# Patient Record
Sex: Female | Born: 1960 | Race: White | Hispanic: No | Marital: Married | Smoking: Current some day smoker
Health system: Southern US, Community
[De-identification: ages and names within clinical notes are randomized; demographics above are authoritative.]

## PROBLEM LIST (undated history)

## (undated) DIAGNOSIS — F329 Major depressive disorder, single episode, unspecified: Secondary | ICD-10-CM

## (undated) DIAGNOSIS — C801 Malignant (primary) neoplasm, unspecified: Secondary | ICD-10-CM

## (undated) DIAGNOSIS — F32A Depression, unspecified: Secondary | ICD-10-CM

## (undated) HISTORY — PX: APPENDECTOMY: SHX54

## (undated) HISTORY — PX: CHOLECYSTECTOMY: SHX55

## (undated) HISTORY — PX: TONSILLECTOMY: SUR1361

## (undated) HISTORY — PX: ABDOMINAL HYSTERECTOMY: SHX81

## (undated) HISTORY — PX: MANDIBLE SURGERY: SHX707

---

## 2017-07-13 ENCOUNTER — Encounter (HOSPITAL_BASED_OUTPATIENT_CLINIC_OR_DEPARTMENT_OTHER): Payer: Self-pay | Admitting: Emergency Medicine

## 2017-07-13 ENCOUNTER — Other Ambulatory Visit: Payer: Self-pay

## 2017-07-13 ENCOUNTER — Emergency Department (HOSPITAL_BASED_OUTPATIENT_CLINIC_OR_DEPARTMENT_OTHER)
Admission: EM | Admit: 2017-07-13 | Discharge: 2017-07-13 | Disposition: A | Discharge status: Home / Self Care | Payer: Medicare Other | Attending: Emergency Medicine | Admitting: Emergency Medicine

## 2017-07-13 ENCOUNTER — Emergency Department (HOSPITAL_BASED_OUTPATIENT_CLINIC_OR_DEPARTMENT_OTHER): Payer: Medicare Other

## 2017-07-13 DIAGNOSIS — R1084 Generalized abdominal pain: Secondary | ICD-10-CM

## 2017-07-13 DIAGNOSIS — Z79899 Other long term (current) drug therapy: Secondary | ICD-10-CM | POA: Diagnosis not present

## 2017-07-13 DIAGNOSIS — M545 Low back pain, unspecified: Secondary | ICD-10-CM

## 2017-07-13 DIAGNOSIS — Z85819 Personal history of malignant neoplasm of unspecified site of lip, oral cavity, and pharynx: Secondary | ICD-10-CM | POA: Diagnosis not present

## 2017-07-13 DIAGNOSIS — F17200 Nicotine dependence, unspecified, uncomplicated: Secondary | ICD-10-CM | POA: Diagnosis not present

## 2017-07-13 HISTORY — DX: Major depressive disorder, single episode, unspecified: F32.9

## 2017-07-13 HISTORY — DX: Malignant (primary) neoplasm, unspecified: C80.1

## 2017-07-13 HISTORY — DX: Depression, unspecified: F32.A

## 2017-07-13 LAB — URINALYSIS, ROUTINE W REFLEX MICROSCOPIC
Bilirubin Urine: NEGATIVE
Glucose, UA: NEGATIVE mg/dL
Hgb urine dipstick: NEGATIVE
KETONES UR: NEGATIVE mg/dL
LEUKOCYTES UA: NEGATIVE
NITRITE: NEGATIVE
PH: 6 (ref 5.0–8.0)
Protein, ur: NEGATIVE mg/dL
SPECIFIC GRAVITY, URINE: 1.025 (ref 1.005–1.030)

## 2017-07-13 LAB — LIPASE, BLOOD: Lipase: 20 U/L (ref 11–51)

## 2017-07-13 LAB — COMPREHENSIVE METABOLIC PANEL
ALBUMIN: 3.8 g/dL (ref 3.5–5.0)
ALK PHOS: 94 U/L (ref 38–126)
ALT: 19 U/L (ref 14–54)
ANION GAP: 7 (ref 5–15)
AST: 21 U/L (ref 15–41)
BILIRUBIN TOTAL: 0.5 mg/dL (ref 0.3–1.2)
BUN: 13 mg/dL (ref 6–20)
CALCIUM: 8.6 mg/dL — AB (ref 8.9–10.3)
CO2: 26 mmol/L (ref 22–32)
Chloride: 105 mmol/L (ref 101–111)
Creatinine, Ser: 0.74 mg/dL (ref 0.44–1.00)
GFR calc Af Amer: >60 mL/min (ref >60)
GFR calc non Af Amer: >60 mL/min (ref >60)
GLUCOSE: 92 mg/dL (ref 65–99)
Potassium: 4 mmol/L (ref 3.5–5.1)
Sodium: 138 mmol/L (ref 135–145)
TOTAL PROTEIN: 7 g/dL (ref 6.5–8.1)

## 2017-07-13 LAB — CBC WITH DIFFERENTIAL/PLATELET
BASOS PCT: 1 %
Basophils Absolute: 0.1 10*3/uL (ref 0.0–0.1)
Eosinophils Absolute: 0.2 10*3/uL (ref 0.0–0.7)
Eosinophils Relative: 2 %
HEMATOCRIT: 39.2 % (ref 36.0–46.0)
HEMOGLOBIN: 12.5 g/dL (ref 12.0–15.0)
LYMPHS ABS: 2.4 10*3/uL (ref 0.7–4.0)
LYMPHS PCT: 32 %
MCH: 26.7 pg (ref 26.0–34.0)
MCHC: 31.9 g/dL (ref 30.0–36.0)
MCV: 83.6 fL (ref 78.0–100.0)
MONOS PCT: 7 %
Monocytes Absolute: 0.5 10*3/uL (ref 0.1–1.0)
NEUTROS ABS: 4.6 10*3/uL (ref 1.7–7.7)
NEUTROS PCT: 58 %
Platelets: 315 10*3/uL (ref 150–400)
RBC: 4.69 MIL/uL (ref 3.87–5.11)
RDW: 13.4 % (ref 11.5–15.5)
WBC: 7.7 10*3/uL (ref 4.0–10.5)

## 2017-07-13 MED ORDER — TRAMADOL HCL 50 MG PO TABS: 50.0000 mg | ORAL_TABLET | Freq: Four times a day (QID) | ORAL | 0 refills | Status: AC | PRN

## 2017-07-13 MED ORDER — HYDROMORPHONE HCL 1 MG/ML IJ SOLN
0.5000 mg | Freq: Once | INTRAMUSCULAR | Status: AC
  Administered 2017-07-13: 0.5 mg via INTRAVENOUS
  Filled 2017-07-13: qty 1

## 2017-07-13 MED ORDER — SODIUM CHLORIDE 0.9 % IV BOLUS (SEPSIS)
500.0000 mL | Freq: Once | INTRAVENOUS | Status: AC
  Administered 2017-07-13: 500 mL via INTRAVENOUS

## 2017-07-13 MED ORDER — ONDANSETRON HCL 4 MG/2ML IJ SOLN
4.0000 mg | Freq: Once | INTRAMUSCULAR | Status: AC
  Administered 2017-07-13: 4 mg via INTRAVENOUS
  Filled 2017-07-13: qty 2

## 2017-07-13 MED ORDER — IOPAMIDOL (ISOVUE-300) INJECTION 61%
100.0000 mL | Freq: Once | INTRAVENOUS | Status: AC | PRN
  Administered 2017-07-13: 100 mL via INTRAVENOUS

## 2017-07-13 NOTE — ED Triage Notes (Signed)
Patient reports that she started to have lower back pain about noon yesterday  - the patient reports that she woke up this am and she has the pain concentrated on her right lower back to her right lower abdominal regions. Patient denies any n/V  - reports that she has decreased urine output this am however no burning

## 2017-07-13 NOTE — Discharge Instructions (Signed)
Please read and follow all provided instructions.  Your diagnoses today include:  1. Generalized abdominal pain   2. Acute right-sided low back pain without sciatica     Tests performed today include:  Blood counts and electrolytes  Blood tests to check liver and kidney function  Blood tests to check pancreas function  Urine test to look for infection  CT scan - no acute problems in your abdomen  Vital signs. See below for your results today.   Medications prescribed:   Tramadol - narcotic-like pain medication  DO NOT drive or perform any activities that require you to be awake and alert because this medicine can make you drowsy.   Take any prescribed medications only as directed.  Home care instructions:   Follow any educational materials contained in this packet.  Follow-up instructions: Please follow-up with your primary care provider in the next 3 days for further evaluation of your symptoms.    Return instructions:  SEEK IMMEDIATE MEDICAL ATTENTION IF:  The pain does not go away or becomes severe   A temperature above 101F develops   Repeated vomiting occurs (multiple episodes)   The pain becomes localized to portions of the abdomen. The right side could possibly be appendicitis. In an adult, the left lower portion of the abdomen could be colitis or diverticulitis.   Blood is being passed in stools or vomit (bright red or black tarry stools)   You develop chest pain, difficulty breathing, dizziness or fainting, or become confused, poorly responsive, or inconsolable (young children)  If you have any other emergent concerns regarding your health  Additional Information: Abdominal (belly) pain can be caused by many things. Your caregiver performed an examination and possibly ordered blood/urine tests and imaging (CT scan, x-rays, ultrasound). Many cases can be observed and treated at home after initial evaluation in the emergency department. Even though you are  being discharged home, abdominal pain can be unpredictable. Therefore, you need a repeated exam if your pain does not resolve, returns, or worsens. Most patients with abdominal pain don't have to be admitted to the hospital or have surgery, but serious problems like appendicitis and gallbladder attacks can start out as nonspecific pain. Many abdominal conditions cannot be diagnosed in one visit, so follow-up evaluations are very important.  Your vital signs today were: BP 135/65 (BP Location: Right Arm)    Pulse 75    Temp 97.6 F (36.4 C) (Oral)    Resp 18    Ht 5' (1.524 m)    Wt 54.4 kg (120 lb)    SpO2 100%    BMI 23.44 kg/m  If your blood pressure (bp) was elevated above 135/85 this visit, please have this repeated by your doctor within one month. --------------

## 2017-07-13 NOTE — ED Provider Notes (Signed)
[facility] EMERGENCY DEPARTMENT Provider Note   CSN: [id number] Arrival date & time: 07/13/17  2423     History   Chief Complaint Chief Complaint  Patient presents with  . Back Pain    HPI [name] is a 57 y.o. female.  Patient with history of cholecystectomy, appendectomy, hysterectomy --presents to the emergency department today with complaint of right lower back pain with radiation into her abdomen.  Pain is across her entire abdomen but worse on the right.  She has had no associated fevers, nausea, vomiting, constipation.  Patient notes some loose nonbloody stools today.  No urinary symptoms including blood or pain.  No vaginal bleeding or discharge.  She has not had pain like this in the past.  Pain is severe at times.  She has been taking Tylenol at home without relief. The onset of this condition was acute. The course is constant. Aggravating factors: movement and palpation. Alleviating factors: none.        Past Medical History:  Diagnosis Date  . 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 Depression     There are no active problems to display for this patient.   Past Surgical History:  Procedure Laterality Date  . ABDOMINAL HYSTERECTOMY    . APPENDECTOMY    . CHOLECYSTECTOMY    . MANDIBLE SURGERY    . TONSILLECTOMY      OB History    No data available       Home Medications    Prior to Admission medications   Medication Sig Start Date End Date Taking? Authorizing Provider  clonazePAM (KLONOPIN) 1 MG tablet Take 1 mg by mouth 2 (two) times daily.   Yes [provider]  desvenlafaxine (PRISTIQ) 50 MG 24 hr tablet Take 50 mg by mouth daily.   Yes [provider]  temazepam (RESTORIL) 15 MG capsule Take 30 mg by mouth at bedtime as needed for sleep.   Yes [provider]    Family History History reviewed. No pertinent family history.  Social History Social History   Tobacco Use  . Smoking status: Current Some Day  Smoker  . Smokeless tobacco: Never Used  Substance Use Topics  . Alcohol use: No    Frequency: Never  . Drug use: No     Allergies   Sulfa antibiotics and Codeine   Review of Systems Review of Systems  Constitutional: Negative for fever.  HENT: Negative for rhinorrhea and sore throat.   Eyes: Negative for redness.  Respiratory: Negative for cough.   Cardiovascular: Negative for chest pain.  Gastrointestinal: Positive for abdominal pain and diarrhea. Negative for blood in stool, nausea and vomiting.  Genitourinary: Negative for dysuria.  Musculoskeletal: Positive for back pain. Negative for myalgias.  Skin: Negative for rash.  Neurological: Negative for headaches.     Physical Exam Updated Vital Signs BP 135/65 (BP Location: Right Arm)   Pulse 75   Temp 97.6 F (36.4 C) (Oral)   Resp 18   Ht 5' (1.524 m)   Wt 54.4 kg (120 lb)   SpO2 100%   BMI 23.44 kg/m   Physical Exam  Constitutional: She appears well-developed and well-nourished.  HENT:  Head: Normocephalic and atraumatic.  Mouth/Throat: Oropharynx is clear and moist.  Eyes: Conjunctivae are normal. Right eye exhibits no discharge. Left eye exhibits no discharge.  Neck: Normal range of motion. Neck supple.  Cardiovascular: Normal rate, regular rhythm and normal heart sounds.  No murmur heard. Pulmonary/Chest:  Effort normal and breath sounds normal. No stridor. No respiratory distress. She has no wheezes.  Abdominal: Soft. She exhibits no mass. There is tenderness. There is no guarding.  Tenderness is generalized but patient reports it seems to be worse in the right lower quadrant.  No rebound or guarding.  Tenderness is mild to moderate.  Neurological: She is alert.  Skin: Skin is warm and dry.  Psychiatric: She has a normal mood and affect.  Nursing note and vitals reviewed.    ED Treatments / Results  Labs (all labs ordered are listed, but only abnormal results are displayed) Labs Reviewed    COMPREHENSIVE METABOLIC PANEL - Abnormal; Notable for the following components:      Result Value   Calcium 8.6 (*)    All other components within normal limits  URINALYSIS, ROUTINE W REFLEX MICROSCOPIC  CBC WITH DIFFERENTIAL/PLATELET  LIPASE, BLOOD    EKG  EKG Interpretation None       Radiology Ct Abdomen Pelvis W Contrast  Result Date: 07/13/2017 CLINICAL DATA:  Right side abdominal pain, flank pain EXAM: CT ABDOMEN AND PELVIS WITH CONTRAST TECHNIQUE: Multidetector CT imaging of the abdomen and pelvis was performed using the standard protocol following bolus administration of intravenous contrast. CONTRAST:  135mL ISOVUE-300 IOPAMIDOL (ISOVUE-300) INJECTION 61% COMPARISON:  None. FINDINGS: Lower chest: No acute abnormality. Hepatobiliary: Prior cholecystectomy. Mildly prominent intrahepatic and extrahepatic biliary ducts compatible with post cholecystectomy state. No focal hepatic abnormality. Pancreas: No focal abnormality or ductal dilatation. Spleen: No focal abnormality.  Normal size. Adrenals/Urinary Tract: No adrenal abnormality. No focal renal abnormality. No stones or hydronephrosis. Urinary bladder is unremarkable. Stomach/Bowel: Stomach, large and small bowel grossly unremarkable. Vascular/Lymphatic: No evidence of aneurysm or adenopathy. Reproductive: Prior hysterectomy.  No adnexal masses. Other: No free fluid or free air. Musculoskeletal: No acute bony abnormality. IMPRESSION: Prior cholecystectomy, appendectomy and hysterectomy. No acute findings in the abdomen or pelvis. Electronically Signed   By: Rolm Baptise M.D.   On: 07/13/2017 10:31    Procedures Procedures (including critical care time)  Medications Ordered in ED Medications  HYDROmorphone (DILAUDID) injection 0.5 mg (not administered)  HYDROmorphone (DILAUDID) injection 0.5 mg (0.5 mg Intravenous Given 07/13/17 0931)  ondansetron (ZOFRAN) injection 4 mg (4 mg Intravenous Given 07/13/17 0931)  sodium chloride  0.9 % bolus 500 mL (0 mLs Intravenous Stopped 07/13/17 1021)  iopamidol (ISOVUE-300) 61 % injection 100 mL (100 mLs Intravenous Contrast Given 07/13/17 1009)     Initial Impression / Assessment and Plan / ED Course  I have reviewed the triage vital signs and the nursing notes.  Pertinent labs & imaging results that were available during my care of the patient were reviewed by me and considered in my medical decision making (see chart for details).     Patient seen and examined.  Patient with previous appendectomy and cholecystectomy.  She is not pregnant due to previous hysterectomy.  Pain is nonlocalized and patient is tender across her abdomen.  CT imaging ordered to further delineate given nonfocal exam.  Pain began in the back, however patient's abdominal tenderness would be atypical for a lower back injury.  Labs pending.  Vital signs reviewed and are as follows: BP 135/65 (BP Location: Right Arm)   Pulse 75   Temp 97.6 F (36.4 C) (Oral)   Resp 18   Ht 5' (1.524 m)   Wt 54.4 kg (120 lb)   SpO2 100%   BMI 23.44 kg/m   10:46 AM patient updated on  lab and CT results.  Workup is reassuring. She continues to have some pain. Another 0.5mg  dilaudid prior to discharge. Home with tramadol. Reviewed database, mainly benzo rx. She has had tramadol in the past.   The patient was urged to return to the Emergency Department immediately with worsening of current symptoms, worsening abdominal pain, persistent vomiting, blood noted in stools, fever, or any other concerns. The patient verbalized understanding.   Encouraged primary care follow-up for recheck in the next 2-3 days.  Patient counseled on use of narcotic pain medications. Counseled not to combine these medications with others containing tylenol. Urged not to drink alcohol, drive, or perform any other activities that requires focus while taking these medications. The patient verbalizes understanding and agrees with the plan.   Final  Clinical Impressions(s) / ED Diagnoses   Final diagnoses:  Generalized abdominal pain  Acute right-sided low back pain without sciatica   Patient with abdominal pain and back pain. Vitals are stable, no fever. Labs reassuring. Imaging negative. Possibly MSK pain with atypical features. No signs of dehydration, patient is tolerating PO's. Lungs are clear and no signs suggestive of PNA. Low concern for appendicitis, cholecystitis (previous sx), pancreatitis, ruptured viscus, UTI, kidney stone, aortic dissection, aortic aneurysm or other emergent abdominal etiology. Supportive therapy indicated with return if symptoms worsen.    ED Discharge Orders        Ordered    traMADol (ULTRAM) 50 MG tablet  Every 6 hours PRN     07/13/17 1043       Carlisle Cater, PA-C 07/13/17 1048    Drenda Freeze, MD 07/13/17 1440

## 2018-05-11 DIAGNOSIS — E872 Acidosis: Secondary | ICD-10-CM | POA: Diagnosis not present

## 2018-05-11 DIAGNOSIS — G8918 Other acute postprocedural pain: Secondary | ICD-10-CM | POA: Diagnosis not present

## 2018-05-11 DIAGNOSIS — Z7982 Long term (current) use of aspirin: Secondary | ICD-10-CM | POA: Diagnosis not present

## 2018-05-11 DIAGNOSIS — C069 Malignant neoplasm of mouth, unspecified: Secondary | ICD-10-CM | POA: Diagnosis not present

## 2018-05-11 DIAGNOSIS — Z4682 Encounter for fitting and adjustment of non-vascular catheter: Secondary | ICD-10-CM | POA: Diagnosis not present

## 2018-05-11 DIAGNOSIS — Z9889 Other specified postprocedural states: Secondary | ICD-10-CM | POA: Diagnosis not present

## 2018-05-11 DIAGNOSIS — Z885 Allergy status to narcotic agent status: Secondary | ICD-10-CM | POA: Diagnosis not present

## 2018-05-11 DIAGNOSIS — I4581 Long QT syndrome: Secondary | ICD-10-CM | POA: Diagnosis not present

## 2018-05-11 DIAGNOSIS — R739 Hyperglycemia, unspecified: Secondary | ICD-10-CM | POA: Diagnosis not present

## 2018-05-11 DIAGNOSIS — Z85819 Personal history of malignant neoplasm of unspecified site of lip, oral cavity, and pharynx: Secondary | ICD-10-CM | POA: Diagnosis not present

## 2018-05-11 DIAGNOSIS — C001 Malignant neoplasm of external lower lip: Secondary | ICD-10-CM | POA: Diagnosis not present

## 2018-05-11 DIAGNOSIS — Z882 Allergy status to sulfonamides status: Secondary | ICD-10-CM | POA: Diagnosis not present

## 2018-05-11 DIAGNOSIS — D649 Anemia, unspecified: Secondary | ICD-10-CM | POA: Diagnosis not present

## 2018-05-11 DIAGNOSIS — J9811 Atelectasis: Secondary | ICD-10-CM | POA: Diagnosis not present

## 2018-05-11 DIAGNOSIS — Z87891 Personal history of nicotine dependence: Secondary | ICD-10-CM | POA: Diagnosis not present

## 2018-05-11 DIAGNOSIS — R1311 Dysphagia, oral phase: Secondary | ICD-10-CM | POA: Diagnosis not present

## 2018-05-11 DIAGNOSIS — Z85818 Personal history of malignant neoplasm of other sites of lip, oral cavity, and pharynx: Secondary | ICD-10-CM | POA: Diagnosis not present

## 2018-05-11 DIAGNOSIS — R9431 Abnormal electrocardiogram [ECG] [EKG]: Secondary | ICD-10-CM | POA: Diagnosis not present

## 2018-05-11 DIAGNOSIS — R0689 Other abnormalities of breathing: Secondary | ICD-10-CM | POA: Diagnosis not present

## 2018-05-11 DIAGNOSIS — Z01818 Encounter for other preprocedural examination: Secondary | ICD-10-CM | POA: Diagnosis not present

## 2018-05-11 DIAGNOSIS — R6510 Systemic inflammatory response syndrome (SIRS) of non-infectious origin without acute organ dysfunction: Secondary | ICD-10-CM | POA: Diagnosis not present

## 2018-05-11 DIAGNOSIS — Z9049 Acquired absence of other specified parts of digestive tract: Secondary | ICD-10-CM | POA: Diagnosis not present

## 2018-05-11 DIAGNOSIS — K219 Gastro-esophageal reflux disease without esophagitis: Secondary | ICD-10-CM | POA: Diagnosis not present

## 2018-05-11 DIAGNOSIS — K130 Diseases of lips: Secondary | ICD-10-CM | POA: Diagnosis not present

## 2018-05-11 DIAGNOSIS — M952 Other acquired deformity of head: Secondary | ICD-10-CM | POA: Diagnosis not present

## 2018-05-19 DIAGNOSIS — Z87891 Personal history of nicotine dependence: Secondary | ICD-10-CM | POA: Diagnosis not present

## 2018-05-19 DIAGNOSIS — Z8719 Personal history of other diseases of the digestive system: Secondary | ICD-10-CM | POA: Diagnosis not present

## 2018-05-19 DIAGNOSIS — Z882 Allergy status to sulfonamides status: Secondary | ICD-10-CM | POA: Diagnosis not present

## 2018-05-19 DIAGNOSIS — K130 Diseases of lips: Secondary | ICD-10-CM | POA: Diagnosis not present

## 2018-05-19 DIAGNOSIS — Z885 Allergy status to narcotic agent status: Secondary | ICD-10-CM | POA: Diagnosis not present

## 2018-06-03 DIAGNOSIS — Z882 Allergy status to sulfonamides status: Secondary | ICD-10-CM | POA: Diagnosis not present

## 2018-06-03 DIAGNOSIS — Z885 Allergy status to narcotic agent status: Secondary | ICD-10-CM | POA: Diagnosis not present

## 2018-06-03 DIAGNOSIS — Z7951 Long term (current) use of inhaled steroids: Secondary | ICD-10-CM | POA: Diagnosis not present

## 2018-06-03 DIAGNOSIS — M79605 Pain in left leg: Secondary | ICD-10-CM | POA: Diagnosis not present

## 2018-06-03 DIAGNOSIS — T8141XA Infection following a procedure, superficial incisional surgical site, initial encounter: Secondary | ICD-10-CM | POA: Diagnosis not present

## 2018-06-03 DIAGNOSIS — Z87891 Personal history of nicotine dependence: Secondary | ICD-10-CM | POA: Diagnosis not present

## 2018-06-03 DIAGNOSIS — T148XXA Other injury of unspecified body region, initial encounter: Secondary | ICD-10-CM | POA: Diagnosis not present

## 2018-06-03 DIAGNOSIS — Z79899 Other long term (current) drug therapy: Secondary | ICD-10-CM | POA: Diagnosis not present

## 2018-06-03 DIAGNOSIS — L539 Erythematous condition, unspecified: Secondary | ICD-10-CM | POA: Diagnosis not present

## 2018-06-03 DIAGNOSIS — Z85828 Personal history of other malignant neoplasm of skin: Secondary | ICD-10-CM | POA: Diagnosis not present

## 2018-06-03 DIAGNOSIS — L03116 Cellulitis of left lower limb: Secondary | ICD-10-CM | POA: Diagnosis not present

## 2018-07-01 DIAGNOSIS — J0100 Acute maxillary sinusitis, unspecified: Secondary | ICD-10-CM | POA: Diagnosis not present

## 2018-07-01 DIAGNOSIS — J209 Acute bronchitis, unspecified: Secondary | ICD-10-CM | POA: Diagnosis not present

## 2018-08-27 DIAGNOSIS — Z79899 Other long term (current) drug therapy: Secondary | ICD-10-CM | POA: Diagnosis not present

## 2018-08-27 DIAGNOSIS — Z882 Allergy status to sulfonamides status: Secondary | ICD-10-CM | POA: Diagnosis not present

## 2018-08-27 DIAGNOSIS — Z885 Allergy status to narcotic agent status: Secondary | ICD-10-CM | POA: Diagnosis not present

## 2018-08-27 DIAGNOSIS — K130 Diseases of lips: Secondary | ICD-10-CM | POA: Diagnosis not present

## 2018-08-27 DIAGNOSIS — C009 Malignant neoplasm of lip, unspecified: Secondary | ICD-10-CM | POA: Diagnosis not present

## 2018-08-27 DIAGNOSIS — Z9889 Other specified postprocedural states: Secondary | ICD-10-CM | POA: Diagnosis not present

## 2018-11-25 DIAGNOSIS — Z85819 Personal history of malignant neoplasm of unspecified site of lip, oral cavity, and pharynx: Secondary | ICD-10-CM | POA: Diagnosis not present

## 2018-11-25 DIAGNOSIS — Z01818 Encounter for other preprocedural examination: Secondary | ICD-10-CM | POA: Diagnosis not present

## 2018-11-25 DIAGNOSIS — R1311 Dysphagia, oral phase: Secondary | ICD-10-CM | POA: Diagnosis not present

## 2018-11-25 DIAGNOSIS — M952 Other acquired deformity of head: Secondary | ICD-10-CM | POA: Diagnosis not present

## 2018-12-01 DIAGNOSIS — Z419 Encounter for procedure for purposes other than remedying health state, unspecified: Secondary | ICD-10-CM | POA: Diagnosis not present

## 2018-12-04 DIAGNOSIS — Z7289 Other problems related to lifestyle: Secondary | ICD-10-CM | POA: Diagnosis not present

## 2018-12-04 DIAGNOSIS — Z87891 Personal history of nicotine dependence: Secondary | ICD-10-CM | POA: Diagnosis not present

## 2018-12-04 DIAGNOSIS — Z79899 Other long term (current) drug therapy: Secondary | ICD-10-CM | POA: Diagnosis not present

## 2018-12-04 DIAGNOSIS — M952 Other acquired deformity of head: Secondary | ICD-10-CM | POA: Diagnosis not present

## 2018-12-04 DIAGNOSIS — K130 Diseases of lips: Secondary | ICD-10-CM | POA: Diagnosis not present

## 2018-12-04 DIAGNOSIS — Z9889 Other specified postprocedural states: Secondary | ICD-10-CM | POA: Diagnosis not present

## 2018-12-04 DIAGNOSIS — M278 Other specified diseases of jaws: Secondary | ICD-10-CM | POA: Diagnosis not present

## 2018-12-04 DIAGNOSIS — C009 Malignant neoplasm of lip, unspecified: Secondary | ICD-10-CM | POA: Diagnosis not present

## 2018-12-04 DIAGNOSIS — R1311 Dysphagia, oral phase: Secondary | ICD-10-CM | POA: Diagnosis not present

## 2018-12-04 DIAGNOSIS — Z85819 Personal history of malignant neoplasm of unspecified site of lip, oral cavity, and pharynx: Secondary | ICD-10-CM | POA: Diagnosis not present

## 2018-12-04 DIAGNOSIS — L7682 Other postprocedural complications of skin and subcutaneous tissue: Secondary | ICD-10-CM | POA: Diagnosis not present

## 2018-12-05 DIAGNOSIS — Z9889 Other specified postprocedural states: Secondary | ICD-10-CM | POA: Diagnosis not present

## 2018-12-05 DIAGNOSIS — Z79899 Other long term (current) drug therapy: Secondary | ICD-10-CM | POA: Diagnosis not present

## 2018-12-05 DIAGNOSIS — Z87891 Personal history of nicotine dependence: Secondary | ICD-10-CM | POA: Diagnosis not present

## 2018-12-05 DIAGNOSIS — M278 Other specified diseases of jaws: Secondary | ICD-10-CM | POA: Diagnosis not present

## 2018-12-05 DIAGNOSIS — Z7289 Other problems related to lifestyle: Secondary | ICD-10-CM | POA: Diagnosis not present

## 2018-12-05 DIAGNOSIS — M952 Other acquired deformity of head: Secondary | ICD-10-CM | POA: Diagnosis not present

## 2018-12-05 DIAGNOSIS — K130 Diseases of lips: Secondary | ICD-10-CM | POA: Diagnosis not present

## 2018-12-05 DIAGNOSIS — L7682 Other postprocedural complications of skin and subcutaneous tissue: Secondary | ICD-10-CM | POA: Diagnosis not present

## 2018-12-30 DIAGNOSIS — J0100 Acute maxillary sinusitis, unspecified: Secondary | ICD-10-CM | POA: Diagnosis not present

## 2018-12-30 DIAGNOSIS — J029 Acute pharyngitis, unspecified: Secondary | ICD-10-CM | POA: Diagnosis not present

## 2019-02-17 DIAGNOSIS — Z01818 Encounter for other preprocedural examination: Secondary | ICD-10-CM | POA: Diagnosis not present

## 2019-02-23 DIAGNOSIS — Z01818 Encounter for other preprocedural examination: Secondary | ICD-10-CM | POA: Diagnosis not present

## 2019-02-26 DIAGNOSIS — Z9889 Other specified postprocedural states: Secondary | ICD-10-CM | POA: Diagnosis not present

## 2019-02-26 DIAGNOSIS — Z85819 Personal history of malignant neoplasm of unspecified site of lip, oral cavity, and pharynx: Secondary | ICD-10-CM | POA: Diagnosis not present

## 2019-02-26 DIAGNOSIS — Z85828 Personal history of other malignant neoplasm of skin: Secondary | ICD-10-CM | POA: Diagnosis not present

## 2019-02-26 DIAGNOSIS — Z428 Encounter for other plastic and reconstructive surgery following medical procedure or healed injury: Secondary | ICD-10-CM | POA: Diagnosis not present

## 2019-02-26 DIAGNOSIS — K130 Diseases of lips: Secondary | ICD-10-CM | POA: Diagnosis not present

## 2019-02-26 DIAGNOSIS — R1311 Dysphagia, oral phase: Secondary | ICD-10-CM | POA: Diagnosis not present

## 2019-02-26 DIAGNOSIS — M953 Acquired deformity of neck: Secondary | ICD-10-CM | POA: Diagnosis not present

## 2019-02-26 DIAGNOSIS — M2689 Other dentofacial anomalies: Secondary | ICD-10-CM | POA: Diagnosis not present

## 2019-02-26 DIAGNOSIS — M952 Other acquired deformity of head: Secondary | ICD-10-CM | POA: Diagnosis not present

## 2019-06-10 ENCOUNTER — Institutional Professional Consult (permissible substitution): Payer: Medicare Other | Admitting: Plastic Surgery

## 2019-06-21 DIAGNOSIS — J0100 Acute maxillary sinusitis, unspecified: Secondary | ICD-10-CM | POA: Diagnosis not present

## 2019-06-21 DIAGNOSIS — H9201 Otalgia, right ear: Secondary | ICD-10-CM | POA: Diagnosis not present

## 2019-06-22 DIAGNOSIS — J0100 Acute maxillary sinusitis, unspecified: Secondary | ICD-10-CM | POA: Diagnosis not present

## 2019-07-06 DIAGNOSIS — J029 Acute pharyngitis, unspecified: Secondary | ICD-10-CM | POA: Diagnosis not present

## 2019-07-06 DIAGNOSIS — R05 Cough: Secondary | ICD-10-CM | POA: Diagnosis not present

## 2019-07-06 DIAGNOSIS — Z03818 Encounter for observation for suspected exposure to other biological agents ruled out: Secondary | ICD-10-CM | POA: Diagnosis not present

## 2019-07-06 DIAGNOSIS — H9201 Otalgia, right ear: Secondary | ICD-10-CM | POA: Diagnosis not present

## 2019-07-24 DIAGNOSIS — Z85819 Personal history of malignant neoplasm of unspecified site of lip, oral cavity, and pharynx: Secondary | ICD-10-CM | POA: Diagnosis not present

## 2019-07-24 DIAGNOSIS — Z85818 Personal history of malignant neoplasm of other sites of lip, oral cavity, and pharynx: Secondary | ICD-10-CM | POA: Diagnosis not present

## 2019-07-27 DIAGNOSIS — K1329 Other disturbances of oral epithelium, including tongue: Secondary | ICD-10-CM | POA: Diagnosis not present

## 2019-08-01 DIAGNOSIS — K1379 Other lesions of oral mucosa: Secondary | ICD-10-CM | POA: Diagnosis not present

## 2019-08-01 DIAGNOSIS — Z87891 Personal history of nicotine dependence: Secondary | ICD-10-CM | POA: Diagnosis not present

## 2019-08-01 DIAGNOSIS — Z79899 Other long term (current) drug therapy: Secondary | ICD-10-CM | POA: Diagnosis not present

## 2019-08-01 DIAGNOSIS — R6884 Jaw pain: Secondary | ICD-10-CM | POA: Diagnosis not present

## 2019-08-01 DIAGNOSIS — Z882 Allergy status to sulfonamides status: Secondary | ICD-10-CM | POA: Diagnosis not present

## 2019-08-01 DIAGNOSIS — Z885 Allergy status to narcotic agent status: Secondary | ICD-10-CM | POA: Diagnosis not present

## 2019-08-23 DIAGNOSIS — M952 Other acquired deformity of head: Secondary | ICD-10-CM | POA: Diagnosis not present

## 2019-09-04 DIAGNOSIS — Z87891 Personal history of nicotine dependence: Secondary | ICD-10-CM | POA: Diagnosis not present

## 2019-09-04 DIAGNOSIS — K0889 Other specified disorders of teeth and supporting structures: Secondary | ICD-10-CM | POA: Diagnosis not present

## 2019-09-04 DIAGNOSIS — Z882 Allergy status to sulfonamides status: Secondary | ICD-10-CM | POA: Diagnosis not present

## 2019-09-04 DIAGNOSIS — Z888 Allergy status to other drugs, medicaments and biological substances status: Secondary | ICD-10-CM | POA: Diagnosis not present

## 2019-09-04 DIAGNOSIS — Z79899 Other long term (current) drug therapy: Secondary | ICD-10-CM | POA: Diagnosis not present

## 2019-09-13 DIAGNOSIS — C031 Malignant neoplasm of lower gum: Secondary | ICD-10-CM | POA: Diagnosis not present

## 2019-09-16 DIAGNOSIS — C030 Malignant neoplasm of upper gum: Secondary | ICD-10-CM | POA: Diagnosis not present

## 2019-09-18 DIAGNOSIS — Z881 Allergy status to other antibiotic agents status: Secondary | ICD-10-CM | POA: Diagnosis not present

## 2019-09-18 DIAGNOSIS — Z87891 Personal history of nicotine dependence: Secondary | ICD-10-CM | POA: Diagnosis not present

## 2019-09-18 DIAGNOSIS — Z79899 Other long term (current) drug therapy: Secondary | ICD-10-CM | POA: Diagnosis not present

## 2019-09-18 DIAGNOSIS — Z888 Allergy status to other drugs, medicaments and biological substances status: Secondary | ICD-10-CM | POA: Diagnosis not present

## 2019-09-18 DIAGNOSIS — Z886 Allergy status to analgesic agent status: Secondary | ICD-10-CM | POA: Diagnosis not present

## 2019-09-18 DIAGNOSIS — R031 Nonspecific low blood-pressure reading: Secondary | ICD-10-CM | POA: Diagnosis not present

## 2019-09-18 DIAGNOSIS — K0889 Other specified disorders of teeth and supporting structures: Secondary | ICD-10-CM | POA: Diagnosis not present

## 2019-09-28 DIAGNOSIS — C411 Malignant neoplasm of mandible: Secondary | ICD-10-CM | POA: Diagnosis not present

## 2019-09-28 DIAGNOSIS — C030 Malignant neoplasm of upper gum: Secondary | ICD-10-CM | POA: Diagnosis not present

## 2019-09-28 DIAGNOSIS — Z9049 Acquired absence of other specified parts of digestive tract: Secondary | ICD-10-CM | POA: Diagnosis not present

## 2019-09-28 DIAGNOSIS — Z803 Family history of malignant neoplasm of breast: Secondary | ICD-10-CM | POA: Diagnosis not present

## 2019-09-28 DIAGNOSIS — Z885 Allergy status to narcotic agent status: Secondary | ICD-10-CM | POA: Diagnosis not present

## 2019-09-28 DIAGNOSIS — Z882 Allergy status to sulfonamides status: Secondary | ICD-10-CM | POA: Diagnosis not present

## 2019-09-28 DIAGNOSIS — Z87891 Personal history of nicotine dependence: Secondary | ICD-10-CM | POA: Diagnosis not present

## 2019-09-28 DIAGNOSIS — R200 Anesthesia of skin: Secondary | ICD-10-CM | POA: Diagnosis not present

## 2019-09-28 DIAGNOSIS — K1370 Unspecified lesions of oral mucosa: Secondary | ICD-10-CM | POA: Diagnosis not present

## 2019-09-29 DIAGNOSIS — C411 Malignant neoplasm of mandible: Secondary | ICD-10-CM | POA: Diagnosis not present

## 2019-10-10 DIAGNOSIS — L661 Lichen planopilaris: Secondary | ICD-10-CM | POA: Diagnosis not present

## 2019-10-10 DIAGNOSIS — C44121 Squamous cell carcinoma of skin of unspecified eyelid, including canthus: Secondary | ICD-10-CM | POA: Diagnosis not present

## 2019-10-10 DIAGNOSIS — C441292 Squamous cell carcinoma of skin of left lower eyelid, including canthus: Secondary | ICD-10-CM | POA: Diagnosis not present

## 2019-10-10 DIAGNOSIS — G893 Neoplasm related pain (acute) (chronic): Secondary | ICD-10-CM | POA: Diagnosis not present

## 2019-10-10 DIAGNOSIS — C039 Malignant neoplasm of gum, unspecified: Secondary | ICD-10-CM | POA: Diagnosis not present

## 2019-10-10 DIAGNOSIS — Z87891 Personal history of nicotine dependence: Secondary | ICD-10-CM | POA: Diagnosis not present

## 2019-10-11 DIAGNOSIS — Z7189 Other specified counseling: Secondary | ICD-10-CM | POA: Diagnosis not present

## 2019-10-13 DIAGNOSIS — Z9049 Acquired absence of other specified parts of digestive tract: Secondary | ICD-10-CM | POA: Diagnosis not present

## 2019-10-13 DIAGNOSIS — K219 Gastro-esophageal reflux disease without esophagitis: Secondary | ICD-10-CM | POA: Diagnosis not present

## 2019-10-13 DIAGNOSIS — C030 Malignant neoplasm of upper gum: Secondary | ICD-10-CM | POA: Diagnosis not present

## 2019-10-13 DIAGNOSIS — G4730 Sleep apnea, unspecified: Secondary | ICD-10-CM | POA: Diagnosis not present

## 2019-10-22 IMAGING — CT CT ABD-PELV W/ CM
2 of 5 series · 16 of 46 positions shown, 18 images · IV contrast (APPLIED)
Comparison: None.

CLINICAL DATA: Right side abdominal pain, flank pain

EXAM:
CT ABDOMEN AND PELVIS WITH CONTRAST
TECHNIQUE: Multidetector CT imaging of the abdomen and pelvis was performed
using the standard protocol following bolus administration of
intravenous contrast.
CONTRAST:  100mL UQF6VL-0SS IOPAMIDOL (UQF6VL-0SS) INJECTION 61%

[Series 2: axial st · axial · 0.68mm/px · z∈[-444,-50]mm · 13 of 89 slices shown, 15 images]
[im 5/89  soft-tissue]
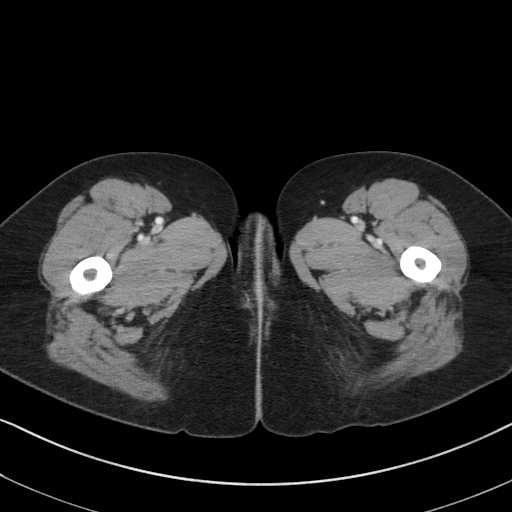
[im 5/89  bone]
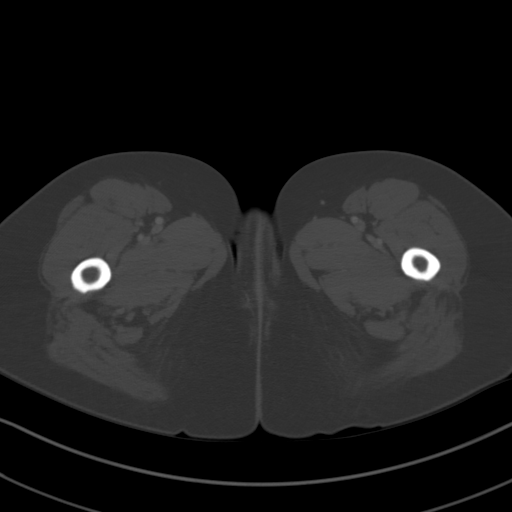
[im 14/89  soft-tissue]
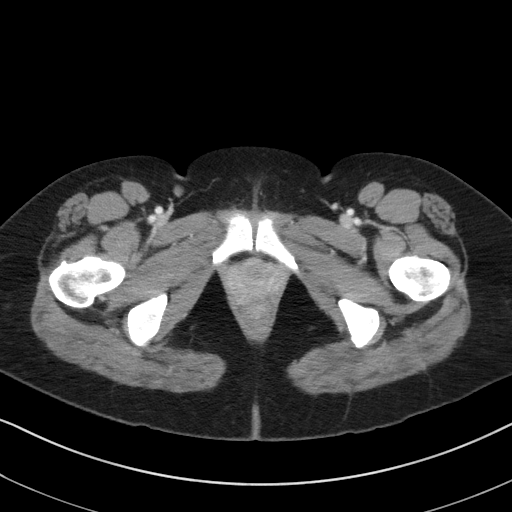
[im 19/89  soft-tissue]
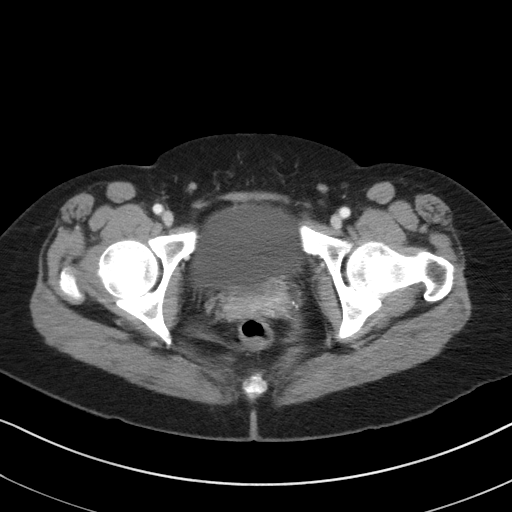
[im 24/89  soft-tissue]
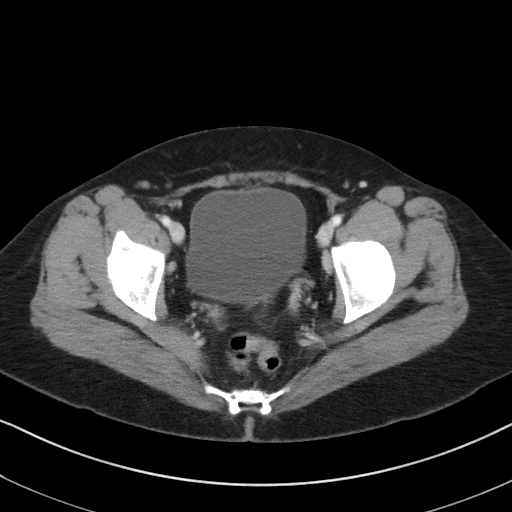
[im 33/89  soft-tissue]
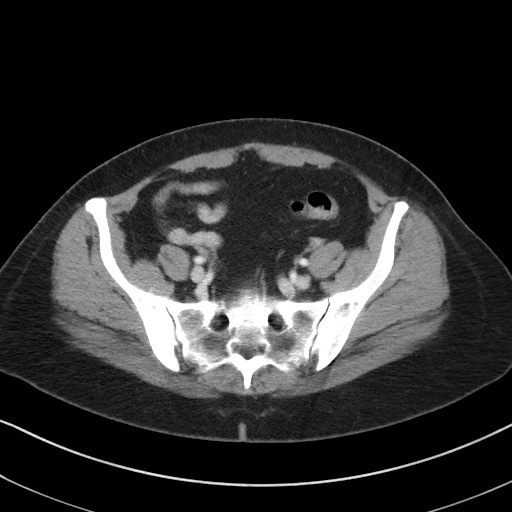
[im 38/89  soft-tissue]
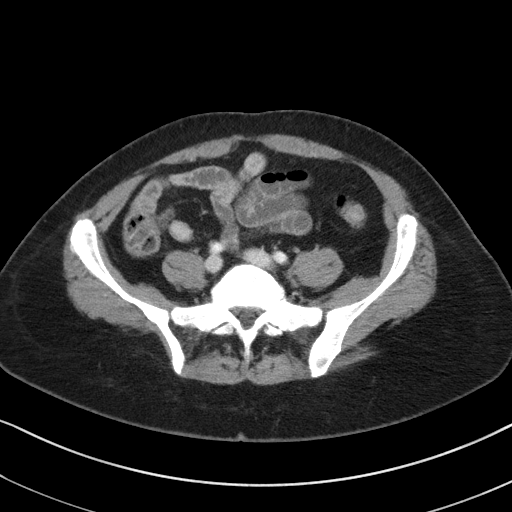
[im 47/89  soft-tissue]
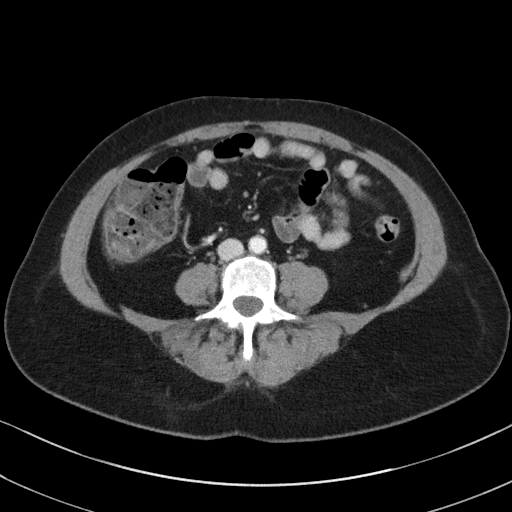
[im 51/89  soft-tissue]
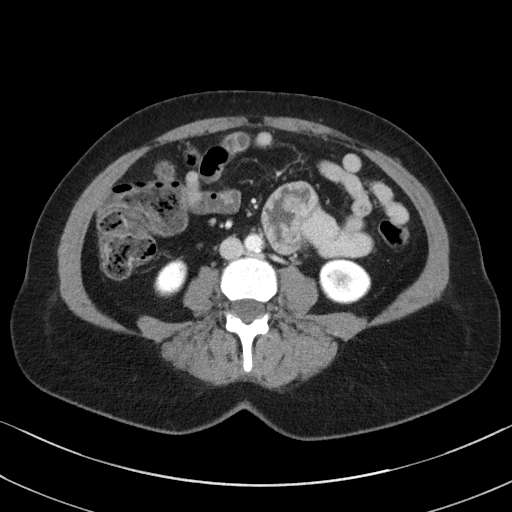
[im 56/89  soft-tissue]
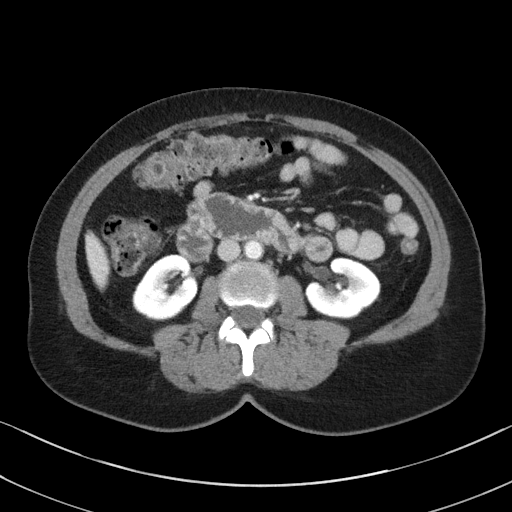
[im 56/89  bone]
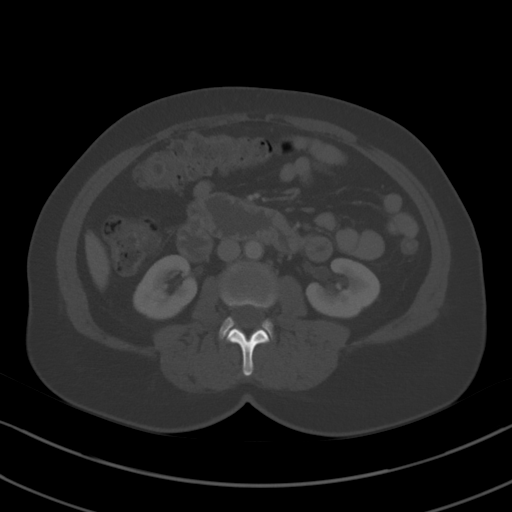
[im 65/89  soft-tissue]
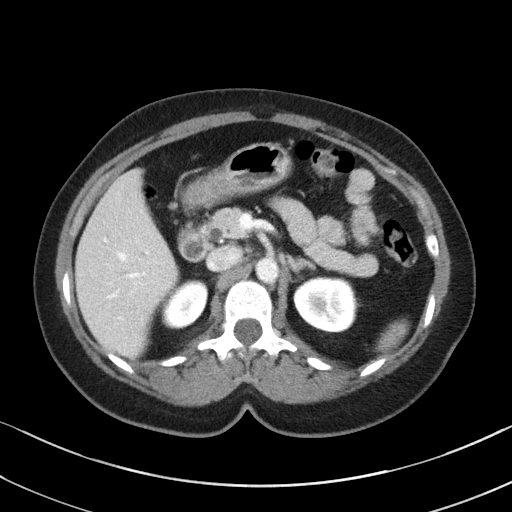
[im 70/89  soft-tissue]
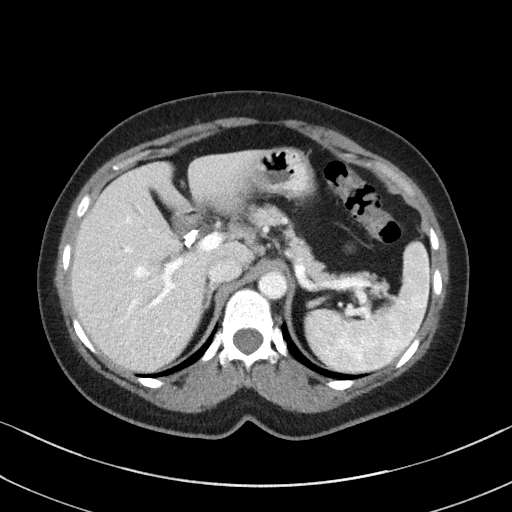
[im 75/89  soft-tissue]
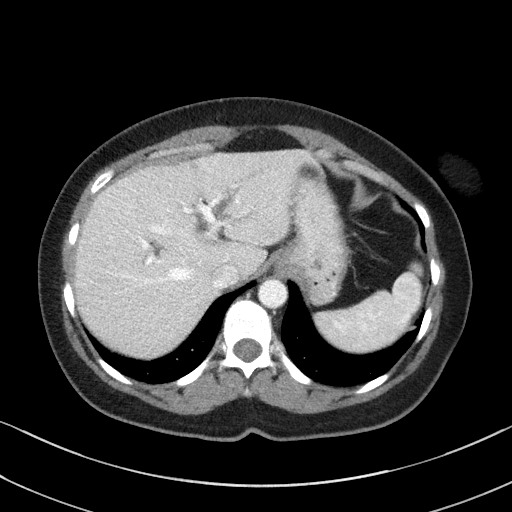
[im 84/89  soft-tissue]
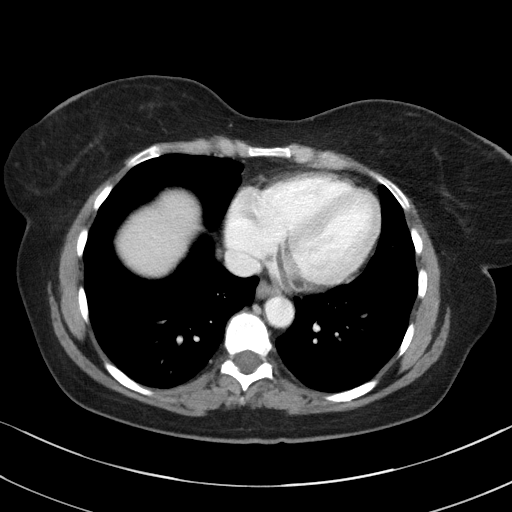

[Series 5: coronal st · coronal · 0.72mm/px · 3 of 75 slices shown]
[im 25/75  soft-tissue]
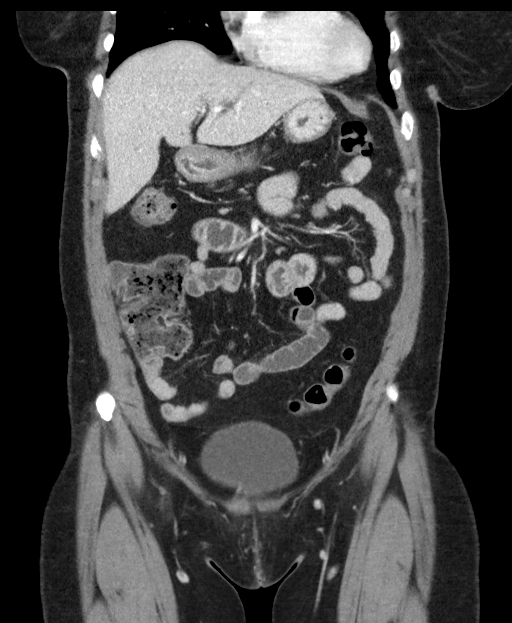
[im 33/75  soft-tissue]
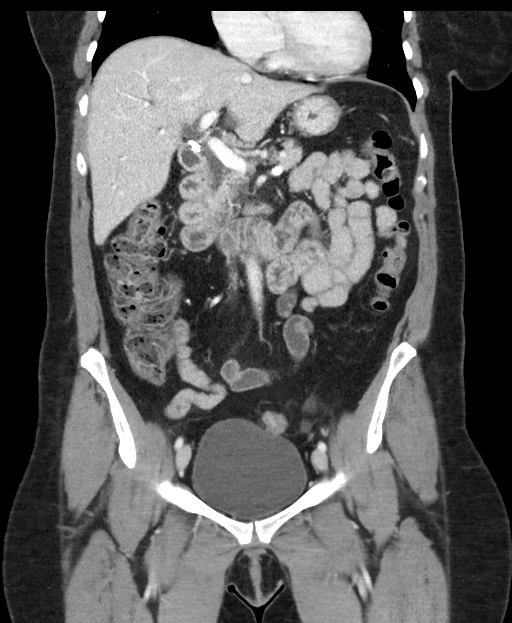
[im 42/75  soft-tissue]
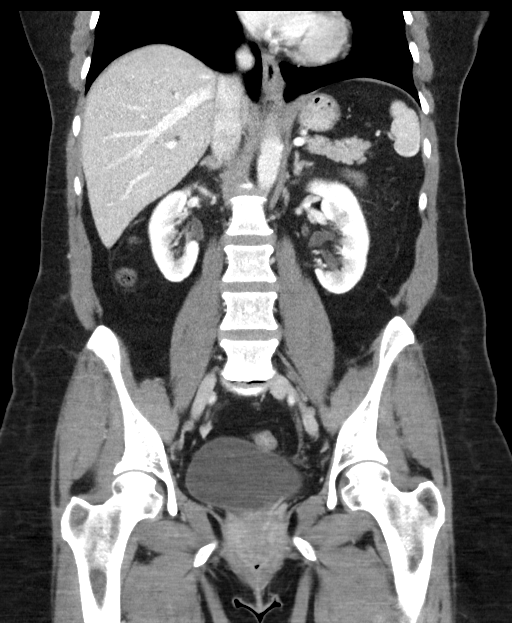

[16 of 46 positions shown; findings below may reference images not displayed]

FINDINGS: Lower chest: No acute abnormality.

Hepatobiliary: Prior cholecystectomy. Mildly prominent intrahepatic
and extrahepatic biliary ducts compatible with post cholecystectomy
state. No focal hepatic abnormality.

Pancreas: No focal abnormality or ductal dilatation.

Spleen: No focal abnormality.  Normal size.

Adrenals/Urinary Tract: No adrenal abnormality. No focal renal
abnormality. No stones or hydronephrosis. Urinary bladder is
unremarkable.

Stomach/Bowel: Stomach, large and small bowel grossly unremarkable.

Vascular/Lymphatic: No evidence of aneurysm or adenopathy.

Reproductive: Prior hysterectomy.  No adnexal masses.

Other: No free fluid or free air.

Musculoskeletal: No acute bony abnormality.
IMPRESSION: Prior cholecystectomy, appendectomy and hysterectomy.

No acute findings in the abdomen or pelvis.

## 2019-10-26 DIAGNOSIS — Z7189 Other specified counseling: Secondary | ICD-10-CM | POA: Diagnosis not present

## 2019-11-01 DIAGNOSIS — Z9049 Acquired absence of other specified parts of digestive tract: Secondary | ICD-10-CM | POA: Diagnosis not present

## 2019-11-01 DIAGNOSIS — C030 Malignant neoplasm of upper gum: Secondary | ICD-10-CM | POA: Diagnosis not present

## 2019-11-01 DIAGNOSIS — Z882 Allergy status to sulfonamides status: Secondary | ICD-10-CM | POA: Diagnosis not present

## 2019-11-01 DIAGNOSIS — D485 Neoplasm of uncertain behavior of skin: Secondary | ICD-10-CM | POA: Diagnosis not present

## 2019-11-01 DIAGNOSIS — S83272A Complex tear of lateral meniscus, current injury, left knee, initial encounter: Secondary | ICD-10-CM | POA: Diagnosis not present

## 2019-11-01 DIAGNOSIS — Z4682 Encounter for fitting and adjustment of non-vascular catheter: Secondary | ICD-10-CM | POA: Diagnosis not present

## 2019-11-01 DIAGNOSIS — C410 Malignant neoplasm of bones of skull and face: Secondary | ICD-10-CM | POA: Diagnosis not present

## 2019-11-01 DIAGNOSIS — Z87891 Personal history of nicotine dependence: Secondary | ICD-10-CM | POA: Diagnosis not present

## 2019-11-01 DIAGNOSIS — Z452 Encounter for adjustment and management of vascular access device: Secondary | ICD-10-CM | POA: Diagnosis not present

## 2019-11-01 DIAGNOSIS — G4730 Sleep apnea, unspecified: Secondary | ICD-10-CM | POA: Diagnosis not present

## 2019-11-16 DIAGNOSIS — C030 Malignant neoplasm of upper gum: Secondary | ICD-10-CM | POA: Diagnosis not present

## 2019-11-19 DIAGNOSIS — T8149XA Infection following a procedure, other surgical site, initial encounter: Secondary | ICD-10-CM | POA: Diagnosis not present

## 2019-11-19 DIAGNOSIS — Z885 Allergy status to narcotic agent status: Secondary | ICD-10-CM | POA: Diagnosis not present

## 2019-11-19 DIAGNOSIS — Z859 Personal history of malignant neoplasm, unspecified: Secondary | ICD-10-CM | POA: Diagnosis not present

## 2019-11-19 DIAGNOSIS — M25532 Pain in left wrist: Secondary | ICD-10-CM | POA: Diagnosis not present

## 2019-11-19 DIAGNOSIS — Z87891 Personal history of nicotine dependence: Secondary | ICD-10-CM | POA: Diagnosis not present

## 2019-11-19 DIAGNOSIS — Z882 Allergy status to sulfonamides status: Secondary | ICD-10-CM | POA: Diagnosis not present

## 2019-11-19 DIAGNOSIS — L089 Local infection of the skin and subcutaneous tissue, unspecified: Secondary | ICD-10-CM | POA: Diagnosis not present

## 2019-11-19 DIAGNOSIS — Z79899 Other long term (current) drug therapy: Secondary | ICD-10-CM | POA: Diagnosis not present

## 2020-03-01 DIAGNOSIS — C030 Malignant neoplasm of upper gum: Secondary | ICD-10-CM | POA: Diagnosis not present

## 2020-03-01 DIAGNOSIS — Z510 Encounter for antineoplastic radiation therapy: Secondary | ICD-10-CM | POA: Diagnosis not present

## 2020-03-02 DIAGNOSIS — Z510 Encounter for antineoplastic radiation therapy: Secondary | ICD-10-CM | POA: Diagnosis not present

## 2020-03-02 DIAGNOSIS — C030 Malignant neoplasm of upper gum: Secondary | ICD-10-CM | POA: Diagnosis not present

## 2020-03-06 DIAGNOSIS — Z79899 Other long term (current) drug therapy: Secondary | ICD-10-CM | POA: Diagnosis not present

## 2020-03-06 DIAGNOSIS — R112 Nausea with vomiting, unspecified: Secondary | ICD-10-CM | POA: Diagnosis not present

## 2020-03-06 DIAGNOSIS — Z87891 Personal history of nicotine dependence: Secondary | ICD-10-CM | POA: Diagnosis not present

## 2020-03-06 DIAGNOSIS — E860 Dehydration: Secondary | ICD-10-CM | POA: Diagnosis not present

## 2020-03-06 DIAGNOSIS — Z885 Allergy status to narcotic agent status: Secondary | ICD-10-CM | POA: Diagnosis not present

## 2020-03-06 DIAGNOSIS — Z882 Allergy status to sulfonamides status: Secondary | ICD-10-CM | POA: Diagnosis not present

## 2020-03-06 DIAGNOSIS — C069 Malignant neoplasm of mouth, unspecified: Secondary | ICD-10-CM | POA: Diagnosis not present

## 2020-03-06 DIAGNOSIS — D0000 Carcinoma in situ of oral cavity, unspecified site: Secondary | ICD-10-CM | POA: Diagnosis not present

## 2020-03-07 DIAGNOSIS — C049 Malignant neoplasm of floor of mouth, unspecified: Secondary | ICD-10-CM | POA: Diagnosis not present

## 2020-03-07 DIAGNOSIS — E43 Unspecified severe protein-calorie malnutrition: Secondary | ICD-10-CM | POA: Diagnosis not present

## 2020-03-07 DIAGNOSIS — D509 Iron deficiency anemia, unspecified: Secondary | ICD-10-CM | POA: Diagnosis not present

## 2020-03-07 DIAGNOSIS — E860 Dehydration: Secondary | ICD-10-CM | POA: Diagnosis not present

## 2020-03-07 DIAGNOSIS — E876 Hypokalemia: Secondary | ICD-10-CM | POA: Diagnosis not present

## 2020-03-07 DIAGNOSIS — K208 Other esophagitis without bleeding: Secondary | ICD-10-CM | POA: Diagnosis not present

## 2020-03-07 DIAGNOSIS — R110 Nausea: Secondary | ICD-10-CM | POA: Diagnosis not present

## 2020-03-07 DIAGNOSIS — Z681 Body mass index (BMI) 19 or less, adult: Secondary | ICD-10-CM | POA: Diagnosis not present

## 2020-03-10 DIAGNOSIS — D509 Iron deficiency anemia, unspecified: Secondary | ICD-10-CM | POA: Diagnosis not present

## 2020-03-10 DIAGNOSIS — C049 Malignant neoplasm of floor of mouth, unspecified: Secondary | ICD-10-CM | POA: Diagnosis not present

## 2020-03-13 DIAGNOSIS — E860 Dehydration: Secondary | ICD-10-CM | POA: Diagnosis not present

## 2020-03-13 DIAGNOSIS — E876 Hypokalemia: Secondary | ICD-10-CM | POA: Diagnosis not present

## 2020-03-13 DIAGNOSIS — K208 Other esophagitis without bleeding: Secondary | ICD-10-CM | POA: Diagnosis not present

## 2020-03-13 DIAGNOSIS — E43 Unspecified severe protein-calorie malnutrition: Secondary | ICD-10-CM | POA: Diagnosis not present

## 2020-03-13 DIAGNOSIS — C049 Malignant neoplasm of floor of mouth, unspecified: Secondary | ICD-10-CM | POA: Diagnosis not present

## 2020-03-13 DIAGNOSIS — D509 Iron deficiency anemia, unspecified: Secondary | ICD-10-CM | POA: Diagnosis not present

## 2020-03-13 DIAGNOSIS — R110 Nausea: Secondary | ICD-10-CM | POA: Diagnosis not present

## 2020-03-13 DIAGNOSIS — Z681 Body mass index (BMI) 19 or less, adult: Secondary | ICD-10-CM | POA: Diagnosis not present

## 2020-03-15 DIAGNOSIS — E860 Dehydration: Secondary | ICD-10-CM | POA: Diagnosis not present

## 2020-03-15 DIAGNOSIS — D509 Iron deficiency anemia, unspecified: Secondary | ICD-10-CM | POA: Diagnosis not present

## 2020-03-15 DIAGNOSIS — R110 Nausea: Secondary | ICD-10-CM | POA: Diagnosis not present

## 2020-03-15 DIAGNOSIS — Z681 Body mass index (BMI) 19 or less, adult: Secondary | ICD-10-CM | POA: Diagnosis not present

## 2020-03-15 DIAGNOSIS — C049 Malignant neoplasm of floor of mouth, unspecified: Secondary | ICD-10-CM | POA: Diagnosis not present

## 2020-03-15 DIAGNOSIS — E876 Hypokalemia: Secondary | ICD-10-CM | POA: Diagnosis not present

## 2020-03-15 DIAGNOSIS — K208 Other esophagitis without bleeding: Secondary | ICD-10-CM | POA: Diagnosis not present

## 2020-03-15 DIAGNOSIS — E43 Unspecified severe protein-calorie malnutrition: Secondary | ICD-10-CM | POA: Diagnosis not present

## 2020-03-17 DIAGNOSIS — C039 Malignant neoplasm of gum, unspecified: Secondary | ICD-10-CM | POA: Diagnosis not present

## 2020-03-17 DIAGNOSIS — K209 Esophagitis, unspecified without bleeding: Secondary | ICD-10-CM | POA: Diagnosis not present

## 2020-03-17 DIAGNOSIS — K219 Gastro-esophageal reflux disease without esophagitis: Secondary | ICD-10-CM | POA: Diagnosis not present

## 2020-03-17 DIAGNOSIS — C069 Malignant neoplasm of mouth, unspecified: Secondary | ICD-10-CM | POA: Diagnosis not present

## 2020-03-17 DIAGNOSIS — G893 Neoplasm related pain (acute) (chronic): Secondary | ICD-10-CM | POA: Diagnosis not present

## 2020-03-17 DIAGNOSIS — E43 Unspecified severe protein-calorie malnutrition: Secondary | ICD-10-CM | POA: Diagnosis not present

## 2020-03-17 DIAGNOSIS — R634 Abnormal weight loss: Secondary | ICD-10-CM | POA: Diagnosis not present

## 2020-03-17 DIAGNOSIS — N179 Acute kidney failure, unspecified: Secondary | ICD-10-CM | POA: Diagnosis not present

## 2020-03-17 DIAGNOSIS — E876 Hypokalemia: Secondary | ICD-10-CM | POA: Diagnosis not present

## 2020-03-17 DIAGNOSIS — R112 Nausea with vomiting, unspecified: Secondary | ICD-10-CM | POA: Diagnosis not present

## 2020-03-17 DIAGNOSIS — K1230 Oral mucositis (ulcerative), unspecified: Secondary | ICD-10-CM | POA: Diagnosis not present

## 2020-03-17 DIAGNOSIS — E860 Dehydration: Secondary | ICD-10-CM | POA: Diagnosis not present

## 2020-03-17 DIAGNOSIS — M952 Other acquired deformity of head: Secondary | ICD-10-CM | POA: Diagnosis not present

## 2020-03-17 DIAGNOSIS — R1311 Dysphagia, oral phase: Secondary | ICD-10-CM | POA: Diagnosis not present

## 2020-03-17 DIAGNOSIS — E46 Unspecified protein-calorie malnutrition: Secondary | ICD-10-CM | POA: Diagnosis not present

## 2020-03-17 DIAGNOSIS — I959 Hypotension, unspecified: Secondary | ICD-10-CM | POA: Diagnosis not present

## 2020-03-17 DIAGNOSIS — B379 Candidiasis, unspecified: Secondary | ICD-10-CM | POA: Diagnosis not present

## 2020-03-17 DIAGNOSIS — Z9049 Acquired absence of other specified parts of digestive tract: Secondary | ICD-10-CM | POA: Diagnosis not present

## 2020-03-28 DIAGNOSIS — E43 Unspecified severe protein-calorie malnutrition: Secondary | ICD-10-CM | POA: Diagnosis not present

## 2020-03-28 DIAGNOSIS — C049 Malignant neoplasm of floor of mouth, unspecified: Secondary | ICD-10-CM | POA: Diagnosis not present

## 2020-03-28 DIAGNOSIS — R110 Nausea: Secondary | ICD-10-CM | POA: Diagnosis not present

## 2020-03-28 DIAGNOSIS — Z681 Body mass index (BMI) 19 or less, adult: Secondary | ICD-10-CM | POA: Diagnosis not present

## 2020-03-28 DIAGNOSIS — E876 Hypokalemia: Secondary | ICD-10-CM | POA: Diagnosis not present

## 2020-03-28 DIAGNOSIS — D509 Iron deficiency anemia, unspecified: Secondary | ICD-10-CM | POA: Diagnosis not present

## 2020-03-28 DIAGNOSIS — E860 Dehydration: Secondary | ICD-10-CM | POA: Diagnosis not present

## 2020-03-28 DIAGNOSIS — K208 Other esophagitis without bleeding: Secondary | ICD-10-CM | POA: Diagnosis not present

## 2020-03-30 DIAGNOSIS — Z03818 Encounter for observation for suspected exposure to other biological agents ruled out: Secondary | ICD-10-CM | POA: Diagnosis not present

## 2020-04-18 DIAGNOSIS — R1310 Dysphagia, unspecified: Secondary | ICD-10-CM | POA: Diagnosis not present

## 2020-04-18 DIAGNOSIS — R059 Cough, unspecified: Secondary | ICD-10-CM | POA: Diagnosis not present

## 2020-04-18 DIAGNOSIS — C030 Malignant neoplasm of upper gum: Secondary | ICD-10-CM | POA: Diagnosis not present

## 2020-04-24 DIAGNOSIS — G4730 Sleep apnea, unspecified: Secondary | ICD-10-CM | POA: Diagnosis not present

## 2020-04-24 DIAGNOSIS — C039 Malignant neoplasm of gum, unspecified: Secondary | ICD-10-CM | POA: Diagnosis not present

## 2020-04-24 DIAGNOSIS — K219 Gastro-esophageal reflux disease without esophagitis: Secondary | ICD-10-CM | POA: Diagnosis not present

## 2020-04-24 DIAGNOSIS — Z885 Allergy status to narcotic agent status: Secondary | ICD-10-CM | POA: Diagnosis not present

## 2020-04-24 DIAGNOSIS — C069 Malignant neoplasm of mouth, unspecified: Secondary | ICD-10-CM | POA: Diagnosis not present

## 2020-04-24 DIAGNOSIS — Z431 Encounter for attention to gastrostomy: Secondary | ICD-10-CM | POA: Diagnosis not present

## 2020-04-24 DIAGNOSIS — Z79899 Other long term (current) drug therapy: Secondary | ICD-10-CM | POA: Diagnosis not present

## 2020-04-24 DIAGNOSIS — Z882 Allergy status to sulfonamides status: Secondary | ICD-10-CM | POA: Diagnosis not present

## 2020-04-27 DIAGNOSIS — K1231 Oral mucositis (ulcerative) due to antineoplastic therapy: Secondary | ICD-10-CM | POA: Diagnosis not present

## 2020-04-27 DIAGNOSIS — R110 Nausea: Secondary | ICD-10-CM | POA: Diagnosis not present

## 2020-04-27 DIAGNOSIS — T451X5A Adverse effect of antineoplastic and immunosuppressive drugs, initial encounter: Secondary | ICD-10-CM | POA: Diagnosis not present

## 2020-04-27 DIAGNOSIS — C039 Malignant neoplasm of gum, unspecified: Secondary | ICD-10-CM | POA: Diagnosis not present

## 2020-04-27 DIAGNOSIS — E43 Unspecified severe protein-calorie malnutrition: Secondary | ICD-10-CM | POA: Diagnosis not present

## 2020-04-27 DIAGNOSIS — N179 Acute kidney failure, unspecified: Secondary | ICD-10-CM | POA: Diagnosis not present

## 2020-04-27 DIAGNOSIS — C049 Malignant neoplasm of floor of mouth, unspecified: Secondary | ICD-10-CM | POA: Diagnosis not present

## 2020-04-27 DIAGNOSIS — N178 Other acute kidney failure: Secondary | ICD-10-CM | POA: Diagnosis not present

## 2020-04-27 DIAGNOSIS — D509 Iron deficiency anemia, unspecified: Secondary | ICD-10-CM | POA: Diagnosis not present

## 2020-05-09 DIAGNOSIS — J0100 Acute maxillary sinusitis, unspecified: Secondary | ICD-10-CM | POA: Diagnosis not present

## 2020-05-16 DIAGNOSIS — C030 Malignant neoplasm of upper gum: Secondary | ICD-10-CM | POA: Diagnosis not present

## 2020-06-05 DIAGNOSIS — T451X5A Adverse effect of antineoplastic and immunosuppressive drugs, initial encounter: Secondary | ICD-10-CM | POA: Diagnosis not present

## 2020-06-05 DIAGNOSIS — Z681 Body mass index (BMI) 19 or less, adult: Secondary | ICD-10-CM | POA: Diagnosis not present

## 2020-06-05 DIAGNOSIS — Z79899 Other long term (current) drug therapy: Secondary | ICD-10-CM | POA: Diagnosis not present

## 2020-06-05 DIAGNOSIS — C049 Malignant neoplasm of floor of mouth, unspecified: Secondary | ICD-10-CM | POA: Diagnosis not present

## 2020-06-05 DIAGNOSIS — E876 Hypokalemia: Secondary | ICD-10-CM | POA: Diagnosis not present

## 2020-06-05 DIAGNOSIS — D509 Iron deficiency anemia, unspecified: Secondary | ICD-10-CM | POA: Diagnosis not present

## 2020-06-05 DIAGNOSIS — R5383 Other fatigue: Secondary | ICD-10-CM | POA: Diagnosis not present

## 2020-06-05 DIAGNOSIS — R110 Nausea: Secondary | ICD-10-CM | POA: Diagnosis not present

## 2020-06-05 DIAGNOSIS — Z452 Encounter for adjustment and management of vascular access device: Secondary | ICD-10-CM | POA: Diagnosis not present

## 2020-06-05 DIAGNOSIS — K1230 Oral mucositis (ulcerative), unspecified: Secondary | ICD-10-CM | POA: Diagnosis not present

## 2020-06-05 DIAGNOSIS — E43 Unspecified severe protein-calorie malnutrition: Secondary | ICD-10-CM | POA: Diagnosis not present

## 2020-06-05 DIAGNOSIS — L659 Nonscarring hair loss, unspecified: Secondary | ICD-10-CM | POA: Diagnosis not present

## 2020-06-05 DIAGNOSIS — C039 Malignant neoplasm of gum, unspecified: Secondary | ICD-10-CM | POA: Diagnosis not present

## 2020-06-05 DIAGNOSIS — N179 Acute kidney failure, unspecified: Secondary | ICD-10-CM | POA: Diagnosis not present

## 2020-06-06 DIAGNOSIS — C069 Malignant neoplasm of mouth, unspecified: Secondary | ICD-10-CM | POA: Diagnosis not present

## 2020-06-06 DIAGNOSIS — C030 Malignant neoplasm of upper gum: Secondary | ICD-10-CM | POA: Diagnosis not present

## 2020-06-08 DIAGNOSIS — Z885 Allergy status to narcotic agent status: Secondary | ICD-10-CM | POA: Diagnosis not present

## 2020-06-08 DIAGNOSIS — Z923 Personal history of irradiation: Secondary | ICD-10-CM | POA: Diagnosis not present

## 2020-06-08 DIAGNOSIS — F32A Depression, unspecified: Secondary | ICD-10-CM | POA: Diagnosis not present

## 2020-06-08 DIAGNOSIS — Z882 Allergy status to sulfonamides status: Secondary | ICD-10-CM | POA: Diagnosis not present

## 2020-06-08 DIAGNOSIS — C030 Malignant neoplasm of upper gum: Secondary | ICD-10-CM | POA: Diagnosis not present

## 2020-06-08 DIAGNOSIS — Z79899 Other long term (current) drug therapy: Secondary | ICD-10-CM | POA: Diagnosis not present

## 2020-06-08 DIAGNOSIS — F17210 Nicotine dependence, cigarettes, uncomplicated: Secondary | ICD-10-CM | POA: Diagnosis not present

## 2020-06-19 DIAGNOSIS — E538 Deficiency of other specified B group vitamins: Secondary | ICD-10-CM | POA: Diagnosis not present

## 2020-06-22 DIAGNOSIS — C760 Malignant neoplasm of head, face and neck: Secondary | ICD-10-CM | POA: Diagnosis not present

## 2020-06-22 DIAGNOSIS — R059 Cough, unspecified: Secondary | ICD-10-CM | POA: Diagnosis not present

## 2020-06-22 DIAGNOSIS — R1310 Dysphagia, unspecified: Secondary | ICD-10-CM | POA: Diagnosis not present

## 2020-06-22 DIAGNOSIS — Z923 Personal history of irradiation: Secondary | ICD-10-CM | POA: Diagnosis not present

## 2020-06-22 DIAGNOSIS — C039 Malignant neoplasm of gum, unspecified: Secondary | ICD-10-CM | POA: Diagnosis not present

## 2020-06-27 DIAGNOSIS — E538 Deficiency of other specified B group vitamins: Secondary | ICD-10-CM | POA: Diagnosis not present

## 2020-07-04 DIAGNOSIS — E538 Deficiency of other specified B group vitamins: Secondary | ICD-10-CM | POA: Diagnosis not present

## 2020-07-06 DIAGNOSIS — R634 Abnormal weight loss: Secondary | ICD-10-CM | POA: Diagnosis not present

## 2020-07-06 DIAGNOSIS — T451X5A Adverse effect of antineoplastic and immunosuppressive drugs, initial encounter: Secondary | ICD-10-CM | POA: Diagnosis not present

## 2020-07-06 DIAGNOSIS — C049 Malignant neoplasm of floor of mouth, unspecified: Secondary | ICD-10-CM | POA: Diagnosis not present

## 2020-07-06 DIAGNOSIS — N179 Acute kidney failure, unspecified: Secondary | ICD-10-CM | POA: Diagnosis not present

## 2020-07-06 DIAGNOSIS — Z681 Body mass index (BMI) 19 or less, adult: Secondary | ICD-10-CM | POA: Diagnosis not present

## 2020-07-06 DIAGNOSIS — E890 Postprocedural hypothyroidism: Secondary | ICD-10-CM | POA: Diagnosis not present

## 2020-07-06 DIAGNOSIS — Z08 Encounter for follow-up examination after completed treatment for malignant neoplasm: Secondary | ICD-10-CM | POA: Diagnosis not present

## 2020-07-06 DIAGNOSIS — Z85818 Personal history of malignant neoplasm of other sites of lip, oral cavity, and pharynx: Secondary | ICD-10-CM | POA: Diagnosis not present

## 2020-07-06 DIAGNOSIS — E876 Hypokalemia: Secondary | ICD-10-CM | POA: Diagnosis not present

## 2020-07-06 DIAGNOSIS — E43 Unspecified severe protein-calorie malnutrition: Secondary | ICD-10-CM | POA: Diagnosis not present

## 2020-07-06 DIAGNOSIS — Z79899 Other long term (current) drug therapy: Secondary | ICD-10-CM | POA: Diagnosis not present

## 2020-07-06 DIAGNOSIS — E538 Deficiency of other specified B group vitamins: Secondary | ICD-10-CM | POA: Diagnosis not present

## 2020-07-12 DIAGNOSIS — R110 Nausea: Secondary | ICD-10-CM | POA: Diagnosis not present

## 2020-07-12 DIAGNOSIS — R531 Weakness: Secondary | ICD-10-CM | POA: Diagnosis not present

## 2020-07-12 DIAGNOSIS — Z20822 Contact with and (suspected) exposure to covid-19: Secondary | ICD-10-CM | POA: Diagnosis not present

## 2020-07-12 DIAGNOSIS — R0981 Nasal congestion: Secondary | ICD-10-CM | POA: Diagnosis not present

## 2020-07-13 DIAGNOSIS — E538 Deficiency of other specified B group vitamins: Secondary | ICD-10-CM | POA: Diagnosis not present

## 2020-07-25 DIAGNOSIS — C049 Malignant neoplasm of floor of mouth, unspecified: Secondary | ICD-10-CM | POA: Diagnosis not present

## 2020-07-25 DIAGNOSIS — C030 Malignant neoplasm of upper gum: Secondary | ICD-10-CM | POA: Diagnosis not present

## 2020-08-15 DIAGNOSIS — R1110 Vomiting, unspecified: Secondary | ICD-10-CM | POA: Diagnosis not present

## 2020-08-15 DIAGNOSIS — J069 Acute upper respiratory infection, unspecified: Secondary | ICD-10-CM | POA: Diagnosis not present

## 2020-08-15 DIAGNOSIS — Z20822 Contact with and (suspected) exposure to covid-19: Secondary | ICD-10-CM | POA: Diagnosis not present

## 2020-08-15 DIAGNOSIS — J029 Acute pharyngitis, unspecified: Secondary | ICD-10-CM | POA: Diagnosis not present

## 2020-08-18 DIAGNOSIS — E538 Deficiency of other specified B group vitamins: Secondary | ICD-10-CM | POA: Diagnosis not present

## 2020-08-24 DIAGNOSIS — C411 Malignant neoplasm of mandible: Secondary | ICD-10-CM | POA: Diagnosis not present

## 2020-08-24 DIAGNOSIS — C049 Malignant neoplasm of floor of mouth, unspecified: Secondary | ICD-10-CM | POA: Diagnosis not present

## 2020-08-24 DIAGNOSIS — M952 Other acquired deformity of head: Secondary | ICD-10-CM | POA: Diagnosis not present

## 2020-08-24 DIAGNOSIS — L439 Lichen planus, unspecified: Secondary | ICD-10-CM | POA: Diagnosis not present

## 2020-08-24 DIAGNOSIS — L905 Scar conditions and fibrosis of skin: Secondary | ICD-10-CM | POA: Diagnosis not present

## 2020-08-24 DIAGNOSIS — C030 Malignant neoplasm of upper gum: Secondary | ICD-10-CM | POA: Diagnosis not present

## 2020-08-25 DIAGNOSIS — R6889 Other general symptoms and signs: Secondary | ICD-10-CM | POA: Diagnosis not present

## 2020-09-01 DIAGNOSIS — Z85818 Personal history of malignant neoplasm of other sites of lip, oral cavity, and pharynx: Secondary | ICD-10-CM | POA: Diagnosis not present

## 2020-09-01 DIAGNOSIS — E538 Deficiency of other specified B group vitamins: Secondary | ICD-10-CM | POA: Diagnosis not present

## 2020-09-01 DIAGNOSIS — D509 Iron deficiency anemia, unspecified: Secondary | ICD-10-CM | POA: Diagnosis not present

## 2020-09-01 DIAGNOSIS — Z452 Encounter for adjustment and management of vascular access device: Secondary | ICD-10-CM | POA: Diagnosis not present

## 2020-09-01 DIAGNOSIS — E43 Unspecified severe protein-calorie malnutrition: Secondary | ICD-10-CM | POA: Diagnosis not present

## 2020-09-01 DIAGNOSIS — C049 Malignant neoplasm of floor of mouth, unspecified: Secondary | ICD-10-CM | POA: Diagnosis not present

## 2020-09-01 DIAGNOSIS — C039 Malignant neoplasm of gum, unspecified: Secondary | ICD-10-CM | POA: Diagnosis not present

## 2020-09-01 DIAGNOSIS — E890 Postprocedural hypothyroidism: Secondary | ICD-10-CM | POA: Diagnosis not present

## 2020-09-01 DIAGNOSIS — N179 Acute kidney failure, unspecified: Secondary | ICD-10-CM | POA: Diagnosis not present

## 2020-09-01 DIAGNOSIS — K117 Disturbances of salivary secretion: Secondary | ICD-10-CM | POA: Diagnosis not present

## 2020-09-01 DIAGNOSIS — Z08 Encounter for follow-up examination after completed treatment for malignant neoplasm: Secondary | ICD-10-CM | POA: Diagnosis not present

## 2020-09-05 DIAGNOSIS — Z08 Encounter for follow-up examination after completed treatment for malignant neoplasm: Secondary | ICD-10-CM | POA: Diagnosis not present

## 2020-09-05 DIAGNOSIS — C760 Malignant neoplasm of head, face and neck: Secondary | ICD-10-CM | POA: Diagnosis not present

## 2020-09-05 DIAGNOSIS — Z85819 Personal history of malignant neoplasm of unspecified site of lip, oral cavity, and pharynx: Secondary | ICD-10-CM | POA: Diagnosis not present

## 2020-09-05 DIAGNOSIS — C030 Malignant neoplasm of upper gum: Secondary | ICD-10-CM | POA: Diagnosis not present

## 2020-09-05 DIAGNOSIS — R221 Localized swelling, mass and lump, neck: Secondary | ICD-10-CM | POA: Diagnosis not present

## 2020-09-14 DIAGNOSIS — E538 Deficiency of other specified B group vitamins: Secondary | ICD-10-CM | POA: Diagnosis not present

## 2020-09-15 DIAGNOSIS — Z1152 Encounter for screening for COVID-19: Secondary | ICD-10-CM | POA: Diagnosis not present

## 2020-09-28 DIAGNOSIS — R1312 Dysphagia, oropharyngeal phase: Secondary | ICD-10-CM | POA: Diagnosis not present

## 2020-09-28 DIAGNOSIS — C049 Malignant neoplasm of floor of mouth, unspecified: Secondary | ICD-10-CM | POA: Diagnosis not present

## 2020-09-28 DIAGNOSIS — T17908D Unspecified foreign body in respiratory tract, part unspecified causing other injury, subsequent encounter: Secondary | ICD-10-CM | POA: Diagnosis not present

## 2020-09-28 DIAGNOSIS — M952 Other acquired deformity of head: Secondary | ICD-10-CM | POA: Diagnosis not present

## 2020-09-29 DIAGNOSIS — E538 Deficiency of other specified B group vitamins: Secondary | ICD-10-CM | POA: Diagnosis not present

## 2020-10-04 DIAGNOSIS — C039 Malignant neoplasm of gum, unspecified: Secondary | ICD-10-CM | POA: Diagnosis not present

## 2020-10-04 DIAGNOSIS — C069 Malignant neoplasm of mouth, unspecified: Secondary | ICD-10-CM | POA: Diagnosis not present

## 2020-10-04 DIAGNOSIS — R918 Other nonspecific abnormal finding of lung field: Secondary | ICD-10-CM | POA: Diagnosis not present

## 2020-10-05 DIAGNOSIS — C030 Malignant neoplasm of upper gum: Secondary | ICD-10-CM | POA: Diagnosis not present

## 2020-10-06 DIAGNOSIS — E538 Deficiency of other specified B group vitamins: Secondary | ICD-10-CM | POA: Diagnosis not present

## 2020-10-12 DIAGNOSIS — E538 Deficiency of other specified B group vitamins: Secondary | ICD-10-CM | POA: Diagnosis not present

## 2020-10-23 DIAGNOSIS — Z85818 Personal history of malignant neoplasm of other sites of lip, oral cavity, and pharynx: Secondary | ICD-10-CM | POA: Diagnosis not present

## 2020-10-23 DIAGNOSIS — K08109 Complete loss of teeth, unspecified cause, unspecified class: Secondary | ICD-10-CM | POA: Diagnosis not present

## 2020-10-23 DIAGNOSIS — Z01818 Encounter for other preprocedural examination: Secondary | ICD-10-CM | POA: Diagnosis not present

## 2020-10-31 DIAGNOSIS — J0100 Acute maxillary sinusitis, unspecified: Secondary | ICD-10-CM | POA: Diagnosis not present

## 2020-10-31 DIAGNOSIS — Z1231 Encounter for screening mammogram for malignant neoplasm of breast: Secondary | ICD-10-CM | POA: Diagnosis not present

## 2020-10-31 DIAGNOSIS — Z1211 Encounter for screening for malignant neoplasm of colon: Secondary | ICD-10-CM | POA: Diagnosis not present

## 2020-11-09 DIAGNOSIS — Z131 Encounter for screening for diabetes mellitus: Secondary | ICD-10-CM | POA: Diagnosis not present

## 2020-11-09 DIAGNOSIS — Z0000 Encounter for general adult medical examination without abnormal findings: Secondary | ICD-10-CM | POA: Diagnosis not present

## 2020-11-09 DIAGNOSIS — Z13220 Encounter for screening for lipoid disorders: Secondary | ICD-10-CM | POA: Diagnosis not present

## 2020-11-09 DIAGNOSIS — Z85819 Personal history of malignant neoplasm of unspecified site of lip, oral cavity, and pharynx: Secondary | ICD-10-CM | POA: Diagnosis not present

## 2020-11-09 DIAGNOSIS — E782 Mixed hyperlipidemia: Secondary | ICD-10-CM | POA: Diagnosis not present

## 2020-12-07 DIAGNOSIS — C049 Malignant neoplasm of floor of mouth, unspecified: Secondary | ICD-10-CM | POA: Diagnosis not present

## 2020-12-07 DIAGNOSIS — C039 Malignant neoplasm of gum, unspecified: Secondary | ICD-10-CM | POA: Diagnosis not present

## 2020-12-07 DIAGNOSIS — Z95828 Presence of other vascular implants and grafts: Secondary | ICD-10-CM | POA: Diagnosis not present

## 2020-12-07 DIAGNOSIS — K117 Disturbances of salivary secretion: Secondary | ICD-10-CM | POA: Diagnosis not present

## 2020-12-07 DIAGNOSIS — T451X5A Adverse effect of antineoplastic and immunosuppressive drugs, initial encounter: Secondary | ICD-10-CM | POA: Diagnosis not present

## 2020-12-07 DIAGNOSIS — E890 Postprocedural hypothyroidism: Secondary | ICD-10-CM | POA: Diagnosis not present

## 2020-12-07 DIAGNOSIS — D509 Iron deficiency anemia, unspecified: Secondary | ICD-10-CM | POA: Diagnosis not present

## 2020-12-07 DIAGNOSIS — E538 Deficiency of other specified B group vitamins: Secondary | ICD-10-CM | POA: Diagnosis not present

## 2020-12-07 DIAGNOSIS — N179 Acute kidney failure, unspecified: Secondary | ICD-10-CM | POA: Diagnosis not present

## 2020-12-07 DIAGNOSIS — E860 Dehydration: Secondary | ICD-10-CM | POA: Diagnosis not present

## 2020-12-14 DIAGNOSIS — Z1231 Encounter for screening mammogram for malignant neoplasm of breast: Secondary | ICD-10-CM | POA: Diagnosis not present

## 2020-12-14 DIAGNOSIS — E538 Deficiency of other specified B group vitamins: Secondary | ICD-10-CM | POA: Diagnosis not present

## 2020-12-27 DIAGNOSIS — J0100 Acute maxillary sinusitis, unspecified: Secondary | ICD-10-CM | POA: Diagnosis not present

## 2020-12-27 DIAGNOSIS — R509 Fever, unspecified: Secondary | ICD-10-CM | POA: Diagnosis not present

## 2021-01-19 DIAGNOSIS — E538 Deficiency of other specified B group vitamins: Secondary | ICD-10-CM | POA: Diagnosis not present

## 2021-01-23 DIAGNOSIS — J069 Acute upper respiratory infection, unspecified: Secondary | ICD-10-CM | POA: Diagnosis not present

## 2021-01-23 DIAGNOSIS — H6501 Acute serous otitis media, right ear: Secondary | ICD-10-CM | POA: Diagnosis not present

## 2021-01-23 DIAGNOSIS — Z20822 Contact with and (suspected) exposure to covid-19: Secondary | ICD-10-CM | POA: Diagnosis not present

## 2021-01-24 DIAGNOSIS — R059 Cough, unspecified: Secondary | ICD-10-CM | POA: Diagnosis not present

## 2021-01-24 DIAGNOSIS — B370 Candidal stomatitis: Secondary | ICD-10-CM | POA: Diagnosis not present

## 2021-01-24 DIAGNOSIS — Z20822 Contact with and (suspected) exposure to covid-19: Secondary | ICD-10-CM | POA: Diagnosis not present

## 2021-01-24 DIAGNOSIS — J40 Bronchitis, not specified as acute or chronic: Secondary | ICD-10-CM | POA: Diagnosis not present

## 2021-01-24 DIAGNOSIS — R079 Chest pain, unspecified: Secondary | ICD-10-CM | POA: Diagnosis not present

## 2021-01-24 DIAGNOSIS — J209 Acute bronchitis, unspecified: Secondary | ICD-10-CM | POA: Diagnosis not present

## 2021-11-05 ENCOUNTER — Encounter (HOSPITAL_BASED_OUTPATIENT_CLINIC_OR_DEPARTMENT_OTHER): Payer: Medicare Other | Attending: General Surgery | Admitting: General Surgery

## 2021-11-05 DIAGNOSIS — Z923 Personal history of irradiation: Secondary | ICD-10-CM | POA: Insufficient documentation

## 2021-11-05 DIAGNOSIS — M8788 Other osteonecrosis, other site: Secondary | ICD-10-CM | POA: Diagnosis not present

## 2021-11-05 DIAGNOSIS — C411 Malignant neoplasm of mandible: Secondary | ICD-10-CM | POA: Insufficient documentation

## 2021-11-05 DIAGNOSIS — M272 Inflammatory conditions of jaws: Secondary | ICD-10-CM | POA: Diagnosis present

## 2021-11-05 NOTE — Progress Notes (Signed)
[name], [name] ([id number]) ?Visit Report for 11/05/2021 ?Abuse Risk Screen Details ?Patient Name: Date of Service: ?[name] 11/05/2021 10:00 A M ?Medical Record Number: [id number] ?Patient Account Number: [id number] ?Date of Birth/Sex: Treating RN: ?[birth year] (61 y.o. [name], [name] ?Primary Care Rage Beever: [place] [name] Other Clinician: ?Referring Lasheba Stevens: ?Treating Natividad Schlosser/Extender: [name] ?Colin Benton ?Weeks in Treatment: 0 ?Abuse Risk Screen Items ?Answer ?ABUSE RISK SCREEN: ?Has anyone close to you tried to hurt or harm you recentlyo No ?Do you feel uncomfortable with anyone in your familyo No ?Has anyone forced you do things that you didnt want to doo No ?Electronic Signature(s) ?Signed: 11/05/2021 5:39:57 PM By: Levan Hurst RN, BSN ?Entered By: Levan Hurst on 11/05/2021 10:33:32 ?-------------------------------------------------------------------------------- ?Activities of Daily Living Details ?Patient Name: Date of Service: ?[name] 11/05/2021 10:00 A M ?Medical Record Number: [id number] ?Patient Account Number: [id number] ?Date of Birth/Sex: Treating RN: ?[birth year] (61 y.o. [name], [name] ?Primary Care [name] [name]: [place] [name] Other Clinician: ?Referring Eliab Closson: ?Treating Llewelyn Sheaffer/Extender: [name] ?Colin Benton ?Weeks in Treatment: 0 ?Activities of Daily Living Items ?Answer ?Activities of Daily Living (Please select one for each item) ?[place] ?T Medications ?ake Completely Able ?Use T elephone Completely Able ?Care for Appearance Completely Able ?Use T oilet Completely Able ?Bath / Shower Completely Able ?Dress Self Completely Able ?Feed Self Completely Able ?Walk Completely Able ?Get In / Out Bed Completely Able ?Housework Completely Able ?Prepare Meals Completely Able ?Handle Money Completely Able ?Shop for Self Completely Able ?Electronic Signature(s) ?Signed: 11/05/2021 5:39:57 PM By: Levan Hurst RN,  BSN ?Entered By: Levan Hurst on 11/05/2021 10:33:52 ?-------------------------------------------------------------------------------- ?Education Screening Details ?Patient Name: ?Date of Service: ?[name] 11/05/2021 10:00 A M ?Medical Record Number: [id number] ?Patient Account Number: [id number] ?Date of Birth/Sex: ?Treating RN: ?[birth year] (61 y.o. [name], [name] ?Primary Care Carder Yin: [place] [name] ?Other Clinician: ?Referring Emiliana Blaize: ?Treating Tashiana Lamarca/Extender: [name] ?Colin Benton ?Weeks in Treatment: 0 ?Primary Learner Assessed: Patient ?Learning Preferences/Education Level/Primary Language ?Learning Preference: Explanation ?Preferred Language: English ?Cognitive Barrier ?Language Barrier: No ?Translator Needed: No ?Memory Deficit: No ?Emotional Barrier: No ?Cultural/Religious Beliefs Affecting Medical Care: No ?Physical Barrier ?Impaired Vision: No ?Impaired Hearing: No ?Decreased Hand dexterity: No ?Knowledge/Comprehension ?Knowledge Level: High ?Comprehension Level: High ?Ability to understand written instructions: High ?Ability to understand verbal instructions: High ?Motivation ?Anxiety Level: Calm ?Cooperation: Cooperative ?Education Importance: Acknowledges Need ?Interest in Health Problems: Asks Questions ?Perception: Coherent ?Willingness to Engage in Self-Management High ?Activities: ?Readiness to Engage in Self-Management High ?Activities: ?Electronic Signature(s) ?Signed: 11/05/2021 5:39:57 PM By: Levan Hurst RN, BSN ?Entered By: Levan Hurst on 11/05/2021 10:35:05 ?-------------------------------------------------------------------------------- ?Fall Risk Assessment Details ?Patient Name: ?Date of Service: ?[name] 11/05/2021 10:00 A M ?Medical Record Number: [id number] ?Patient Account Number: [id number] ?Date of Birth/Sex: ?Treating RN: ?09-29-1960 (61 y.o. [name], [name] ?Primary Care Majid Mccravy: [place] [name] ?Other  Clinician: ?Referring Laurance Heide: ?Treating Peter Daquila/Extender: [name] ?Colin Benton ?Weeks in Treatment: 0 ?Fall Risk Assessment Items ?Have you had 2 or more falls in the last 12 monthso 0 No ?Have you had any fall that resulted in injury in the last 12 monthso 0 No ?FALLS RISK SCREEN ?History of falling - immediate or within 3 months 0 No ?Secondary diagnosis (Do you have 2 or more medical diagnoseso) 0 No ?Ambulatory aid ?None/bed rest/wheelchair/nurse 0 Yes ?Crutches/cane/walker 0 No ?Furniture 0 No ?Intravenous  therapy Access/Saline/Heparin Lock 0 No ?Gait/Transferring ?Normal/ bed rest/ wheelchair 0 Yes ?Weak (short steps with or without shuffle, stooped but able to lift head while walking, may seek 0 No ?support from furniture) ?Impaired (short steps with shuffle, may have difficulty arising from chair, head down, impaired 0 No ?balance) ?Mental Status ?Oriented to own ability 0 Yes ?Electronic Signature(s) ?Signed: 11/05/2021 5:39:57 PM By: Levan Hurst RN, BSN ?Entered By: Levan Hurst on 11/05/2021 10:35:16 ?-------------------------------------------------------------------------------- ?Nutrition Risk Screening Details ?Patient Name: ?Date of Service: ?[name] 11/05/2021 10:00 A M ?Medical Record Number: [id number] ?Patient Account Number: [id number] ?Date of Birth/Sex: ?Treating RN: ?06-03-1961 (61 y.o. [name], [name] ?Primary Care Carneshia Raker: [place] [name] ?Other Clinician: ?Referring Snow Peoples: ?Treating Carman Auxier/Extender: [name] ?Colin Benton ?Weeks in Treatment: 0 ?Height (in): 60 ?Weight (lbs): 88 ?Body Mass Index (BMI): 17.2 ?Nutrition Risk Screening Items ?Score Screening ?NUTRITION RISK SCREEN: ?I have an illness or condition that made me change the kind and/or amount of food I eat 0 No ?I eat fewer than two meals per day 0 No ?I eat few fruits and vegetables, or milk products 0 No ?I have three or more drinks of beer, liquor or wine almost every day 0  No ?I have tooth or mouth problems that make it hard for me to eat 2 Yes ?I don't always have enough money to buy the food I need 0 No ?I eat alone most of the time 0 No ?I take three or more different prescribed or over-the-counter drugs a day 1 Yes ?Without wanting to, I have lost or gained 10 pounds in the last six months 0 No ?I am not always physically able to shop, cook and/or feed myself 0 No ?Nutrition Protocols ?Good Risk Protocol ?Moderate Risk Protocol 0 Provide education on nutrition ?High Risk Proctocol ?Risk Level: Moderate Risk ?Score: 3 ?Electronic Signature(s) ?Signed: 11/05/2021 5:39:57 PM By: Levan Hurst RN, BSN ?Entered By: Levan Hurst on 11/05/2021 10:35:30 ?

## 2021-11-05 NOTE — Progress Notes (Signed)
DORENA, DORFMAN ([id number]) ?Visit Report for 11/05/2021 ?Chief Complaint Document Details ?Patient Name: Date of Service: ?[name] 11/05/2021 10:00 A M ?Medical Record Number: [id number] ?Patient Account Number: [id number] ?Date of Birth/Sex: Treating RN: ?02-11-1961 (61 y.o. 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 11/05/2021 10:00 A M ?Medical Record Number: [id number] ?Patient Account Number: [id number] ?Date of Birth/Sex: Treating RN: ?1961-05-13 (61 y.o. 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 She underwent resection with a fibular ?free flap in followed by a chin implant and tissue rearrangement at [facility] in June 2021. She underwent chemotherapy with cisplatin, completed in ?September 2021. She received radiation therapy of 66 Gray in 33 fractions which was completed in September 2021. She also previously had resection of ?gingival and tongue cancer with a marginal mandibulectomy. This was back in 2012. ?More recently, an attempt at maxillary dental implants was made.  During the course of this, it was hypothesized that she had some transient bacteremia which ?seeded her titanium implant. This subsequently eroded through her skin and she has a draining sinus tract. She is followed by otolaryngology at [facility]. They ?felt that her best chance of healing the wound and preserving her implant, specifically in the setting of radiation therapy would be a prolonged course of IV ?antibiotics as well as hyperbaric oxygen therapy. She was initially seen by the [facility] wound care clinic but expressed a preference to come here. ?Fortunately, the majority of the work-up was done by Dr. Con Memos. This included an EKG and chest x-ray. She does have a Port-A-Cath and will be initiating IV ?antibiotics today for 6-week course of ertapenem. She has been Referred to undergo hyperbaric oxygen therapy at our center. ?Electronic Signature(s) ?Signed: 11/05/2021 11:42:02 AM By: [name] MD FACS ?Entered By: [name] on 11/05/2021 11:42:02 ?-------------------------------------------------------------------------------- ?Physical Exam Details ?Patient Name: Date of Service: ?[name] 11/05/2021 10:00 A M ?Medical Record Number: [id number] ?Patient Account Number: [id number] ?Date of Birth/Sex: Treating RN: ?1961/03/28 (61 y.o. [name], [name] ?Primary Care Provider: [place], [place] [name] Other Clinician: ?Referring Provider: ?Treating Provider/Extender: [name] ?Colin Benton ?Weeks in Treatment: 0 ?Constitutional ?. . . . She is severely underweight.. No acute distress. ?Ears, Nose, Mouth, and Throat ?There are scars on her right tympanic membrane, but the remainder of the exam is normal bilaterally.Marland Kitchen ?Neck ?She has scarring from prior surgeries, as well as tissue damage consistent with the given history of radiation therapy. There is also an open wound on the ?anterior aspect of her right mandible. Mesh and bone are palpable through this defect. No purulent  drainage.Marland Kitchen ?Respiratory ?Normal work of breathing on room air.. Lungs clear to auscultation bilaterally. ?Electronic Signature(s) ?Signed: 11/05/2021 11:55:56 AM By: [name] MD FACS ?Entered By: Celine Ahr,  Saurav Crumble on 11/05/2021 11:55:56 ?-------------------------------------------------------------------------------- ?Physician Orders Details ?Patient Name: Date of Service: ?[name] 11/05/2021 10:00 A M ?Medical Record Number: [id number] ?Patient Account Number: [id number] ?Date of Birth/Sex: Treating RN: ?1960-09-27 (61 y.o. 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, unspecified ?Follow-up Appointments ?ppointment in: - We will call to schedule date to start Hyperbarics once we get approval from insurance ?Return A ?Hyperbaric Oxygen Therapy ?Evaluate for HBO Therapy ?Indication: - Osteoradionecrosis of jaw ?If appropriate for treatment, begin HBOT per protocol: ?2.5 ATA for 90 Minutes with 2 Five (5) Minute A Breaks ?ir ?Total Number of Treatments: - 40 ?One treatments per day (delivered Monday through Friday unless otherwise specified in Special Instructions below): ?A frin (Oxymetazoline HCL) 0.05% nasal spray - 1 spray in both nostrils daily as needed prior to HBO treatment for difficulty clearing ears ?Electronic Signature(s) ?Signed: 11/05/2021 11:59:26 AM By: [name] MD FACS ?Entered By: [name] on 11/05/2021 11:59:26 ?-------------------------------------------------------------------------------- ?Problem List Details ?Patient Name: ?Date of Service: ?[name]  [name] 11/05/2021 10:00 A M ?Medical Record Number: [id number] ?Patient Account Number: [id number] ?Date of Birth/Sex: ?Treating RN: ?[birth year] (61 y.o. 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 11/05/2021 10:00 A M ?Medical Record Number: [id number] ?Patient Account Number: [id number] ?Date of Birth/Sex: ?Treating RN: ?Jul 05, 1960 (61 y.o. 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 She underwent resection with a fibular ?free flap in followed by a chin implant and tissue rearrangement at [facility] in June 2021. She underwent chemotherapy with cisplatin, completed in ?September  2021. She received radiation therapy of 66 Gray in 33 fractions which was completed in September 2021. She also previously had resection of ?gingival and tongue cancer with a marginal mandibulectomy. This was back in

## 2021-11-05 NOTE — Progress Notes (Signed)
[name] ([id number]) ?Visit Report for 11/05/2021 ?Allergy List Details ?Patient Name: Date of Service: ?[name] 11/05/2021 10:00 A M ?Medical Record Number: [id number] ?Patient Account Number: [id number] ?Date of Birth/Sex: Treating RN: ?Oct 10, 1960 (61 y.o. [name], [name] ?Primary Care Diamonds Lippard: [place], [place] [name] Other Clinician: ?Referring Tatum Corl: ?Treating Epsie Walthall/Extender: [name] ?Colin Benton ?Weeks in Treatment: 0 ?Allergies ?Active Allergies ?codeine ?Reaction: anxiety ?doxycycline HCl ?Reaction: nausea/vomiting ?Sulfa (Sulfonamide Antibiotics) ?Reaction: rash ?Allergy Notes ?Electronic Signature(s) ?Signed: 11/05/2021 5:39:57 PM By: Levan Hurst RN, BSN ?Entered By: Levan Hurst on 11/05/2021 10:19:03 ?-------------------------------------------------------------------------------- ?Arrival Information Details ?Patient Name: Date of Service: ?[name] 11/05/2021 10:00 A M ?Medical Record Number: [id number] ?Patient Account Number: [id number] ?Date of Birth/Sex: Treating RN: ?[birth year] (61 y.o. [name], [name] ?Primary Care Kennedie Pardoe: [place], [place] [name] Other Clinician: ?Referring Roseland Braun: ?Treating Hernan Turnage/Extender: [name] ?Colin Benton ?Weeks in Treatment: 0 ?Visit Information ?Patient Arrived: Ambulatory ?Arrival Time: 10:09 ?Accompanied By: alone ?Transfer Assistance: None ?Patient Identification Verified: Yes ?Secondary Verification Process Completed: Yes ?Patient Requires Transmission-Based Precautions: No ?Patient Has Alerts: No ?Electronic Signature(s) ?Signed: 11/05/2021 5:39:57 PM By: Levan Hurst RN, BSN ?Entered By: Levan Hurst on 11/05/2021 10:10:42 ?-------------------------------------------------------------------------------- ?Clinic Level of Care Assessment Details ?Patient Name: Date of Service: ?[name] 11/05/2021 10:00 A M ?Medical Record Number: [id number] ?Patient Account Number: [id number] ?Date of  Birth/Sex: Treating RN: ?[birth year] (61 y.o. [name], [name] ?Primary Care Ikaika Showers: [place], [place] [name] Other Clinician: ?Referring Teighan Aubert: ?Treating Shabreka Coulon/Extender: [name] ?Colin Benton ?Weeks in Treatment: 0 ?Clinic Level of Care Assessment Items ?TOOL 2 Quantity Score ?X- 1 0 ?Use when only an EandM is performed on the INITIAL visit ?ASSESSMENTS - Nursing Assessment / Reassessment ?X- 1 20 ?General Physical Exam (combine w/ comprehensive assessment (listed just below) when performed on new pt. evals) ?X- 1 25 ?Comprehensive Assessment (HX, ROS, Risk Assessments, Wounds Hx, etc.) ?ASSESSMENTS - Wound and Skin A ssessment / Reassessment ?'[]'$  - 0 ?Simple Wound Assessment / Reassessment - one wound ?'[]'$  - 0 ?Complex Wound Assessment / Reassessment - multiple wounds ?'[]'$  - 0 ?Dermatologic / Skin Assessment (not related to wound area) ?ASSESSMENTS - Ostomy and/or Continence Assessment and Care ?'[]'$  - 0 ?Incontinence Assessment and Management ?'[]'$  - 0 ?Ostomy Care Assessment and Management (repouching, etc.) ?PROCESS - Coordination of Care ?X - Simple Patient / Family Education for ongoing care 1 15 ?'[]'$  - 0 ?Complex (extensive) Patient / Family Education for ongoing care ?X- 1 10 ?Staff obtains Consents, Records, T Results / Process Orders ?est ?'[]'$  - 0 ?Staff telephones HHA, Nursing Homes / Clarify orders / etc ?'[]'$  - 0 ?Routine Transfer to another Facility (non-emergent condition) ?'[]'$  - 0 ?Routine Hospital Admission (non-emergent condition) ?X- 1 15 ?New Admissions / Biomedical engineer / Ordering NPWT Apligraf, etc. ?, ?'[]'$  - 0 ?Emergency Hospital Admission (emergent condition) ?X- 1 10 ?Simple Discharge Coordination ?'[]'$  - 0 ?Complex (extensive) Discharge Coordination ?PROCESS - Special Needs ?'[]'$  - 0 ?Pediatric / Minor Patient Management ?'[]'$  - 0 ?Isolation Patient Management ?'[]'$  - 0 ?Hearing / Language / Visual special needs ?'[]'$  - 0 ?Assessment of Community assistance (transportation, D/C planning,  etc.) ?'[]'$  - 0 ?Additional assistance / Altered mentation ?'[]'$  - 0 ?Support Surface(s) Assessment (bed, cushion, seat, etc.) ?INTERVENTIONS - Wound Cleansing / Measurement ?'[]'$  - 0 ?Wound Imaging (photographs - any number of wounds) ?'[]'$  - 0 ?Wound Tracing (instead of photographs) ?'[]'$  - 0 ?Simple  Wound Measurement - one wound ?'[]'$  - 0 ?Complex Wound Measurement - multiple wounds ?'[]'$  - 0 ?Simple Wound Cleansing - one wound ?'[]'$  - 0 ?Complex Wound Cleansing - multiple wounds ?INTERVENTIONS - Wound Dressings ?'[]'$  - 0 ?Small Wound Dressing one or multiple wounds ?'[]'$  - 0 ?Medium Wound Dressing one or multiple wounds ?'[]'$  - 0 ?Large Wound Dressing one or multiple wounds ?'[]'$  - 0 ?Application of Medications - injection ?INTERVENTIONS - Miscellaneous ?'[]'$  - 0 ?External ear exam ?'[]'$  - 0 ?Specimen Collection (cultures, biopsies, blood, body fluids, etc.) ?'[]'$  - 0 ?Specimen(s) / Culture(s) sent or taken to Lab for analysis ?'[]'$  - 0 ?Patient Transfer (multiple staff / Civil Service fast streamer / Similar devices) ?'[]'$  - 0 ?Simple Staple / Suture removal (25 or less) ?'[]'$  - 0 ?Complex Staple / Suture removal (26 or more) ?'[]'$  - 0 ?Hypo / Hyperglycemic Management (close monitor of Blood Glucose) ?'[]'$  - 0 ?Ankle / Brachial Index (ABI) - do not check if billed separately ?Has the patient been seen at the hospital within the last three years: Yes ?Total Score: 95 ?Level Of Care: New/Established - Level 3 ?Electronic Signature(s) ?Signed: 11/05/2021 5:39:57 PM By: Levan Hurst RN, BSN ?Entered By: Levan Hurst on 11/05/2021 13:52:03 ?-------------------------------------------------------------------------------- ?Encounter Discharge Information Details ?Patient Name: Date of Service: ?[name] 11/05/2021 10:00 A M ?Medical Record Number: [id number] ?Patient Account Number: [id number] ?Date of Birth/Sex: Treating RN: ?[birth year] (61 y.o. [name], [name] ?Primary Care Brighid Koch: [place], [place] [name] Other Clinician: ?Referring Lind Ausley: ?Treating  Dequante Tremaine/Extender: [name] ?Colin Benton ?Weeks in Treatment: 0 ?Encounter Discharge Information Items ?Discharge Condition: Stable ?Ambulatory Status: Ambulatory ?Discharge Destination: Home ?Transportation: Private Auto ?Accompanied By: alone ?Schedule Follow-up Appointment: Yes ?Clinical Summary of Care: Patient Declined ?Electronic Signature(s) ?Signed: 11/05/2021 5:39:57 PM By: Levan Hurst RN, BSN ?Entered By: Levan Hurst on 11/05/2021 13:52:50 ?-------------------------------------------------------------------------------- ?Multi Wound Chart Details ?Patient Name: Date of Service: ?[name] 11/05/2021 10:00 A M ?Medical Record Number: [id number] ?Patient Account Number: [id number] ?Date of Birth/Sex: ?Treating RN: ?February 06, 1961 (61 y.o. [name], [name] ?Primary Care Trenia Tennyson: [place], [place] [name] ?Other Clinician: ?Referring Isaia Hassell: ?Treating Kevia Zaucha/Extender: [name] ?Colin Benton ?Weeks in Treatment: 0 ?Vital Signs ?Height(in): 60 ?Pulse(bpm): 82 ?Weight(lbs): 88 ?Blood Pressure(mmHg): 112/78 ?Body Mass Index(BMI): 17.2 ?Temperature(??F): 98.2 ?Respiratory Rate(breaths/min): 16 ?Wound Assessments ?Treatment Notes ?Electronic Signature(s) ?Signed: 11/05/2021 11:33:47 AM By: [name] MD FACS ?Signed: 11/05/2021 5:39:57 PM By: Levan Hurst RN, BSN ?Entered By: [name] on 11/05/2021 11:33:47 ?-------------------------------------------------------------------------------- ?Multi-Disciplinary Care Plan Details ?Patient Name: ?Date of Service: ?[name] 11/05/2021 10:00 A M ?Medical Record Number: [id number] ?Patient Account Number: [id number] ?Date of Birth/Sex: ?Treating RN: ?[birth year] (61 y.o. 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

## 2021-11-06 ENCOUNTER — Encounter (HOSPITAL_BASED_OUTPATIENT_CLINIC_OR_DEPARTMENT_OTHER): Payer: Medicare Other | Admitting: Internal Medicine

## 2021-11-06 DIAGNOSIS — C411 Malignant neoplasm of mandible: Secondary | ICD-10-CM | POA: Diagnosis not present

## 2021-11-06 DIAGNOSIS — Z923 Personal history of irradiation: Secondary | ICD-10-CM | POA: Diagnosis not present

## 2021-11-06 DIAGNOSIS — M272 Inflammatory conditions of jaws: Secondary | ICD-10-CM | POA: Diagnosis not present

## 2021-11-06 DIAGNOSIS — M8788 Other osteonecrosis, other site: Secondary | ICD-10-CM

## 2021-11-06 NOTE — Progress Notes (Addendum)
KATHLEE, BARNHARDT ([id number]) ?Visit Report for 11/06/2021 ?HBO Details ?Patient Name: Date of Service: ?[name] 11/06/2021 1:00 PM ?Medical Record Number: [id number] ?Patient Account Number: [id number] ?Date of Birth/Sex: Treating RN: ?12-28-1960 (61 y.o. [name], [name] ?Primary Care Tracker Mance: [place], [place] [name] ?Other Clinician: [name] ?Referring Delma Villalva: ?Treating Domonik Levario/Extender: [name] ?[place], [place] [name] ?Weeks in Treatment: 0 ?HBO Treatment Course Details ?Treatment Course Number: 1 ?Ordering Armarion Greek: [name] ?T Treatments Ordered: ?otal 40 HBO Treatment Start Date: 11/06/2021 ?HBO Indication: ?Soft Tissue Radionecrosis to Mandible, Jaw ?HBO Treatment Details ?Treatment Number: 1 ?Patient Type: Outpatient ?Chamber Type: Monoplace ?Chamber Serial #: [id number] ?Treatment Protocol: 2.5 ATA with 90 minutes oxygen, with two 5 minute air breaks ?Treatment Details ?Compression Rate Down: 1.0 psi / minute ?De-Compression Rate Up: 1.0 psi / minute ?A breaks and breathing ?ir ?Compress Tx Pressure periods Decompress Decompress ?Begins Reached (leave unused spaces Begins Ends ?blank) ?Chamber Pressure (ATA 1 2.5 2.5 2.5 2.5 2.5 - - 2.5 1 ?) ?Clock Time (24 hr) 12:53 13:15 13:46 13:51 14:21 14:26 - - 14:56 15:22 ?Treatment Length: 149 (minutes) ?Treatment Segments: 5 ?Vital Signs ?Capillary Blood Glucose Reference Range: 80 - 120 mg / dl ?HBO Diabetic Blood Glucose Intervention Range: <131 mg/dl or >249 mg/dl ?Time Vitals Blood Respiratory Capillary Blood Glucose Pulse Action ?Type: ?Pulse: Temperature: ?Taken: ?Pressure: ?Rate: ?Glucose (mg/dl): ?Meter #: Oximetry (%) Taken: ?Pre 12:33 101/77 88 16 98.6 ?Post 15:23 105/69 71 16 98.3 ?Treatment Response ?Treatment Toleration: Well ?Treatment Completion Status: Treatment Completed without Adverse Event ?Treatment Notes ?Dr. Soyla Dryer saw and evaluated patient prior to her first HBO treatment. She had no issues or complaints today.  Heartregular rate rhythm, lungs clear to ?auscultation to anterior and posterior aspects and tympanic membranes clearly visualized to ears bilaterally ?This is the patient first treatment today. The compression rate was set at 1.0 PSI/MIN. The patient did have a problem clearing her ears at 3 PSI. Travel was ?stopped, the pressure was lowered to 2 PSI, and the patient was able to clear at that time without any more problems. The compression was restarted at 1.0 ?PSI/min. I asked her if she was able to clear at 1,2,3,4,5,6,7,8,9,10,11,12,13,14,15,16,17,18,19,20 PSI, and 2.5 ATA. The only time she had a problem was at 3 ?PSI, and was able to clear. ?The treatment went well with no other problems. ?Decompression was set at 1.0, and she had no problems. ?I spoke with Dr. Heber Glasgow after to see if she would like to see the patient before discharge. She stated no, if she did not have any complaints or concerns. ?Physician HBO Attestation: ?I certify that I supervised this HBO treatment in accordance with Medicare ?guidelines. A trained emergency response team is readily available per Yes ?hospital policies and procedures. ?Continue HBOT as ordered. Yes ?Electronic Signature(s) ?Signed: 11/07/2021 12:44:21 PM By: [name] DO ?Previous Signature: 11/06/2021 4:54:21 PM Version By: [name] EMT ?Entered By: [name] on 11/07/2021 12:43:35 ?-------------------------------------------------------------------------------- ?HBO Safety Checklist Details ?Patient Name: ?Date of Service: ?[name] 11/06/2021 1:00 PM ?Medical Record Number: [id number] ?Patient Account Number: [id number] ?Date of Birth/Sex: ?Treating RN: ?12-16-1960 (61 y.o. [name], [name] ?Primary Care Cornelius Schuitema: [place], [place] [name] ?Other Clinician: [name] ?Referring Naja Apperson: ?Treating Davidjames Blansett/Extender: [name] ?[place], [place] [name] ?Weeks in Treatment: 0 ?HBO Safety Checklist Items ?Safety Checklist ?Consent Form  Signed ?Patient voided / foley secured and emptied ?When did you last eato 1030 ?Last dose  of injectable or oral agent NA ?Ostomy pouch emptied and vented if applicable ?NA ?All implantable devices assessed, documented and approved Wire mesh in mouth ?Intravenous access site secured and place Power Port ?Valuables secured ?Linens and cotton and cotton/polyester blend (less than 51% polyester) ?Personal oil-based products / skin lotions / body lotions removed ?Wigs or hairpieces removed ?NA ?Smoking or tobacco materials removed ?Books / newspapers / magazines / loose paper removed ?Cologne, aftershave, perfume and deodorant removed ?Jewelry removed (may wrap wedding band) ?NA ?Make-up removed ?Hair care products removed ?Battery operated devices (external) removed ?Heating patches and chemical warmers removed ?Titanium eyewear removed ?NA ?Nail polish cured greater than 10 hours Friday 11/02/2021 ?Casting material cured greater than 10 hours ?NA ?Hearing aids removed ?NA ?Loose dentures or partials removed ?NA ?Prosthetics have been removed ?NA ?Patient demonstrates correct use of air break device (if applicable) ?Patient concerns have been addressed ?Patient grounding bracelet on and cord attached to chamber ?Specifics for Inpatients (complete in addition to above) ?Medication sheet sent with patient ?NA ?Intravenous medications needed or due during therapy sent with patient ?NA ?Drainage tubes (e.g. nasogastric tube or chest tube secured and vented) ?NA ?Endotracheal or Tracheotomy tube secured ?NA ?Cuff deflated of air and inflated with saline ?NA ?Airway suctioned ?NA ?Notes ?The safety checklist was done before treatment started. ?Electronic Signature(s) ?Signed: 11/06/2021 4:39:37 PM By: [name] EMT ?Entered By: [name] on 11/06/2021 16:39:37 ?

## 2021-11-06 NOTE — Progress Notes (Signed)
JOSSETTE, ZIRBEL ([id number]) ?Visit Report for 11/06/2021 ?Arrival Information Details ?Patient Name: Date of Service: ?[name] 11/06/2021 1:00 PM ?Medical Record Number: [id number] ?Patient Account Number: [id number] ?Date of Birth/Sex: Treating RN: ?1960/09/15 (61 y.o. [name], [name] ?Primary Care [name]: [place] ?Other Clinician: Valeria Dillon ?Referring [name]: ?Treating [name]/Extender: [name] ?[place] ?Weeks in Treatment: 0 ?Visit Information History Since Last Visit ?All ordered tests and consults were completed: Yes ?Patient Arrived: Ambulatory ?Added or deleted any medications: No ?Arrival Time: 12:23 ?Any new allergies or adverse reactions: No ?Accompanied By: None ?Had a fall or experienced change in No ?Transfer Assistance: None ?activities of daily living that may affect ?Patient Identification Verified: Yes ?risk of falls: ?Secondary Verification Process Completed: Yes ?Signs or symptoms of abuse/neglect since last visito No ?Patient Requires Transmission-Based Precautions: No ?Hospitalized since last visit: No ?Patient Has Alerts: No ?Implantable device outside of the clinic excluding No ?cellular tissue based products placed in the center ?since last visit: ?Pain Present Now: No ?Electronic Signature(s) ?Signed: 11/06/2021 4:29:51 PM By: [name] EMT ?Entered By: [name] on 11/06/2021 16:29:51 ?-------------------------------------------------------------------------------- ?Encounter Discharge Information Details ?Patient Name: Date of Service: ?[name] 11/06/2021 1:00 PM ?Medical Record Number: [id number] ?Patient Account Number: [id number] ?Date of Birth/Sex: Treating RN: ?11-02-1960 (61 y.o. [name], [name] ?Primary Care [name]: [place] ?Other Clinician: Valeria Dillon ?Referring [name]: ?Treating [name]/Extender: [name] ?[place] ?Weeks in Treatment: 0 ?Encounter Discharge  Information Items ?Discharge Condition: Stable ?Ambulatory Status: Ambulatory ?Discharge Destination: Home ?Transportation: Private Auto ?Accompanied By: None ?Schedule Follow-up Appointment: Yes ?Clinical Summary of Care: ?Electronic Signature(s) ?Signed: 11/06/2021 4:57:18 PM By: [name] EMT ?Entered By: [name] on 11/06/2021 16:57:18 ?-------------------------------------------------------------------------------- ?Vitals Details ?Patient Name: ?Date of Service: ?[name] 11/06/2021 1:00 PM ?Medical Record Number: [id number] ?Patient Account Number: [id number] ?Date of Birth/Sex: ?Treating RN: ?[birth year] (61 y.o. [name], [name] ?Primary Care [name]: [place] ?Other Clinician: Valeria Dillon ?Referring [name]: ?Treating [name]/Extender: [name] ?[place] ?Weeks in Treatment: 0 ?Vital Signs ?Time Taken: 12:33 ?Temperature (??F): 98.6 ?Height (in): 60 ?Pulse (bpm): 88 ?Weight (lbs): 88 ?Respiratory Rate (breaths/min): 16 ?Body Mass Index (BMI): 17.2 ?Blood Pressure (mmHg): 101/77 ?Reference Range: 80 - 120 mg / dl ?Notes ?[name] saw the patient before treatment started. [name]  informed of the patient's blood pressure. ?Electronic Signature(s) ?Signed: 11/06/2021 4:35:56 PM By: [name] EMT ?Entered By: [name] on 11/06/2021 16:35:56 ?

## 2021-11-07 ENCOUNTER — Encounter (HOSPITAL_BASED_OUTPATIENT_CLINIC_OR_DEPARTMENT_OTHER): Payer: Medicare Other | Admitting: General Surgery

## 2021-11-07 DIAGNOSIS — M272 Inflammatory conditions of jaws: Secondary | ICD-10-CM | POA: Diagnosis not present

## 2021-11-07 NOTE — Progress Notes (Addendum)
MALYSSA, MARIS ([id number]) ?Visit Report for 11/07/2021 ?Arrival Information Details ?Patient Name: Date of Service: ?[name] 11/07/2021 1:00 PM ?Medical Record Number: [id number] ?Patient Account Number: [id number] ?Date of Birth/Sex: Treating RN: ?October 28, 1960 (61 y.o. [name], [name] ?Primary Care Keyana Guevara: [name] ?Other Clinician: [name] ?Referring Macon Sandiford: ?Treating Oumou Smead/Extender: [name] ?[name] ?Weeks in Treatment: 0 ?Visit Information History Since Last Visit ?All ordered tests and consults were completed: Yes ?Patient Arrived: Ambulatory ?Added or deleted any medications: No ?Arrival Time: 12:36 ?Any new allergies or adverse reactions: No ?Accompanied By: None ?Had a fall or experienced change in No ?Transfer Assistance: None ?activities of daily living that may affect ?Patient Identification Verified: Yes ?risk of falls: ?Secondary Verification Process Completed: Yes ?Signs or symptoms of abuse/neglect since last visito No ?Patient Requires Transmission-Based Precautions: No ?Hospitalized since last visit: No ?Patient Has Alerts: No ?Implantable device outside of the clinic excluding No ?cellular tissue based products placed in the center ?since last visit: ?Pain Present Now: No ?Electronic Signature(s) ?Signed: 11/07/2021 4:11:03 PM By: [name] EMT ?Entered By: [name] on 11/07/2021 16:11:02 ?-------------------------------------------------------------------------------- ?Encounter Discharge Information Details ?Patient Name: Date of Service: ?[name] 11/07/2021 1:00 PM ?Medical Record Number: [id number] ?Patient Account Number: [id number] ?Date of Birth/Sex: Treating RN: ?[birth year] (61 y.o. [name], [name] ?Primary Care Iszabella Hebenstreit: [name] ?Other Clinician: [name] ?Referring Paisleigh [name]: ?Treating Jartavious Mckimmy/Extender: [name] ?[name] ?Weeks in Treatment: 0 ?Encounter Discharge  Information Items ?Discharge Condition: Stable ?Ambulatory Status: Ambulatory ?Discharge Destination: Home ?Transportation: Private Auto ?Accompanied By: None ?Schedule Follow-up Appointment: No ?Clinical Summary of Care: ?Electronic Signature(s) ?Signed: 11/07/2021 4:39:44 PM By: [name] EMT ?Entered By: [name] on 11/07/2021 16:39:44 ?-------------------------------------------------------------------------------- ?Vitals Details ?Patient Name: ?Date of Service: ?[name] 11/07/2021 1:00 PM ?Medical Record Number: [id number] ?Patient Account Number: [id number] ?Date of Birth/Sex: ?Treating RN: ?Oct 18, 1960 (61 y.o. [name], [name] ?Primary Care Braun Rocca: [name] ?Other Clinician: [name] ?Referring Ripken Rekowski: ?Treating Hina Gupta/Extender: [name] ?[name] ?Weeks in Treatment: 0 ?Vital Signs ?Time Taken: 12:44 ?Temperature (??F): 98.0 ?Height (in): 60 ?Pulse (bpm): 69 ?Weight (lbs): 88 ?Respiratory Rate (breaths/min): 16 ?Body Mass Index (BMI): 17.2 ?Blood Pressure (mmHg): 104/82 ?Reference Range: 80 - 120 mg / dl ?Electronic Signature(s) ?Signed: 11/07/2021 4:11:31 PM By: [name] EMT ?Entered By: [name] on 11/07/2021 16:11:31 ?

## 2021-11-07 NOTE — Progress Notes (Addendum)
[name], [name] ([id number]) ?Visit Report for 11/07/2021 ?HBO Details ?Patient Name: Date of Service: ?Theresa LLA [name] 11/07/2021 1:00 PM ?Medical Record Number: [id number] ?Patient Account Number: [id number] ?Date of Birth/Sex: Treating RN: ?01/20/1961 (61 y.o. 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, Jaw ?HBO Treatment Details ?Treatment Number: 2 ?Patient Type: Outpatient ?Chamber Type: Monoplace ?Chamber Serial #: [id number] ?Treatment Protocol: 2.5 ATA with 90 minutes oxygen, with two 5 minute air breaks ?Treatment Details ?Compression Rate Down: 1.0 psi / minute ?De-Compression Rate Up: 1.0 psi / minute ?A breaks and breathing ?ir ?Compress Tx Pressure periods Decompress Decompress ?Begins Reached (leave unused spaces Begins Ends ?blank) ?Chamber Pressure (ATA 1 2.5 2.5 2.5 2.5 2.5 - - 2.5 1 ?) ?Clock Time (24 hr) 12:56 13:19 13:49 13:55 14:25 14:30 - - 14:59 15:23 ?Treatment Length: 147 (minutes) ?Treatment Segments: 5 ?Vital Signs ?Capillary Blood Glucose Reference Range: 80 - 120 mg / dl ?HBO Diabetic Blood Glucose Intervention Range: <131 mg/dl or >249 mg/dl ?Time Vitals Blood Respiratory Capillary Blood Glucose Pulse Action ?Type: ?Pulse: Temperature: ?Taken: ?Pressure: ?Rate: ?Glucose (mg/dl): ?Meter #: Oximetry (%) Taken: ?Pre 12:44 104/82 69 16 98 ?Post 15:29 104/82 69 16 98 ?Treatment Response ?Treatment Toleration: Well ?Treatment Completion Status: Treatment Completed without Adverse Event ?Treatment Notes ?Before starting treatment today, I asked the patient Today, if she had any problems with her ears or sinus today.  She stated no. ?Today was the patient second treatment. The compression rate was set at 1.0 PSI/min to 2.5 ATA. She did not have any problems clearing her ears today. Her ?treatment went well. ?The decompression rate was set at 1.0. After her treatment, I asked her about her ears and sinus. She stated they were fine. ?Physician HBO Attestation: ?I certify that I supervised this HBO treatment in accordance with Medicare ?guidelines. A trained emergency response team is readily available per Yes ?hospital policies and procedures. ?Continue HBOT as ordered. Yes ?Electronic Signature(s) ?Signed: 11/07/2021 5:20:32 PM By: [name] ?Previous Signature: 11/07/2021 4:37:08 PM Version By: [name] EMT ?Entered By: [name] on 11/07/2021 17:20:32 ?-------------------------------------------------------------------------------- ?HBO Safety Checklist Details ?Patient Name: ?Date of Service: ?Theresa LLA HA Theresa Bienenstock D. 11/07/2021 1:00 PM ?Medical Record Number: [id number] ?Patient Account Number: [id number] ?Date of Birth/Sex: ?Treating RN: ?[birth year] (61 y.o. [name], [name] ?Primary Care [name]: [name] ?Other Clinician: Valeria Dillon ?Referring Sharnetta Gielow: ?Treating [name]/Extender: [name] ?[name] ?Weeks in Treatment: 0 ?HBO Safety Checklist Items ?Safety Checklist ?Consent Form Signed ?Patient voided / foley secured and emptied ?When did you last eato 1130 ?Last dose of injectable or oral agent NA ?Ostomy pouch emptied and vented if applicable ?NA ?All implantable devices assessed, documented and approved Wire mesh in mouth, Power Port ?Intravenous access site secured and place Power Port ?Valuables secured ?Linens and cotton and cotton/polyester blend (less than 51% polyester) ?Personal oil-based products / skin lotions / body lotions removed ?Wigs or hairpieces removed ?NA ?Smoking or tobacco materials removed ?Books / newspapers / magazines / loose paper  removed ?Cologne, aftershave, perfume and deodorant removed ?Jewelry removed (may wrap wedding band) ?NA ?Make-up removed ?Hair care products removed ?Battery operated devices (  external) removed ?Heating patches and chemical warmers removed ?Titanium eyewear removed ?NA ?Nail polish cured greater than 10 hours Friday 11/02/2021 ?Casting material cured greater than 10 hours ?NA ?Hearing aids removed ?NA ?Loose dentures or partials removed ?NA ?Prosthetics have been removed ?Patient demonstrates correct use of air break device (if applicable) ?Patient concerns have been addressed ?Patient grounding bracelet on and cord attached to chamber ?Specifics for Inpatients (complete in addition to above) ?Medication sheet sent with patient ?NA ?Intravenous medications needed or due during therapy sent with patient ?NA ?Drainage tubes (e.g. nasogastric tube or chest tube secured and vented) ?NA ?Endotracheal or Tracheotomy tube secured ?NA ?Cuff deflated of air and inflated with saline ?NA ?Airway suctioned ?NA ?Notes ?The safety checklist was done before treatment started. ?Electronic Signature(s) ?Signed: 11/07/2021 4:13:23 PM By: [name] EMT ?Entered By: [name] on 11/07/2021 16:13:22 ?

## 2021-11-07 NOTE — Progress Notes (Signed)
EMMERIE, BATTAGLIA ([id number]) ?Visit Report for 11/07/2021 ?Problem List Details ?Patient Name: Date of Service: ?[name] 11/07/2021 1:00 PM ?Medical Record Number: [id number] ?Patient Account Number: [id number] ?Date of Birth/Sex: Treating RN: ?December 22, 1960 (61 y.o. 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 11/07/2021 ?Medical Record Number: [id number] ?Patient Account Number: [id number] ?Date of Birth/Sex: Treating RN: ?1960/09/13 (61 y.o. 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, unspecified ?Facility Procedures ?CPT4 Code: 02542706 ?Description: G0277-(Facility Use Only) HBOT full body chamber,  27mn , ICD-10 Diagnosis Description 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 mandible ?Quantity: 1 ?Electronic Signature(s) ?Signed: 11/07/2021 4:38:33 PM By: GValeria BatmanEMT ?Signed: 11/07/2021 5:19:03 PM By: CFredirick MaudlinMD FACS ?Entered By: GValeria Batmanon 11/07/2021 16:38:32 ?

## 2021-11-07 NOTE — Progress Notes (Signed)
JERIAH, SKUFCA ([id number]) ?Visit Report for 11/06/2021 ?Problem List Details ?Patient Name: Date of Service: ?[name] 11/06/2021 1:00 PM ?Medical Record Number: [id number] ?Patient Account Number: [id number] ?Date of Birth/Sex: Treating RN: ?1961/03/02 (61 y.o. 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 11/06/2021 ?Medical Record Number: [id number] ?Patient Account Number: [id number] ?Date of Birth/Sex: Treating RN: ?29-Aug-1960 (61 y.o. 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, unspecified ?Facility Procedures ?CPT4 Code: 29798921 ?Description: G0277-(Facility Use Only) HBOT full body chamber, 19mn ,  ICD-10 Diagnosis Description 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 mandible ?Quantity: 1 ?Electronic Signature(s) ?Signed: 11/06/2021 4:56:35 PM By: GValeria BatmanEMT ?Signed: 11/07/2021 12:44:21 PM By: HKalman ShanDO ?Entered By: GValeria Batmanon 11/06/2021 16:56:35 ?

## 2021-11-08 ENCOUNTER — Encounter (HOSPITAL_BASED_OUTPATIENT_CLINIC_OR_DEPARTMENT_OTHER): Payer: Medicare Other | Admitting: General Surgery

## 2021-11-08 DIAGNOSIS — M272 Inflammatory conditions of jaws: Secondary | ICD-10-CM | POA: Diagnosis not present
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
## 2021-11-08 NOTE — Progress Notes (Signed)
[name], [name] ([id number]) ?Visit Report for 11/08/2021 ?Problem List Details ?Patient Name: Date of Service: ?[name] 11/08/2021 1:00 PM ?Medical Record Number: [id number] ?Patient Account Number: [id number] ?Date of Birth/Sex: Treating RN: ?[birth year] (61 y.o. 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 11/08/2021 ?Medical Record Number: [id number] ?Patient Account Number: [id number] ?Date of Birth/Sex: Treating RN: ?1961/05/15 (61 y.o. 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, unspecified ?Facility Procedures ?CPT4 Code: 30076226 ?Description: G0277-(Facility Use Only) HBOT full body chamber,  38mn , ICD-10 Diagnosis Description 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 mandible ?Quantity: 1 ?Electronic Signature(s) ?Signed: 11/08/2021 3:58:41 PM By: GValeria BatmanEMT ?Signed: 11/08/2021 4:14:12 PM By: CFredirick MaudlinMD FACS ?Previous Signature: 11/08/2021 3:52:53 PM Version By: GValeria BatmanEMT ?Entered By: GValeria Batmanon 11/08/2021 15:58:41 ?

## 2021-11-08 NOTE — Progress Notes (Addendum)
[name], [name] ([id number]) ?Visit Report for 11/08/2021 ?Arrival Information Details ?Patient Name: Date of Service: ?[name] 11/08/2021 1:00 PM ?Medical Record Number: [id number] ?Patient Account Number: [id number] ?Date of Birth/Sex: Treating RN: ?August 25, 1960 (61 y.o. [name] ?Primary Care Ukiah Trawick: [name] ?Other Clinician: [name] ?Referring Avante Carneiro: ?Treating Cadden Elizondo/Extender: [name] ?[name] ?Weeks in Treatment: 0 ?Visit Information History Since Last Visit ?All ordered tests and consults were completed: Yes ?Patient Arrived: Ambulatory ?Added or deleted any medications: No ?Arrival Time: 12:32 ?Any new allergies or adverse reactions: No ?Accompanied By: None ?Had a fall or experienced change in No ?Transfer Assistance: None ?activities of daily living that may affect ?Patient Identification Verified: Yes ?risk of falls: ?Secondary Verification Process Completed: Yes ?Signs or symptoms of abuse/neglect since last visito No ?Patient Requires Transmission-Based Precautions: No ?Hospitalized since last visit: No ?Patient Has Alerts: No ?Implantable device outside of the clinic excluding No ?cellular tissue based products placed in the center ?since last visit: ?Pain Present Now: No ?Electronic Signature(s) ?Signed: 11/08/2021 3:33:09 PM By: [name] EMT ?Previous Signature: 11/08/2021 3:33:03 PM Version By: [name] EMT ?Entered By: [name] on 11/08/2021 15:33:09 ?-------------------------------------------------------------------------------- ?Encounter Discharge Information Details ?Patient Name: Date of Service: ?[name] 11/08/2021 1:00 PM ?Medical Record Number: [id number] ?Patient Account Number: [id number] ?Date of Birth/Sex: Treating RN: ?1961/04/09 (61 y.o. [name] ?Primary Care Zylah Elsbernd: [name] ?Other Clinician: [name] ?Referring Elleigh Cassetta: ?Treating Krystall Kruckenberg/Extender: [name] [name] ?Weeks in Treatment: 0 ?Encounter Discharge Information Items ?Discharge Condition: Stable ?Ambulatory Status: Ambulatory ?Discharge Destination: Home ?Transportation: Private Auto ?Accompanied By: None ?Schedule Follow-up Appointment: Yes ?Clinical Summary of Care: ?Electronic Signature(s) ?Signed: 11/08/2021 3:59:18 PM By: [name] EMT ?Entered By: [name] on 11/08/2021 15:59:18 ?-------------------------------------------------------------------------------- ?Vitals Details ?Patient Name: ?Date of Service: ?[name] 11/08/2021 1:00 PM ?Medical Record Number: [id number] ?Patient Account Number: [id number] ?Date of Birth/Sex: ?Treating RN: ?Jun 04, 1961 (61 y.o. [name] ?Primary Care Cora Brierley: [name] ?Other Clinician: [name] ?Referring Daxter Paule: ?Treating Danzig Macgregor/Extender: [name] ?[name] ?Weeks in Treatment: 0 ?Vital Signs ?Time Taken: 12:41 ?Temperature (??F): 98.8 ?Height (in): 60 ?Pulse (bpm): 79 ?Weight (lbs): 88 ?Respiratory Rate (breaths/min): 14 ?Body Mass Index (BMI): 17.2 ?Blood Pressure (mmHg): 105/73 ?Reference Range: 80 - 120 mg / dl ?Electronic Signature(s) ?Signed: 11/08/2021 3:33:45 PM By: [name] EMT ?Entered By: [name] on 11/08/2021 15:33:44 ?

## 2021-11-09 ENCOUNTER — Encounter (HOSPITAL_BASED_OUTPATIENT_CLINIC_OR_DEPARTMENT_OTHER): Payer: Medicare Other | Admitting: General Surgery

## 2021-11-09 DIAGNOSIS — M272 Inflammatory conditions of jaws: Secondary | ICD-10-CM | POA: Diagnosis not present

## 2021-11-09 NOTE — Progress Notes (Addendum)
Theresa, Dillon ([id number]) ?Visit Report for 11/09/2021 ?HBO Details ?Patient Name: Date of Service: ?[name] 11/09/2021 1:00 PM ?Medical Record Number: [id number] ?Patient Account Number: [id number] ?Date of Birth/Sex: Treating RN: ?[birth year] (61 y.o. 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, Jaw ?HBO Treatment Details ?Treatment Number: 4 ?Patient Type: Outpatient ?Chamber Type: Monoplace ?Chamber Serial #: [id number] ?Treatment Protocol: 2.5 ATA with 90 minutes oxygen, with two 5 minute air breaks ?Treatment Details ?Compression Rate Down: 2.0 psi / minute ?De-Compression Rate Up: 2.0 psi / minute ?A breaks and breathing ?ir ?Compress Tx Pressure periods Decompress Decompress ?Begins Reached (leave unused spaces Begins Ends ?blank) ?Chamber Pressure (ATA 1 2.5 2.5 2.5 2.5 2.5 - - 2.5 1 ?) ?Clock Time (24 hr) 12:56 13:07 13:38 13:43 14:13 14:18 - - 14:48 15:00 ?Treatment Length: 124 (minutes) ?Treatment Segments: 4 ?Vital Signs ?Capillary Blood Glucose Reference Range: 80 - 120 mg / dl ?HBO Diabetic Blood Glucose Intervention Range: <131 mg/dl or >249 mg/dl ?Time Vitals Blood Respiratory Capillary Blood Glucose Pulse Action ?Type: ?Pulse: Temperature: ?Taken: ?Pressure: ?Rate: ?Glucose (mg/dl): ?Meter #: Oximetry (%) Taken: ?Pre 12:45 108/67 80 16 98.6 ?Post 15:08 109/73 67 14 98.4 ?Treatment Response ?Treatment Toleration: Well ?Treatment Completion Status: Treatment Completed without Adverse Event ?Physician HBO Attestation: ?I certify that I supervised this HBO treatment in accordance with Medicare ?guidelines. A trained  emergency response team is readily available per Yes ?hospital policies and procedures. ?Continue HBOT as ordered. Yes ?Electronic Signature(s) ?Signed: 11/09/2021 3:51:40 PM By: Theresa Maudlin MD FACS ?Previous Signature: 11/09/2021 3:42:58 PM Version By: [name] EMT ?Previous Signature: 11/09/2021 1:45:27 PM Version By: [name] EMT ?Entered By: [name] on 11/09/2021 15:51:39 ?-------------------------------------------------------------------------------- ?HBO Safety Checklist Details ?Patient Name: ?Date of Service: ?[name] 11/09/2021 1:00 PM ?Medical Record Number: [id number] ?Patient Account Number: [id number] ?Date of Birth/Sex: ?Treating RN: ?1961-03-06 (61 y.o. [name], [name] ?Primary Care [name]: [name] ?Other Clinician: Valeria Dillon ?Referring [name]: ?Treating [name]/Extender: [name] ?[name] ?Weeks in Treatment: 0 ?HBO Safety Checklist Items ?Safety Checklist ?Consent Form Signed ?Patient voided / foley secured and emptied ?When did you last eato 1130 ?Last dose of injectable or oral agent NA ?Ostomy pouch emptied and vented if applicable ?NA ?All implantable devices assessed, documented and approved Power Port ?Intravenous access site secured and place Power Port ?Valuables secured ?Linens and cotton and cotton/polyester blend (less than 51% polyester) ?Personal oil-based products / skin lotions / body lotions removed ?Wigs or hairpieces removed ?NA ?Smoking or tobacco materials removed ?Books / newspapers / magazines / loose paper removed ?Cologne, aftershave, perfume and deodorant removed ?Jewelry removed (may wrap wedding band) ?NA ?Make-up removed ?Hair care products removed ?Battery operated devices (external) removed ?Heating patches and chemical warmers removed ?Titanium eyewear removed ?NA ?Nail polish cured greater than 10 hours 11/02/2021 ?Casting material cured greater than 10 hours ?NA ?Hearing aids  removed ?NA ?Loose dentures or partials removed ?NA ?Prosthetics have been removed ?NA ?Patient demonstrates correct use of air break device (if applicable) ?Patient concerns have been addressed ?Patient grounding bracelet on and cord attached to chamber ?Specifics for Inpatients (complete in addition  to above) ?Medication sheet sent with patient ?NA ?Intravenous medications needed or due during therapy sent with patient ?NA ?Drainage tubes (e.g. nasogastric tube or chest tube secured and vented) ?NA ?Endotracheal or Tracheotomy tube secured ?NA ?Cuff deflated of air and inflated with saline ?NA ?Airway suctioned ?NA ?Notes ?The safety checklist was done before treatment started. ?Electronic Signature(s) ?Signed: 11/09/2021 1:44:26 PM By: [name] EMT ?Entered By: [name] on 11/09/2021 13:44:25 ?

## 2021-11-09 NOTE — Progress Notes (Addendum)
BRENYA, TAULBEE ([id number]) ?Visit Report for 11/09/2021 ?Arrival Information Details ?Patient Name: Date of Service: ?[name] 11/09/2021 1:00 PM ?Medical Record Number: [id number] ?Patient Account Number: [id number] ?Date of Birth/Sex: Treating RN: ?1960/08/07 (61 y.o. [name], [name] ?Primary Care Ami Thornsberry: [name] ?Other Clinician: [name] ?Referring Eniya Cannady: ?Treating Shylo Dillenbeck/Extender: [name] [name] ?Weeks in Treatment: 0 ?Visit Information History Since Last Visit ?All ordered tests and consults were completed: Yes ?Patient Arrived: Ambulatory ?Added or deleted any medications: No ?Arrival Time: 12:34 ?Any new allergies or adverse reactions: No ?Accompanied By: None ?Had a fall or experienced change in No ?Transfer Assistance: None ?activities of daily living that may affect ?Patient Identification Verified: Yes ?risk of falls: ?Secondary Verification Process Completed: Yes ?Signs or symptoms of abuse/neglect since last visito No ?Patient Requires Transmission-Based Precautions: No ?Hospitalized since last visit: No ?Patient Has Alerts: No ?Implantable device outside of the clinic excluding No ?cellular tissue based products placed in the center ?since last visit: ?Pain Present Now: No ?Electronic Signature(s) ?Signed: 11/09/2021 1:39:10 PM By: [name] EMT ?Entered By: [name] on 11/09/2021 13:39:10 ?-------------------------------------------------------------------------------- ?Encounter Discharge Information Details ?Patient Name: Date of Service: ?[name] 11/09/2021 1:00 PM ?Medical Record Number: [id number] ?Patient Account Number: [id number] ?Date of Birth/Sex: Treating RN: ?02-Nov-1960 (61 y.o. [name], [name] ?Primary Care Sharnice Bosler: [name] ?Other Clinician: [name] ?Referring Leathia Farnell: ?Treating Karmyn Lowman/Extender: [name] [name] ?Weeks in Treatment: 0 ?Encounter Discharge  Information Items ?Discharge Condition: Stable ?Ambulatory Status: Ambulatory ?Discharge Destination: Home ?Transportation: Private Auto ?Accompanied By: None ?Schedule Follow-up Appointment: No ?Clinical Summary of Care: ?Electronic Signature(s) ?Signed: 11/09/2021 3:44:10 PM By: [name] EMT ?Entered By: [name] on 11/09/2021 15:44:09 ?-------------------------------------------------------------------------------- ?Vitals Details ?Patient Name: ?Date of Service: ?[name] 11/09/2021 1:00 PM ?Medical Record Number: [id number] ?Patient Account Number: [id number] ?Date of Birth/Sex: ?Treating RN: ?[birth year] (61 y.o. [name], [name] ?Primary Care Umi Mainor: [name] ?Other Clinician: [name] ?Referring Shyanne Mcclary: ?Treating Demonica Farrey/Extender: [name] [name] ?Weeks in Treatment: 0 ?Vital Signs ?Time Taken: 12:45 ?Temperature (??F): 98.6 ?Height (in): 60 ?Pulse (bpm): 80 ?Weight (lbs): 88 ?Respiratory Rate (breaths/min): 16 ?Body Mass Index (BMI): 17.2 ?Blood Pressure (mmHg): 108/67 ?Reference Range: 80 - 120 mg / dl ?Electronic Signature(s) ?Signed: 11/09/2021 1:40:19 PM By: [name] EMT ?Entered By: [name] on 11/09/2021 13:40:19 ?

## 2021-11-09 NOTE — Progress Notes (Signed)
CAMELLE, HENKELS ([id number]) ?Visit Report for 11/09/2021 ?Problem List Details ?Patient Name: Date of Service: ?[name] 11/09/2021 1:00 PM ?Medical Record Number: [id number] ?Patient Account Number: [id number] ?Date of Birth/Sex: Treating RN: ?[birth year] (61 y.o. 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 11/09/2021 ?Medical Record Number: [id number] ?Patient Account Number: [id number] ?Date of Birth/Sex: Treating RN: ?20-Jul-1960 (61 y.o. 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, unspecified ?Facility Procedures ?CPT4 Code: 30076226 ?Description: G0277-(Facility Use Only) HBOT full body chamber,  81mn , ICD-10 Diagnosis Description 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 mandible ?Quantity: 1 ?Electronic Signature(s) ?Signed: 11/09/2021 3:43:31 PM By: GValeria BatmanEMT ?Signed: 11/09/2021 3:51:57 PM By: CFredirick MaudlinMD FACS ?Entered By: GValeria Batmanon 11/09/2021 15:43:31 ?

## 2021-11-12 ENCOUNTER — Encounter (HOSPITAL_BASED_OUTPATIENT_CLINIC_OR_DEPARTMENT_OTHER): Payer: Medicare Other | Admitting: Internal Medicine

## 2021-11-12 DIAGNOSIS — C411 Malignant neoplasm of mandible: Secondary | ICD-10-CM

## 2021-11-12 DIAGNOSIS — M272 Inflammatory conditions of jaws: Secondary | ICD-10-CM | POA: Diagnosis not present

## 2021-11-12 DIAGNOSIS — Z923 Personal history of irradiation: Secondary | ICD-10-CM | POA: Diagnosis not present

## 2021-11-12 DIAGNOSIS — M8788 Other osteonecrosis, other site: Secondary | ICD-10-CM | POA: Diagnosis not present

## 2021-11-12 NOTE — Progress Notes (Addendum)
HONOUR, SCHWIEGER ([id number]) ?Visit Report for 11/12/2021 ?Arrival Information Details ?Patient Name: Date of Service: ?[name] 11/12/2021 1:00 PM ?Medical Record Number: [id number] ?Patient Account Number: [id number] ?Date of Birth/Sex: Treating RN: ?1961-03-08 (61 y.o. [name], [name] ?Primary Care Shawntae Lowy: [name] ?Other Clinician: [name] ?Referring Sayyid Harewood: ?Treating Krew Hortman/Extender: [name] [name] ?Weeks in Treatment: 1 ?Visit Information History Since Last Visit ?All ordered tests and consults were completed: Yes ?Patient Arrived: Ambulatory ?Added or deleted any medications: No ?Arrival Time: 12:25 ?Any new allergies or adverse reactions: No ?Accompanied By: None ?Had a fall or experienced change in No ?Transfer Assistance: None ?activities of daily living that may affect ?Patient Identification Verified: Yes ?risk of falls: ?Secondary Verification Process Completed: Yes ?Signs or symptoms of abuse/neglect since last visito No ?Patient Requires Transmission-Based Precautions: No ?Hospitalized since last visit: No ?Patient Has Alerts: No ?Implantable device outside of the clinic excluding No ?cellular tissue based products placed in the center ?since last visit: ?Pain Present Now: No ?Electronic Signature(s) ?Signed: 11/12/2021 3:30:48 PM By: [name] EMT ?Entered By: [name] on 11/12/2021 15:30:48 ?-------------------------------------------------------------------------------- ?Encounter Discharge Information Details ?Patient Name: Date of Service: ?[name] 11/12/2021 1:00 PM ?Medical Record Number: [id number] ?Patient Account Number: [id number] ?Date of Birth/Sex: Treating RN: ?[birth year] (61 y.o. [name], [name] ?Primary Care Takyia Sindt: [name] ?Other Clinician: [name] ?Referring Dajanee Voorheis: ?Treating Lon Klippel/Extender: [name] [name] ?Weeks in Treatment: 1 ?Encounter Discharge  Information Items ?Discharge Condition: Stable ?Ambulatory Status: Ambulatory ?Discharge Destination: Home ?Transportation: Private Auto ?Accompanied By: None ?Schedule Follow-up Appointment: Yes ?Clinical Summary of Care: ?Electronic Signature(s) ?Signed: 11/12/2021 3:39:44 PM By: [name] EMT ?Entered By: [name] on 11/12/2021 15:39:44 ?-------------------------------------------------------------------------------- ?Vitals Details ?Patient Name: ?Date of Service: ?[name] 11/12/2021 1:00 PM ?Medical Record Number: [id number] ?Patient Account Number: [id number] ?Date of Birth/Sex: ?Treating RN: ?09/24/1960 (61 y.o. [name], [name] ?Primary Care Pedro Oldenburg: [name] ?Other Clinician: [name] ?Referring Gio Janoski: ?Treating Briggs Edelen/Extender: [name] ?[name] ?Weeks in Treatment: 1 ?Vital Signs ?Time Taken: 12:38 ?Temperature (??F): 97.4 ?Height (in): 60 ?Pulse (bpm): 84 ?Weight (lbs): 88 ?Respiratory Rate (breaths/min): 18 ?Body Mass Index (BMI): 17.2 ?Blood Pressure (mmHg): 106/72 ?Reference Range: 80 - 120 mg / dl ?Electronic Signature(s) ?Signed: 11/12/2021 3:31:18 PM By: [name] EMT ?Entered By: [name] on 11/12/2021 15:31:18 ?

## 2021-11-12 NOTE — Progress Notes (Addendum)
STANA, BAYON ([id number]) ?Visit Report for 11/12/2021 ?HBO Details ?Patient Name: Date of Service: ?[name] 11/12/2021 1:00 PM ?Medical Record Number: [id number] ?Patient Account Number: [id number] ?Date of Birth/Sex: Treating RN: ?04/29/1961 (61 y.o. 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, Jaw ?HBO Treatment Details ?Treatment Number: 5 ?Patient Type: Outpatient ?Chamber Type: Monoplace ?Chamber Serial #: [id number] ?Treatment Protocol: 2.5 ATA with 90 minutes oxygen, with two 5 minute air breaks ?Treatment Details ?Compression Rate Down: 2.0 psi / minute ?De-Compression Rate Up: 2.0 psi / minute ?A breaks and breathing ?ir ?Compress Tx Pressure periods Decompress Decompress ?Begins Reached (leave unused spaces Begins Ends ?blank) ?Chamber Pressure (ATA 1 2.5 2.5 2.5 2.5 2.5 - - 2.5 1 ?) ?Clock Time (24 hr) 12:48 12:59 13:29 13:34 14:04 14:49 - - 14:39 14:50 ?Treatment Length: 122 (minutes) ?Treatment Segments: 4 ?Vital Signs ?Capillary Blood Glucose Reference Range: 80 - 120 mg / dl ?HBO Diabetic Blood Glucose Intervention Range: <131 mg/dl or >249 mg/dl ?Time Vitals Blood Respiratory Capillary Blood Glucose Pulse Action ?Type: ?Pulse: Temperature: ?Taken: ?Pressure: ?Rate: ?Glucose (mg/dl): ?Meter #: Oximetry (%) Taken: ?Pre 12:38 106/72 84 18 97.4 ?Post 14:54 115/72 62 16 98.4 ?Treatment Response ?Treatment Toleration: Well ?Treatment Completion Status: Treatment Completed without Adverse Event ?Treatment Notes ?The patient was asked before treatment, if she had any problems with her ears or sinus today. She stated that  last night she heard popping noises in her right. I ?called Dr. Heber [place], to come look at her ears. ?Her treatment went well today, with no problems noted. ?After her treatment I asked her about her sinus and ears. She stated they were fine. ?Dr. Kearney Hard patient prior to treatment. Assessed ears. Clear tympanic membranes bilaterally. ?Physician HBO Attestation: ?I certify that I supervised this HBO treatment in accordance with Medicare ?guidelines. A trained emergency response team is readily available per Yes ?hospital policies and procedures. ?Continue HBOT as ordered. Yes ?Electronic Signature(s) ?Signed: 11/12/2021 4:27:27 PM By: [name] DO ?Previous Signature: 11/12/2021 3:38:19 PM Version By: [name] EMT ?Entered By: [name] on 11/12/2021 16:26:30 ?-------------------------------------------------------------------------------- ?HBO Safety Checklist Details ?Patient Name: ?Date of Service: ?[name] 11/12/2021 1:00 PM ?Medical Record Number: [id number] ?Patient Account Number: [id number] ?Date of Birth/Sex: ?Treating RN: ?Nov 04, 1960 (61 y.o. [name], [name] ?Primary Care Dajohn Ellender: [name] ?Other Clinician: [name] ?Referring Shanta Hartner: ?Treating Chere Babson/Extender: [name] ?[name] ?Weeks in Treatment: 1 ?HBO Safety Checklist Items ?Safety Checklist ?Consent Form Signed ?Patient voided / foley secured and emptied ?When did you last eato 1030 ?Last dose of injectable or oral agent NA ?Ostomy pouch emptied and vented if applicable ?NA ?All implantable devices assessed, documented and approved Power Port ?Intravenous access site secured and place Power Port ?Valuables secured ?Linens and cotton and cotton/polyester blend (less than 51% polyester) ?Personal oil-based products / skin lotions / body lotions removed ?Wigs or hairpieces removed ?NA ?Smoking or tobacco materials removed ?Books / newspapers / magazines / loose paper  removed ?Cologne, aftershave, perfume and deodorant removed ?Jewelry removed (may wrap wedding band) ?NA ?Make-up removed ?Hair care products removed ?Battery operated devices (external) removed ?Heating patches and chemical  warmers removed ?Titanium eyewear removed ?NA ?Nail polish cured greater than 10 hours ?NA ?Casting material cured greater than 10 hours ?NA ?Hearing aids removed ?NA ?Loose dentures or partials removed ?NA ?Prosthetics have been removed ?NA ?Patient demonstrates correct use of air break device (if applicable) ?Patient concerns have been addressed ?Patient grounding bracelet on and cord attached to chamber ?Specifics for Inpatients (complete in addition to above) ?Medication sheet sent with patient ?NA ?Intravenous medications needed or due during therapy sent with patient ?NA ?Drainage tubes (e.g. nasogastric tube or chest tube secured and vented) ?NA ?Endotracheal or Tracheotomy tube secured ?NA ?Cuff deflated of air and inflated with saline ?NA ?Airway suctioned ?NA ?Notes ?The safety checklist was done before treatment started. ?Electronic Signature(s) ?Signed: 11/12/2021 3:33:16 PM By: [name] EMT ?Entered By: [name] on 11/12/2021 15:33:16 ?

## 2021-11-12 NOTE — Progress Notes (Signed)
BRENTNEY, GOLDBACH ([id number]) ?Visit Report for 11/12/2021 ?Problem List Details ?Patient Name: Date of Service: ?[name] 11/12/2021 1:00 PM ?Medical Record Number: [id number] ?Patient Account Number: [id number] ?Date of Birth/Sex: Treating RN: ?September 03, 1960 (62 y.o. 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 11/12/2021 ?Medical Record Number: [id number] ?Patient Account Number: [id number] ?Date of Birth/Sex: Treating RN: ?1961-04-11 (61 y.o. 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, unspecified ?Facility Procedures ?CPT4 Code: 77939030 ?Description: G0277-(Facility Use Only) HBOT full body chamber, 15mn ,  ICD-10 Diagnosis Description 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 mandible ?Quantity: 1 ?Electronic Signature(s) ?Signed: 11/12/2021 3:38:56 PM By: GValeria BatmanEMT ?Signed: 11/12/2021 4:27:27 PM By: HKalman ShanDO ?Entered By: GValeria Batmanon 11/12/2021 15:38:55 ?

## 2021-11-13 ENCOUNTER — Encounter (HOSPITAL_BASED_OUTPATIENT_CLINIC_OR_DEPARTMENT_OTHER): Payer: Medicare Other | Admitting: Internal Medicine

## 2021-11-13 DIAGNOSIS — C411 Malignant neoplasm of mandible: Secondary | ICD-10-CM | POA: Diagnosis not present

## 2021-11-13 DIAGNOSIS — Z923 Personal history of irradiation: Secondary | ICD-10-CM | POA: Diagnosis not present

## 2021-11-13 DIAGNOSIS — M8788 Other osteonecrosis, other site: Secondary | ICD-10-CM

## 2021-11-13 DIAGNOSIS — M272 Inflammatory conditions of jaws: Secondary | ICD-10-CM | POA: Diagnosis not present

## 2021-11-13 NOTE — Progress Notes (Signed)
[name], [name] ([id number]) ?Visit Report for 11/13/2021 ?Problem List Details ?Patient Name: Date of Service: ?Theresa LLA Theresa Alexander D. 11/13/2021 1:00 PM ?Medical Record Number: [id number] ?Patient Account Number: [id number] ?Date of Birth/Sex: Treating RN: ?1960-12-13 (60 y.o. 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 11/13/2021 ?Medical Record Number: [id number] ?Patient Account Number: [id number] ?Date of Birth/Sex: Treating RN: ?Aug 12, 1960 (61 y.o. 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, unspecified ?Facility Procedures ?CPT4 Code: 66440347 ?Description: G0277-(Facility Use Only) HBOT full body chamber, 75mn ,  ICD-10 Diagnosis Description 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 mandible ?Quantity: 1 ?Electronic Signature(s) ?Signed: 11/13/2021 3:23:58 PM By: GValeria BatmanEMT ?Signed: 11/13/2021 5:15:00 PM By: HKalman ShanDO ?Entered By: GValeria Batmanon 11/13/2021 15:23:58 ?

## 2021-11-13 NOTE — Progress Notes (Addendum)
MATEA, STANARD ([id number]) ?Visit Report for 11/13/2021 ?HBO Details ?Patient Name: Date of Service: ?[name] 11/13/2021 1:00 PM ?Medical Record Number: [id number] ?Patient Account Number: [id number] ?Date of Birth/Sex: Treating RN: ?[birth year] (61 y.o. 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, Jaw ?HBO Treatment Details ?Treatment Number: 6 ?Patient Type: Outpatient ?Chamber Type: Monoplace ?Chamber Serial #: [id number] ?Treatment Protocol: 2.5 ATA with 90 minutes oxygen, with two 5 minute air breaks ?Treatment Details ?Compression Rate Down: 2.0 psi / minute ?De-Compression Rate Up: 2.0 psi / minute ?A breaks and breathing ?ir ?Compress Tx Pressure periods Decompress Decompress ?Begins Reached (leave unused spaces Begins Ends ?blank) ?Chamber Pressure (ATA 1 2.5 2.5 2.5 2.5 2.5 - - 2.5 1 ?) ?Clock Time (24 hr) 12:43 12:57 13:27 13:32 14:02 14:07 - - 14:37 14:48 ?Treatment Length: 125 (minutes) ?Treatment Segments: 4 ?Vital Signs ?Capillary Blood Glucose Reference Range: 80 - 120 mg / dl ?HBO Diabetic Blood Glucose Intervention Range: <131 mg/dl or >249 mg/dl ?Time Vitals Blood Respiratory Capillary Blood Glucose Pulse Action ?Type: ?Pulse: Temperature: ?Taken: ?Pressure: ?Rate: ?Glucose (mg/dl): ?Meter #: Oximetry (%) Taken: ?Pre 12:36 99/70 79 16 97.9 ?Post 14:49 105/80 67 16 97.9 ?Treatment Response ?Treatment Toleration: Well ?Treatment Completion Status: Treatment Completed without Adverse Event ?Physician HBO Attestation: ?I certify that I supervised this HBO treatment in accordance with Medicare ?guidelines. A trained  emergency response team is readily available per Yes ?hospital policies and procedures. ?Continue HBOT as ordered. Yes ?Electronic Signature(s) ?Signed: 11/13/2021 5:15:00 PM By: [name] DO ?Previous Signature: 11/13/2021 3:22:51 PM Version By: [name] EMT ?Entered By: [name] on 11/13/2021 17:14:35 ?-------------------------------------------------------------------------------- ?HBO Safety Checklist Details ?Patient Name: ?Date of Service: ?[name] 11/13/2021 1:00 PM ?Medical Record Number: [id number] ?Patient Account Number: [id number] ?Date of Birth/Sex: ?Treating RN: ?08/24/1960 (61 y.o. [name], [name] ?Primary Care Lyberti Thrush: [name] ?Other Clinician: [name] ?Referring Maleke Feria: ?Treating Devonn Giampietro/Extender: [name] ?[name] ?Weeks in Treatment: 1 ?HBO Safety Checklist Items ?Safety Checklist ?Consent Form Signed ?Patient voided / foley secured and emptied ?When did you last eato 1030 ?Last dose of injectable or oral agent NA ?Ostomy pouch emptied and vented if applicable ?NA ?All implantable devices assessed, documented and approved Power Port ?Intravenous access site secured and place Power Port ?Valuables secured ?Linens and cotton and cotton/polyester blend (less than 51% polyester) ?Personal oil-based products / skin lotions / body lotions removed ?Wigs or hairpieces removed ?NA ?Smoking or tobacco materials removed ?Books / newspapers / magazines / loose paper removed ?Cologne, aftershave, perfume and deodorant removed ?Jewelry removed (may wrap wedding band) ?NA ?Make-up removed ?Hair care products removed ?Battery operated devices (external) removed ?Heating patches and chemical warmers removed ?Titanium eyewear removed ?NA ?Nail polish cured greater than 10 hours 11/02/2021 ?Casting material cured greater than 10 hours ?NA ?Hearing aids removed ?NA ?Loose dentures or partials removed ?NA ?Prosthetics have been  removed ?NA ?Patient demonstrates correct use of air break device (if applicable) ?Patient concerns have been addressed ?Patient grounding bracelet on and cord attached to chamber ?Specifics for Inpatients (complete in addition to above) ?Medication sheet sent with patient ?NA ?Intravenous medications needed  or due during therapy sent with patient ?NA ?Drainage tubes (e.g. nasogastric tube or chest tube secured and vented) ?NA ?Endotracheal or Tracheotomy tube secured ?NA ?Cuff deflated of air and inflated with saline ?NA ?Airway suctioned ?NA ?Notes ?The safety checklist was done before treatment started. ?Electronic Signature(s) ?Signed: 11/13/2021 3:20:21 PM By: [name] EMT ?Entered By: [name] on 11/13/2021 15:20:21 ?

## 2021-11-13 NOTE — Progress Notes (Signed)
[name] ([id number]) ?Visit Report for 11/13/2021 ?Arrival Information Details ?Patient Name: Date of Service: ?Theresa LLA Theresa Alexander D. 11/13/2021 1:00 PM ?Medical Record Number: [id number] ?Patient Account Number: [id number] ?Date of Birth/Sex: Treating RN: ?01-Mar-1961 (61 y.o. [name], [name] ?Primary Care [name]: [name] ?Other Clinician: [name] ?Referring Dheeraj Hail: ?Treating [name] ?[name] ?Weeks in Treatment: 1 ?Visit Information History Since Last Visit ?All ordered tests and consults were completed: Yes ?Patient Arrived: Ambulatory ?Added or deleted any medications: No ?Arrival Time: 12:26 ?Any new allergies or adverse reactions: No ?Accompanied By: None ?Had a fall or experienced change in No ?Transfer Assistance: None ?activities of daily living that may affect ?Patient Identification Verified: Yes ?risk of falls: ?Secondary Verification Process Completed: Yes ?Signs or symptoms of abuse/neglect since last visito No ?Patient Requires Transmission-Based Precautions: No ?Hospitalized since last visit: No ?Patient Has Alerts: No ?Implantable device outside of the clinic excluding No ?cellular tissue based products placed in the center ?since last visit: ?Pain Present Now: No ?Electronic Signature(s) ?Signed: 11/13/2021 3:17:43 PM By: [name] EMT ?Entered By: [name] on 11/13/2021 15:17:43 ?-------------------------------------------------------------------------------- ?Encounter Discharge Information Details ?Patient Name: Date of Service: ?Theresa LLA Theresa Alexander D. 11/13/2021 1:00 PM ?Medical Record Number: [id number] ?Patient Account Number: [id number] ?Date of Birth/Sex: Treating RN: ?1960/12/25 (61 y.o. [name], [name] ?Primary Care [name]: [name] ?Other Clinician: [name] ?Referring Kippy Gohman: ?Treating Trinity Haun/Extender: Kalman Dillon ?[name] ?Weeks in Treatment: 1 ?Encounter Discharge  Information Items ?Discharge Condition: Stable ?Ambulatory Status: Ambulatory ?Discharge Destination: Home ?Transportation: Private Auto ?Accompanied By: None ?Schedule Follow-up Appointment: Yes ?Clinical Summary of Care: ?Electronic Signature(s) ?Signed: 11/13/2021 3:24:40 PM By: [name] EMT ?Entered By: [name] on 11/13/2021 15:24:40 ?-------------------------------------------------------------------------------- ?Vitals Details ?Patient Name: ?Date of Service: ?Theresa [name] 11/13/2021 1:00 PM ?Medical Record Number: [id number] ?Patient Account Number: [id number] ?Date of Birth/Sex: ?Treating RN: ?04-01-1961 (61 y.o. [name], [name] ?Primary Care [name]: [name] ?Other Clinician: [name] ?Referring Kelden Lavallee: ?Treating Bryahna Lesko/Extender: Kalman Dillon ?[name] ?Weeks in Treatment: 1 ?Vital Signs ?Time Taken: 12:36 ?Temperature (??F): 97.9 ?Height (in): 60 ?Pulse (bpm): 79 ?Weight (lbs): 88 ?Respiratory Rate (breaths/min): 16 ?Body Mass Index (BMI): 17.2 ?Blood Pressure (mmHg): 99/70 ?Reference Range: 80 - 120 mg / dl ?Electronic Signature(s) ?Signed: 11/13/2021 3:18:20 PM By: [name] EMT ?Entered By: [name] on 11/13/2021 15:18:19 ?

## 2021-11-14 ENCOUNTER — Encounter (HOSPITAL_BASED_OUTPATIENT_CLINIC_OR_DEPARTMENT_OTHER): Payer: Medicare Other | Admitting: General Surgery

## 2021-11-14 DIAGNOSIS — M272 Inflammatory conditions of jaws: Secondary | ICD-10-CM | POA: Diagnosis not present

## 2021-11-14 NOTE — Progress Notes (Addendum)
[name], [name] ([id number]) ?Visit Report for 11/14/2021 ?HBO Details ?Patient Name: Date of Service: ?Theresa LLA Theresa Alexander D. 11/14/2021 1:00 PM ?Medical Record Number: [id number] ?Patient Account Number: [id number] ?Date of Birth/Sex: Treating RN: ?1961/02/01 (61 y.o. 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, Jaw ?HBO Treatment Details ?Treatment Number: 7 ?Patient Type: Outpatient ?Chamber Type: Monoplace ?Chamber Serial #: [id number] ?Treatment Protocol: 2.5 ATA with 90 minutes oxygen, with two 5 minute air breaks ?Treatment Details ?Compression Rate Down: 2.0 psi / minute ?De-Compression Rate Up: 2.0 psi / minute ?A breaks and breathing ?ir ?Compress Tx Pressure periods Decompress Decompress ?Begins Reached (leave unused spaces Begins Ends ?blank) ?Chamber Pressure (ATA 1 2.5 2.5 2.5 2.5 2.5 - - 2.5 1 ?) ?Clock Time (24 hr) 12:40 12:54 13:24 13:29 13:59 14:04 - - 14:34 14:45 ?Treatment Length: 125 (minutes) ?Treatment Segments: 4 ?Vital Signs ?Capillary Blood Glucose Reference Range: 80 - 120 mg / dl ?HBO Diabetic Blood Glucose Intervention Range: <131 mg/dl or >249 mg/dl ?Time Vitals Blood Respiratory Capillary Blood Glucose Pulse Action ?Type: ?Pulse: Temperature: ?Taken: ?Pressure: ?Rate: ?Glucose (mg/dl): ?Meter #: Oximetry (%) Taken: ?Pre 12:35 101/70 77 16 98.1 ?Post 14:47 104/84 59 14 98.1 ?Treatment Response ?Treatment Toleration: Well ?Treatment Completion Status: Treatment Completed without Adverse Event ?Treatment Notes ?Treatment went well today, no problems noted.Marland Kitchen ?Physician HBO Attestation: ?I certify that I supervised this HBO  treatment in accordance with Medicare ?guidelines. A trained emergency response team is readily available per Yes ?hospital policies and procedures. ?Continue HBOT as ordered. Yes ?Electronic Signature(s) ?Signed: 11/14/2021 5:25:43 PM By: [name] MD FACS ?Previous Signature: 11/14/2021 3:44:39 PM Version By: [name] EMT ?Entered By: [name] on 11/14/2021 17:25:43 ?-------------------------------------------------------------------------------- ?HBO Safety Checklist Details ?Patient Name: ?Date of Service: ?Theresa [name] 11/14/2021 1:00 PM ?Medical Record Number: [id number] ?Patient Account Number: [id number] ?Date of Birth/Sex: ?Treating RN: ?05/22/1961 (61 y.o. F) Deaton, Bobbi ?Primary Care Scarlettrose Costilow: [name] ?Other Clinician: [name] ?Referring Florella Mcneese: ?Treating Miski Feldpausch/Extender: [name] [name] ?Weeks in Treatment: 1 ?HBO Safety Checklist Items ?Safety Checklist ?Consent Form Signed ?Patient voided / foley secured and emptied ?When did you last eato 1030 ?Last dose of injectable or oral agent NA ?Ostomy pouch emptied and vented if applicable ?NA ?All implantable devices assessed, documented and approved Power Port ?Intravenous access site secured and place Power Port ?Valuables secured ?Linens and cotton and cotton/polyester blend (less than 51% polyester) ?Personal oil-based products / skin lotions / body lotions removed ?Wigs or hairpieces removed ?NA ?Smoking or tobacco materials removed ?Books / newspapers / magazines / loose paper removed ?Cologne, aftershave, perfume and deodorant removed ?Jewelry removed (may wrap wedding band) ?NA ?Make-up removed ?NA ?Hair care products removed ?Battery operated devices (external) removed ?Heating patches and chemical warmers removed ?Titanium eyewear removed ?NA ?Nail polish cured greater than 10 hours 11/09/2021 ?Casting material cured greater than 10 hours ?NA ?Hearing aids removed ?NA ?Loose  dentures or partials removed ?NA ?Prosthetics have been removed ?NA ?Patient demonstrates correct use of air break device (if applicable) ?Patient concerns have been addressed ?Patient grounding bracelet on and cord attached to chamber ?Specifics for Inpatients (complete in addition  to above) ?Medication sheet sent with patient ?NA ?Intravenous medications needed or due during therapy sent with patient ?NA ?Drainage tubes (e.g. nasogastric tube or chest tube secured and vented) ?NA ?Endotracheal or Tracheotomy tube secured ?NA ?Cuff deflated of air and inflated with saline ?NA ?Airway suctioned ?NA ?Notes ?The safety checklist was done before treatment started. ?Electronic Signature(s) ?Signed: 11/14/2021 3:42:23 PM By: [name] EMT ?Entered By: [name] on 11/14/2021 15:42:23 ?

## 2021-11-14 NOTE — Progress Notes (Signed)
FELIPE, CABELL ([id number]) ?Visit Report for 11/14/2021 ?Problem List Details ?Patient Name: Date of Service: ?Theresa LLA Theresa Alexander D. 11/14/2021 1:00 PM ?Medical Record Number: [id number] ?Patient Account Number: [id number] ?Date of Birth/Sex: Treating RN: ?20-Dec-1960 (61 y.o. 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 11/14/2021 ?Medical Record Number: [id number] ?Patient Account Number: [id number] ?Date of Birth/Sex: Treating RN: ?1961-04-19 (61 y.o. 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, unspecified ?Facility Procedures ?CPT4 Code: 37169678 ?Description: G0277-(Facility Use Only) HBOT full body chamber, 25mn  , ICD-10 Diagnosis Description 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 mandible ?Quantity: 1 ?Electronic Signature(s) ?Signed: 11/14/2021 3:45:11 PM By: GValeria BatmanEMT ?Signed: 11/14/2021 5:26:27 PM By: CFredirick MaudlinMD FACS ?Entered By: GValeria Batmanon 11/14/2021 15:45:11 ?

## 2021-11-14 NOTE — Progress Notes (Signed)
CHASITEE, ZENKER ([id number]) ?Visit Report for 11/14/2021 ?Arrival Information Details ?Patient Name: Date of Service: ?Theresa LLA [name] D. 11/14/2021 1:00 PM ?Medical Record Number: [id number] ?Patient Account Number: [id number] ?Date of Birth/Sex: Treating RN: ?07/06/1960 (61 y.o. F) Deaton, Bobbi ?Primary Care [name]: [name], [name] ?Other Clinician: [name] ?Referring Baylee Mccorkel: ?Treating Hollie Bartus/Extender: [name] ?[name], [name] ?Weeks in Treatment: 1 ?Visit Information History Since Last Visit ?All ordered tests and consults were completed: Yes ?Patient Arrived: Ambulatory ?Added or deleted any medications: No ?Arrival Time: 12:29 ?Any new allergies or adverse reactions: No ?Accompanied By: None ?Had a fall or experienced change in No ?Transfer Assistance: None ?activities of daily living that may affect ?Patient Identification Verified: Yes ?risk of falls: ?Secondary Verification Process Completed: Yes ?Signs or symptoms of abuse/neglect since last visito No ?Patient Requires Transmission-Based Precautions: No ?Hospitalized since last visit: No ?Patient Has Alerts: No ?Implantable device outside of the clinic excluding No ?cellular tissue based products placed in the center ?since last visit: ?Pain Present Now: No ?Electronic Signature(s) ?Signed: 11/14/2021 3:39:50 PM By: [name] EMT ?Entered By: [name] on 11/14/2021 15:39:49 ?-------------------------------------------------------------------------------- ?Encounter Discharge Information Details ?Patient Name: Date of Service: ?Theresa LLA [name] D. 11/14/2021 1:00 PM ?Medical Record Number: [id number] ?Patient Account Number: [id number] ?Date of Birth/Sex: Treating RN: ?09-15-1960 (61 y.o. F) Deaton, Bobbi ?Primary Care Lem Peary: [name], [name] ?Other Clinician: [name] ?Referring Velinda Wrobel: ?Treating Jaylen Claude/Extender: [name] ?[name], [name] ?Weeks in Treatment: 1 ?Encounter Discharge  Information Items ?Discharge Condition: Stable ?Ambulatory Status: Ambulatory ?Discharge Destination: Home ?Transportation: Private Auto ?Accompanied By: None ?Schedule Follow-up Appointment: Yes ?Clinical Summary of Care: ?Electronic Signature(s) ?Signed: 11/14/2021 3:45:49 PM By: [name] EMT ?Entered By: [name] on 11/14/2021 15:45:49 ?-------------------------------------------------------------------------------- ?Vitals Details ?Patient Name: ?Date of Service: ?Theresa LLA [name] 11/14/2021 1:00 PM ?Medical Record Number: [id number] ?Patient Account Number: [id number] ?Date of Birth/Sex: ?Treating RN: ?01-07-1961 (61 y.o. F) Deaton, Bobbi ?Primary Care Alynna Hargrove: [name], [name] ?Other Clinician: [name] ?Referring [place]: ?Treating Ein Rijo/Extender: [name] ?[name], [name] ?Weeks in Treatment: 1 ?Vital Signs ?Time Taken: 12:35 ?Temperature (??F): 98.1 ?Height (in): 60 ?Pulse (bpm): 77 ?Weight (lbs): 88 ?Respiratory Rate (breaths/min): 16 ?Body Mass Index (BMI): 17.2 ?Blood Pressure (mmHg): 101/70 ?Reference Range: 80 - 120 mg / dl ?Electronic Signature(s) ?Signed: 11/14/2021 3:40:19 PM By: [name] EMT ?Entered By: [name] on 11/14/2021 15:40:19 ?

## 2021-11-15 ENCOUNTER — Encounter (HOSPITAL_BASED_OUTPATIENT_CLINIC_OR_DEPARTMENT_OTHER): Payer: Medicare Other | Admitting: General Surgery

## 2021-11-15 DIAGNOSIS — M272 Inflammatory conditions of jaws: Secondary | ICD-10-CM | POA: Diagnosis not present

## 2021-11-15 NOTE — Progress Notes (Signed)
ELNORE, COSENS ([id number]) Visit Report for 11/15/2021 Problem List Details Patient Name: Date of Service: Theresa LLA [name] 11/15/2021 1:00 PM Medical Record Number: [id number] Patient Account Number: [id number] Date of Birth/Sex: Treating RN: 1961-05-25 (61 y.o. 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 11/15/2021 Medical Record Number: [id number] Patient Account Number: [id number] Date of Birth/Sex: Treating RN: 1961-03-23 (61 y.o. 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

## 2021-11-15 NOTE — Progress Notes (Addendum)
JULY, LINAM ([id number]) Visit Report for 11/15/2021 HBO Details Patient Name: Date of Service: [name] 11/15/2021 1:00 PM Medical Record Number: [id number] Patient Account Number: [id number] Date of Birth/Sex: Treating RN: [birth year] (61 y.o. 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, Jaw HBO Treatment Details Treatment Number: 8 Patient Type: Outpatient Chamber Type: Monoplace Chamber Serial #: [id number] Treatment Protocol: 2.5 ATA with 90 minutes oxygen, with two 5 minute air breaks Treatment Details Compression Rate Down: 2.0 psi / minute De-Compression Rate Up: 2.0 psi / minute A breaks and breathing ir Compress Tx Pressure periods Decompress Decompress Begins Reached (leave unused spaces Begins Ends blank) Chamber Pressure (ATA 1 2.5 2.5 2.5 2.5 2.5 - - 2.5 1 ) Clock Time (24 hr) 12:43 12:56 13:26 13:31 14:01 14:36 - - 14:36 14:47 Treatment Length: 124 (minutes) Treatment Segments: 4 Vital Signs Capillary Blood Glucose Reference Range: 80 - 120 mg / dl HBO Diabetic Blood Glucose Intervention Range: <131 mg/dl or >249 mg/dl Time Vitals Blood Respiratory Capillary Blood Glucose Pulse Action Type: Pulse: Temperature: Taken: Pressure: Rate: Glucose (mg/dl): Meter #: Oximetry (%) Taken: Pre 12:36 90/73 76 16 98.3 Post 14:49 109/70 62 14 98.3 Treatment Response Treatment Toleration: Well Treatment Completion Status: Treatment Completed without Adverse Event Treatment Notes No problems noted today during treatment. Physician HBO Attestation: I certify that I supervised this HBO  treatment in accordance with Medicare guidelines. A trained emergency response team is readily available per Yes hospital policies and procedures. Continue HBOT as ordered. Yes Electronic Signature(s) Signed: 11/15/2021 4:11:02 PM By: [name] MD FACS Previous Signature: 11/15/2021 2:59:20 PM Version By: [name] EMT Previous Signature: 11/15/2021 2:00:55 PM Version By: [name] EMT Entered By: [name] on 11/15/2021 16:11:02 -------------------------------------------------------------------------------- HBO Safety Checklist Details Patient Name: Date of Service: [name] 11/15/2021 1:00 PM Medical Record Number: [id number] Patient Account Number: [id number] Date of Birth/Sex: Treating RN: 02-03-1961 (61 y.o. [name] Primary Care Jetta Murray: UXNAT, MA RIA [name] Other Clinician: [name] Referring Shaasia Odle: Treating Sybrina Laning/Extender: [name] [place] [name] Weeks in Treatment: 1 HBO Safety Checklist Items Safety Checklist Consent Form Signed Patient voided / foley secured and emptied When did you last eato 1030 Last dose of injectable or oral agent NA Ostomy pouch emptied and vented if applicable NA All implantable devices assessed, documented and approved Power Port Intravenous access site secured and place Power [employer] blend (less than 51% polyester) Personal oil-based products / skin lotions / body lotions removed Wigs or hairpieces removed NA Smoking or tobacco materials removed Books / newspapers / magazines / loose paper removed Cologne, aftershave, perfume and deodorant removed Jewelry removed (may wrap wedding band) NA Make-up removed Hair care products removed Battery operated devices (external) removed Heating patches and chemical warmers removed Titanium eyewear removed NA Nail polish cured greater than 10 hours 11/09/2021 Casting material  cured greater than 10 hours NA Hearing aids removed NA Loose dentures or partials removed NA Prosthetics have been removed NA Patient demonstrates correct use of air break device (if applicable) Patient concerns have been addressed Patient grounding bracelet on and cord attached  to chamber Specifics for Inpatients (complete in addition to above) Medication sheet sent with patient NA Intravenous medications needed or due during therapy sent with patient NA Drainage tubes (e.g. nasogastric tube or chest tube secured and vented) NA Endotracheal or Tracheotomy tube secured NA Cuff deflated of air and inflated with saline NA Airway suctioned NA Notes The safety checklist was done before treatment started. Electronic Signature(s) Signed: 11/15/2021 2:00:09 PM By: [name] EMT Entered By: [name] on 11/15/2021 14:00:08

## 2021-11-15 NOTE — Progress Notes (Addendum)
JESSE, HIRST ([id number]) Visit Report for 11/15/2021 Arrival Information Details Patient Name: Date of Service: [name] 11/15/2021 1:00 PM Medical Record Number: [id number] Patient Account Number: [id number] Date of Birth/Sex: Treating RN: [birth year] (61 y.o. [name] Primary Care Luna Audia: HYWVP, MA RIA [name] Other Clinician: [name] Referring Gor Vestal: Treating Keenen Roessner/Extender: [name] [place], [place] [name] Weeks in Treatment: 1 Visit Information History Since Last Visit All ordered tests and consults were completed: Yes Patient Arrived: Ambulatory Added or deleted any medications: No Arrival Time: 12:29 Any new allergies or adverse reactions: No Accompanied By: None Had a fall or experienced change in No Transfer Assistance: None activities of daily living that may affect Patient Identification Verified: Yes risk of falls: Secondary Verification Process Completed: Yes Signs or symptoms of abuse/neglect since last visito No Patient Requires Transmission-Based Precautions: No Hospitalized since last visit: No Patient Has Alerts: No Implantable device outside of the clinic excluding No cellular tissue based products placed in the center since last visit: Pain Present Now: No Electronic Signature(s) Signed: 11/15/2021 1:57:29 PM By: [name] EMT Entered By: [name] on 11/15/2021 13:57:29 -------------------------------------------------------------------------------- Encounter Discharge Information Details Patient Name: Date of Service: [name] 11/15/2021 1:00 PM Medical Record Number: [id number] Patient Account Number: [id number] Date of Birth/Sex: Treating RN: December 06, 1960 (61 y.o. [name] Primary Care Dreon Pineda: NIOEV, MA RIA [name] Other Clinician: [name] Referring Rylen Swindler: Treating Yailene Badia/Extender: [name] [place], [place] RIA Quay Burow in Treatment: 1 Encounter Discharge  Information Items Discharge Condition: Stable Ambulatory Status: Ambulatory Discharge Destination: Home Transportation: Private Auto Accompanied By: None Schedule Follow-up Appointment: Yes Clinical Summary of Care: Electronic Signature(s) Signed: 11/15/2021 3:00:35 PM By: [name] EMT Entered By: [name] on 11/15/2021 15:00:35 -------------------------------------------------------------------------------- Vitals Details Patient Name: Date of Service: [name] 11/15/2021 1:00 PM Medical Record Number: [id number] Patient Account Number: [id number] Date of Birth/Sex: Treating RN: 09/28/1960 (61 y.o. [name] Primary Care Destyn Schuyler: WEXHB, MA RIA [name] Other Clinician: [name] Referring Ronald Vinsant: Treating Scarleth Brame/Extender: [name] [place] [name] Weeks in Treatment: 1 Vital Signs Time Taken: 12:36 Temperature (F): 98.3 Height (in): 60 Pulse (bpm): 76 Weight (lbs): 88 Respiratory Rate (breaths/min): 16 Body Mass Index (BMI): 17.2 Blood Pressure (mmHg): 90/73 Reference Range: 80 - 120 mg / dl Electronic Signature(s) Signed: 11/15/2021 1:58:07 PM By: [name] EMT Entered By: [name] on 11/15/2021 13:58:07

## 2021-11-16 ENCOUNTER — Encounter (HOSPITAL_BASED_OUTPATIENT_CLINIC_OR_DEPARTMENT_OTHER): Payer: Medicare Other | Admitting: General Surgery

## 2021-11-16 DIAGNOSIS — M272 Inflammatory conditions of jaws: Secondary | ICD-10-CM | POA: Diagnosis not present

## 2021-11-16 NOTE — Progress Notes (Signed)
[name], [name] ([id number]) Visit Report for 11/16/2021 Problem List Details Patient Name: Date of Service: [name] 11/16/2021 10:00 A M Medical Record Number: [id number] Patient Account Number: [id number] Date of Birth/Sex: Treating RN: [birth year] (61 y.o. 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 11/16/2021 Medical Record Number: [id number] Patient Account Number: [id number] Date of Birth/Sex: Treating RN: 09-Sep-1960 (61 y.o. 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, unspecified Facility Procedures CPT4 Code: 66063016 Description: G0277-(Facility Use Only) HBOT full body  chamber, 67mn , ICD-10 Diagnosis Description 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

## 2021-11-16 NOTE — Progress Notes (Addendum)
JONEISHA, MILES ([id number]) Visit Report for 11/16/2021 Arrival Information Details Patient Name: Date of Service: [name] 11/16/2021 10:00 A M Medical Record Number: [id number] Patient Account Number: [id number] Date of Birth/Sex: Treating RN: 14-Sep-1960 (61 y.o. [name] Primary Care Peytyn Trine: [place], [place] RIA [name] Other Clinician: Donavan Burnet Referring Byan Poplaski: Treating Adaiah Jaskot/Extender: [name] [place], [place] [name] Weeks in Treatment: 1 Visit Information History Since Last Visit All ordered tests and consults were completed: Yes Patient Arrived: Ambulatory Added or deleted any medications: No Arrival Time: 09:50 Any new allergies or adverse reactions: No Accompanied By: self Had a fall or experienced change in No Transfer Assistance: None activities of daily living that may affect Patient Identification Verified: Yes risk of falls: Secondary Verification Process Completed: Yes Signs or symptoms of abuse/neglect since last visito No Patient Requires Transmission-Based Precautions: No Hospitalized since last visit: No Patient Has Alerts: No Implantable device outside of the clinic excluding No cellular tissue based products placed in the center since last visit: Pain Present Now: No Electronic Signature(s) Signed: 11/16/2021 12:15:18 PM By: Donavan Burnet CHT EMT BS , , Entered By: Donavan Burnet on 11/16/2021 12:02:59 -------------------------------------------------------------------------------- Encounter Discharge Information Details Patient Name: Date of Service: [name] 11/16/2021 10:00 A M Medical Record Number: [id number] Patient Account Number: [id number] Date of Birth/Sex: Treating RN: October 25, 1960 (61 y.o. [name] Primary Care Mikailah Morel: YQMVH, MA RIA [name] Other Clinician: Donavan Burnet Referring Vala Raffo: Treating Delilah Mulgrew/Extender: [name] [place] RIA Quay Burow in Treatment:  1 Encounter Discharge Information Items Discharge Condition: Stable Ambulatory Status: Ambulatory Discharge Destination: Home Transportation: Private Auto Accompanied By: None Schedule Follow-up Appointment: Yes Clinical Summary of Care: Electronic Signature(s) Signed: 11/16/2021 12:41:40 PM By: [name] EMT Entered By: [name] on 11/16/2021 12:41:40 -------------------------------------------------------------------------------- Vitals Details Patient Name: Date of Service: [name] 11/16/2021 10:00 A M Medical Record Number: [id number] Patient Account Number: [id number] Date of Birth/Sex: Treating RN: 10-Aug-1960 (61 y.o. [name] Primary Care Elyssia Strausser: [place], [place] RIA [name] Other Clinician: Donavan Burnet Referring Courtni Balash: Treating Laveta Gilkey/Extender: [name] [place], [place] [name] Weeks in Treatment: 1 Vital Signs Time Taken: 10:04 Temperature (F): 98 Height (in): 60 Pulse (bpm): 72 Weight (lbs): 88 Respiratory Rate (breaths/min): 16 Body Mass Index (BMI): 17.2 Blood Pressure (mmHg): 100/68 Reference Range: 80 - 120 mg / dl Notes Manual Electronic Signature(s) Signed: 11/16/2021 12:15:18 PM By: Donavan Burnet CHT EMT BS , , Entered By: Donavan Burnet on 11/16/2021 12:02:48

## 2021-11-19 ENCOUNTER — Encounter (HOSPITAL_BASED_OUTPATIENT_CLINIC_OR_DEPARTMENT_OTHER): Payer: Medicare Other | Admitting: Internal Medicine

## 2021-11-19 DIAGNOSIS — M8788 Other osteonecrosis, other site: Secondary | ICD-10-CM

## 2021-11-19 DIAGNOSIS — M272 Inflammatory conditions of jaws: Secondary | ICD-10-CM | POA: Diagnosis not present

## 2021-11-19 DIAGNOSIS — C411 Malignant neoplasm of mandible: Secondary | ICD-10-CM | POA: Diagnosis not present

## 2021-11-19 DIAGNOSIS — Z923 Personal history of irradiation: Secondary | ICD-10-CM

## 2021-11-19 NOTE — Progress Notes (Signed)
Theresa, Dillon ([id number]) Visit Report for 11/19/2021 HBO Safety Checklist Details Patient Name: Date of Service: [name] 11/19/2021 1:00 PM Medical Record Number: [id number] Patient Account Number: [id number] Date of Birth/Sex: Treating RN: September 16, 1960 (61 y.o. [name] Primary Care Gustie Bobb: [place], [place] RIA [name] Other Clinician: [name] Referring Kierah Goatley: Treating Tomasina Keasling/Extender: [name] [place] [name] Weeks in Treatment: 2 HBO Safety Checklist Items Safety Checklist Consent Form Signed Patient voided / foley secured and emptied When did you last eato 1130 Last dose of injectable or oral agent NA Ostomy pouch emptied and vented if applicable NA All implantable devices assessed, documented and approved NA Intravenous access site secured and place NA Valuables secured Linens and cotton and cotton/polyester blend (less than 51% polyester) Personal oil-based products / skin lotions / body lotions removed Wigs or hairpieces removed NA Smoking or tobacco materials removed Books / newspapers / magazines / loose paper removed Cologne, aftershave, perfume and deodorant removed Jewelry removed (may wrap wedding band) NA Make-up removed Hair care products removed Battery operated devices (external) removed Heating patches and chemical warmers removed Titanium eyewear removed NA Nail polish cured greater than 10 hours 11/17/2021 Casting material cured greater than 10 hours NA Hearing aids removed NA Loose dentures or partials removed NA Prosthetics have been removed NA Patient demonstrates correct use of air break device (if applicable) Patient concerns have been addressed Patient grounding bracelet on and cord attached to chamber Specifics for Inpatients (complete in addition to above) Medication sheet sent with patient NA Intravenous medications needed or due during therapy sent with patient NA Drainage tubes (e.g.  nasogastric tube or chest tube secured and vented) NA Endotracheal or Tracheotomy tube secured NA Cuff deflated of air and inflated with saline NA Airway suctioned NA Notes The safety checklist was done before treatment was started. Electronic Signature(s) Signed: 11/19/2021 3:36:02 PM By: [name] EMT Entered By: [name] on 11/19/2021 15:36:02

## 2021-11-19 NOTE — Progress Notes (Addendum)
[name], [name] ([id number]) Visit Report for 11/19/2021 Arrival Information Details Patient Name: Date of Service: [name] 11/19/2021 1:00 PM Medical Record Number: [id number] Patient Account Number: [id number] Date of Birth/Sex: Treating RN: 13-Apr-1961 (61 y.o. [name] Primary Care [name]: ALPFX, [place] RIA [name] Other Clinician: Valeria Dillon Referring [name]: Treating [name]/Extender: [name] [place] [name] Weeks in Treatment: 2 Visit Information History Since Last Visit All ordered tests and consults were completed: Yes Patient Arrived: Ambulatory Added or deleted any medications: No Arrival Time: 15:27 Any new allergies or adverse reactions: No Accompanied By: None Had a fall or experienced change in No Transfer Assistance: None activities of daily living that may affect Patient Identification Verified: Yes risk of falls: Secondary Verification Process Completed: Yes Signs or symptoms of abuse/neglect since last visito No Patient Requires Transmission-Based Precautions: No Hospitalized since last visit: No Patient Has Alerts: No Implantable device outside of the clinic excluding No cellular tissue based products placed in the center since last visit: Pain Present Now: No Electronic Signature(s) Signed: 11/19/2021 3:28:26 PM By: [name] EMT Entered By: [name] on 11/19/2021 15:28:26 -------------------------------------------------------------------------------- Encounter Discharge Information Details Patient Name: Date of Service: [name] 11/19/2021 1:00 PM Medical Record Number: [id number] Patient Account Number: [id number] Date of Birth/Sex: Treating RN: 10-28-1960 (61 y.o. [name] Primary Care [name]: HGDJM, [place] RIA [name] Other Clinician: Valeria Dillon Referring Anhthu Dillon: Treating [name]/Extender: [name] [place] [name] Weeks in Treatment: 2 Encounter Discharge  Information Items Discharge Condition: Stable Ambulatory Status: Ambulatory Discharge Destination: Home Transportation: Private Auto Accompanied By: None Schedule Follow-up Appointment: Yes Clinical Summary of Care: Electronic Signature(s) Signed: 11/19/2021 3:39:24 PM By: [name] EMT Entered By: [name] on 11/19/2021 15:39:24 -------------------------------------------------------------------------------- Vitals Details Patient Name: Date of Service: [name] 11/19/2021 1:00 PM Medical Record Number: [id number] Patient Account Number: [id number] Date of Birth/Sex: Treating RN: 06-11-1961 (61 y.o. [name] Primary Care [name]: QIWLN, [place] RIA [name] Other Clinician: Valeria Dillon Referring [name]: Treating [name]/Extender: [name] [place], [place] [name] Weeks in Treatment: 2 Vital Signs Time Taken: 12:42 Temperature (F): 99.1 Height (in): 60 Pulse (bpm): 72 Weight (lbs): 88 Respiratory Rate (breaths/min): 14 Body Mass Index (BMI): 17.2 Blood Pressure (mmHg): 101/88 Reference Range: 80 - 120 mg / dl Electronic Signature(s) Signed: 11/19/2021 3:29:03 PM By: [name] EMT Entered By: [name] on 11/19/2021 15:29:03

## 2021-11-20 ENCOUNTER — Encounter (HOSPITAL_BASED_OUTPATIENT_CLINIC_OR_DEPARTMENT_OTHER): Payer: Medicare Other | Admitting: Internal Medicine

## 2021-11-20 NOTE — Progress Notes (Signed)
[name], [name] ([id number]) Visit Report for 11/19/2021 Problem List Details Patient Name: Date of Service: [name] 11/19/2021 1:00 PM Medical Record Number: [id number] Patient Account Number: [id number] Date of Birth/Sex: Treating RN: 17-Mar-1961 (61 y.o. 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 11/19/2021 Medical Record Number: [id number] Patient Account Number: [id number] Date of Birth/Sex: Treating RN: February 12, 1961 (61 y.o. 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

## 2021-11-21 ENCOUNTER — Encounter (HOSPITAL_BASED_OUTPATIENT_CLINIC_OR_DEPARTMENT_OTHER): Payer: Medicare Other | Admitting: General Surgery

## 2021-11-21 DIAGNOSIS — M272 Inflammatory conditions of jaws: Secondary | ICD-10-CM | POA: Diagnosis not present

## 2021-11-21 NOTE — Progress Notes (Signed)
[name], [name] ([id number]) Visit Report for 11/21/2021 Arrival Information Details Patient Name: Date of Service: [name] 11/21/2021 1:00 PM Medical Record Number: [id number] Patient Account Number: [id number] Date of Birth/Sex: Treating RN: Nov 18, 1960 (61 y.o. [name] Primary Care Bryor Rami: CVELF, [place] RIA [name] Other Clinician: [name] Referring Roverto Bodmer: Treating Cami Delawder/Extender: [name] [place] [name] Weeks in Treatment: 2 Visit Information History Since Last Visit All ordered tests and consults were completed: Yes Patient Arrived: Ambulatory Added or deleted any medications: No Arrival Time: 12:38 Any new allergies or adverse reactions: No Accompanied By: None Had a fall or experienced change in No Transfer Assistance: None activities of daily living that may affect Patient Identification Verified: Yes risk of falls: Secondary Verification Process Completed: Yes Signs or symptoms of abuse/neglect since last visito No Patient Requires Transmission-Based Precautions: No Hospitalized since last visit: No Patient Has Alerts: No Implantable device outside of the clinic excluding No cellular tissue based products placed in the center since last visit: Pain Present Now: No Electronic Signature(s) Signed: 11/21/2021 5:29:40 PM By: [name] EMT Entered By: [name] on 11/21/2021 17:29:40 -------------------------------------------------------------------------------- Encounter Discharge Information Details Patient Name: Date of Service: [name] 11/21/2021 1:00 PM Medical Record Number: [id number] Patient Account Number: [id number] Date of Birth/Sex: Treating RN: 03-28-1961 (61 y.o. [name] Primary Care Analeya Luallen: HENID, [place] RIA [name] Other Clinician: [name] Referring Alexzandra Bilton: Treating Ishani Goldwasser/Extender: [name] [place] RIA Quay Burow in Treatment: 2 Encounter Discharge  Information Items Discharge Condition: Stable Ambulatory Status: Ambulatory Discharge Destination: Home Transportation: Private Auto Accompanied By: None Schedule Follow-up Appointment: Yes Clinical Summary of Care: Electronic Signature(s) Signed: 11/21/2021 5:39:00 PM By: [name] EMT Entered By: [name] on 11/21/2021 17:39:00 -------------------------------------------------------------------------------- Vitals Details Patient Name: Date of Service: [name] 11/21/2021 1:00 PM Medical Record Number: [id number] Patient Account Number: [id number] Date of Birth/Sex: Treating RN: [birth year] (61 y.o. [name] Primary Care Quinnley Colasurdo: RWERX, [place] RIA [name] Other Clinician: [name] Referring Malashia Kamaka: Treating Raza Bayless/Extender: [name] [place] [name] Weeks in Treatment: 2 Vital Signs Time Taken: 12:50 Temperature (F): 99.1 Height (in): 60 Pulse (bpm): 90 Weight (lbs): 88 Respiratory Rate (breaths/min): 16 Body Mass Index (BMI): 17.2 Blood Pressure (mmHg): 110/83 Capillary Blood Glucose (mg/dl): 97 Reference Range: 80 - 120 mg / dl Electronic Signature(s) Signed: 11/21/2021 5:30:11 PM By: [name] EMT Entered By: [name] on 11/21/2021 17:30:11

## 2021-11-21 NOTE — Progress Notes (Signed)
[name], [name] ([id number]) Visit Report for 11/21/2021 HBO Details Patient Name: Date of Service: [name] 11/21/2021 1:00 PM Medical Record Number: [id number] Patient Account Number: [id number] Date of Birth/Sex: Treating RN: Apr 20, 1961 (61 y.o. 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, Jaw HBO Treatment Details Treatment Number: 11 Patient Type: Outpatient Chamber Type: Monoplace Chamber Serial #: [id number] Treatment Protocol: 2.5 ATA with 90 minutes oxygen, with two 5 minute air breaks Treatment Details Compression Rate Down: 2.0 psi / minute De-Compression Rate Up: 2.0 psi / minute A breaks and breathing ir Compress Tx Pressure periods Decompress Decompress Begins Reached (leave unused spaces Begins Ends blank) Chamber Pressure (ATA 1 2.5 2.5 2.5 2.5 2.5 - - 2.5 1 ) Clock Time (24 hr) 13:12 13:26 13:56 14:01 14:31 14:36 - - 15:06 15:18 Treatment Length: 126 (minutes) Treatment Segments: 4 Vital Signs Capillary Blood Glucose Reference Range: 80 - 120 mg / dl HBO Diabetic Blood Glucose Intervention Range: <131 mg/dl or >249 mg/dl Type: Time Vitals Blood Pulse: Respiratory Capillary Blood Glucose Pulse Action Temperature: Taken: Pressure: Rate: Glucose (mg/dl): Meter #: Oximetry (%) Taken: Pre 12:50 110/83 90 16 99.1 97 Patient given 8 oz glucerna Post 15:20 100/78 75 14 98.7 Treatment Response Treatment Toleration: Well Treatment Completion Status: Treatment Completed without Adverse Event Treatment Notes The patient that she had diarrhea and vomiting yesterday. I spoke with Dr. Severiano Gilbert  about this. Dr. Celine Ahr asked for me to check the patient blood sugar. CBG was 97. The patient was given 8oz of Glucerna to drink, and then treatment was started. No problems noted with her treatment. Physician HBO Attestation: I certify that I supervised this HBO treatment in accordance with Medicare guidelines. A trained emergency response team is readily available per Yes hospital policies and procedures. Continue HBOT as ordered. Yes Electronic Signature(s) Signed: 11/21/2021 6:05:11 PM By: [name] MD FACS Previous Signature: 11/21/2021 5:37:46 PM Version By: [name] EMT Entered By: [name] on 11/21/2021 18:05:10 -------------------------------------------------------------------------------- HBO Safety Checklist Details Patient Name: Date of Service: [name] 11/21/2021 1:00 PM Medical Record Number: [id number] Patient Account Number: [id number] Date of Birth/Sex: Treating RN: March 12, 1961 (61 y.o. [name] Primary Care Remas Sobel: RCVEL, [place] RIA [name] Other Clinician: [name] Referring Skyllar Notarianni: Treating Bernadean Saling/Extender: [name] [place] [name] Dillon in Treatment: 2 HBO Safety Checklist Items Safety Checklist Consent Form Signed Patient voided / foley secured and emptied When did you last eato 1030 Last dose of injectable or oral agent NA Ostomy pouch emptied and vented if applicable NA All implantable devices assessed, documented and approved Power Port Intravenous access site secured and place Power [name] (less than 51% polyester) Personal oil-based products / skin lotions / body lotions removed Wigs or hairpieces removed NA Smoking or tobacco materials removed Books / newspapers / magazines / loose paper removed Cologne, aftershave, perfume and deodorant removed Jewelry removed (may wrap wedding band) NA Make-up removed Hair care products  removed Battery operated devices (external) removed Heating patches and chemical warmers removed Titanium eyewear removed NA Nail polish cured greater than 10 hours 11/17/2021 Casting material cured greater than  10 hours NA Hearing aids removed NA Loose dentures or partials removed NA Prosthetics have been removed NA Patient demonstrates correct use of air break device (if applicable) Patient concerns have been addressed Patient grounding bracelet on and cord attached to chamber Specifics for Inpatients (complete in addition to above) Medication sheet sent with patient NA Intravenous medications needed or due during therapy sent with patient NA Drainage tubes (e.g. nasogastric tube or chest tube secured and vented) NA Endotracheal or Tracheotomy tube secured NA Cuff deflated of air and inflated with saline NA Airway suctioned NA Notes The safety checklist was done before treatment started. Electronic Signature(s) Signed: 11/21/2021 5:46:34 PM By: [name] EMT Previous Signature: 11/21/2021 5:31:37 PM Version By: [name] EMT Entered By: [name] on 11/21/2021 17:46:34

## 2021-11-21 NOTE — Progress Notes (Signed)
VERNIDA, MCNICHOLAS ([id number]) Visit Report for 11/21/2021 Problem List Details Patient Name: Date of Service: [name] 11/21/2021 1:00 PM Medical Record Number: [id number] Patient Account Number: [id number] Date of Birth/Sex: Treating RN: 07-13-1960 (61 y.o. 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 11/21/2021 Medical Record Number: [id number] Patient Account Number: [id number] Date of Birth/Sex: Treating RN: [birth year] (61 y.o. 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

## 2021-11-22 ENCOUNTER — Encounter (HOSPITAL_BASED_OUTPATIENT_CLINIC_OR_DEPARTMENT_OTHER): Payer: Medicare Other | Admitting: General Surgery

## 2021-11-22 DIAGNOSIS — M272 Inflammatory conditions of jaws: Secondary | ICD-10-CM | POA: Diagnosis not present

## 2021-11-22 LAB — GLUCOSE, CAPILLARY: Glucose-Capillary: 97 mg/dL (ref 70–99)

## 2021-11-22 NOTE — Progress Notes (Addendum)
[name], [name] ([id number]) Visit Report for 11/22/2021 HBO Details Patient Name: Date of Service: [name] 11/22/2021 1:00 PM Medical Record Number: [id number] Patient Account Number: [id number] Date of Birth/Sex: Treating RN: [birth year] (61 y.o. 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, Jaw HBO Treatment Details Treatment Number: 12 Patient Type: Outpatient Chamber Type: Monoplace Chamber Serial #: [id number] Treatment Protocol: 2.5 ATA with 90 minutes oxygen, with two 5 minute air breaks Treatment Details Compression Rate Down: 2.0 psi / minute De-Compression Rate Up: 2.0 psi / minute A breaks and breathing ir Compress Tx Pressure periods Decompress Decompress Begins Reached (leave unused spaces Begins Ends blank) Chamber Pressure (ATA 1 2.5 2.5 2.5 2.5 2.5 - - 2.5 1 ) Clock Time (24 hr) 12:55 13:08 13:38 13:43 14:13 14:18 - - 14:48 14:59 Treatment Length: 124 (minutes) Treatment Segments: 4 Vital Signs Capillary Blood Glucose Reference Range: 80 - 120 mg / dl HBO Diabetic Blood Glucose Intervention Range: <131 mg/dl or >249 mg/dl Time Vitals Blood Respiratory Capillary Blood Glucose Pulse Action Type: Pulse: Temperature: Taken: Pressure: Rate: Glucose (mg/dl): Meter #: Oximetry (%) Taken: Pre 12:44 94/76 72 16 98.4 Post 15:02 101/76 66 18 98.6 Treatment Response Treatment Toleration: Well Treatment Completion Status: Treatment Completed without Adverse Event Physician HBO Attestation: I certify that I supervised this HBO treatment in accordance with Medicare guidelines. A trained  emergency response team is readily available per Yes hospital policies and procedures. Continue HBOT as ordered. Yes Electronic Signature(s) Signed: 11/22/2021 4:42:32 PM By: [name] MD FACS Previous Signature: 11/22/2021 3:53:14 PM Version By: [name] EMT Entered By: Fredirick Dillon on 11/22/2021 16:42:32 -------------------------------------------------------------------------------- HBO Safety Checklist Details Patient Name: Date of Service: [name] 11/22/2021 1:00 PM Medical Record Number: [id number] Patient Account Number: [id number] Date of Birth/Sex: Treating RN: August 29, 1960 (61 y.o..o. [name] Primary Care Bentleigh Stankus: [place], [place] RIA [name] Other Clinician: [name] Referring [name]: Treating Shyler Holzman/Extender: Fredirick Dillon [place], [place] [name] Weeks in Treatment: 2 HBO Safety Checklist Items Safety Checklist Consent Form Signed Patient voided / foley secured and emptied When did you last eato 1130 Last dose of injectable or oral agent NA Ostomy pouch emptied and vented if applicable NA All implantable devices assessed, documented and approved Power Port Intravenous access site secured and place Power [employer] blend (less than 51% polyester) Personal oil-based products / skin lotions / body lotions removed Wigs or hairpieces removed NA Smoking or tobacco materials removed Books / newspapers / magazines / loose paper removed Cologne, aftershave, perfume and deodorant removed Jewelry removed (may wrap wedding band) NA Make-up removed Hair care products removed Battery operated devices (external) removed Heating patches and chemical warmers removed Titanium eyewear removed NA Nail polish cured greater than 10 hours 11/17/2021 Casting material cured greater than 10 hours NA Hearing aids removed NA Loose dentures or partials removed NA Prosthetics have been  removed NA Patient demonstrates correct use of air break device (if applicable) Patient concerns have been addressed Patient grounding bracelet on and cord attached to chamber Specifics for Inpatients (complete in addition to above) Medication sheet sent with patient NA Intravenous medications  needed or due during therapy sent with patient NA Drainage tubes (e.g. nasogastric tube or chest tube secured and vented) NA Endotracheal or Tracheotomy tube secured NA Cuff deflated of air and inflated with saline NA Airway suctioned NA Electronic Signature(s) Signed: 11/22/2021 3:51:51 PM By: [name] EMT Entered By: [name] on 11/22/2021 15:51:51

## 2021-11-22 NOTE — Progress Notes (Signed)
NAVIA, LINDAHL ([id number]) Visit Report for 11/22/2021 Arrival Information Details Patient Name: Date of Service: [name] 11/22/2021 1:00 PM Medical Record Number: [id number] Patient Account Number: [id number] Date of Birth/Sex: Treating RN: [birth year] (61 y.o. [name] Primary Care Eman Morimoto: DPOEU, [place] RIA [name] Other Clinician: [name] Referring Toney Difatta: Treating Gabrielle Mester/Extender: [name] [place], [place] [name] Weeks in Treatment: 2 Visit Information History Since Last Visit All ordered tests and consults were completed: Yes Patient Arrived: Ambulatory Added or deleted any medications: No Arrival Time: 12:26 Any new allergies or adverse reactions: No Accompanied By: None Had a fall or experienced change in No Transfer Assistance: None activities of daily living that may affect Patient Identification Verified: Yes risk of falls: Secondary Verification Process Completed: Yes Signs or symptoms of abuse/neglect since last visito No Patient Requires Transmission-Based Precautions: No Hospitalized since last visit: No Patient Has Alerts: No Implantable device outside of the clinic excluding No cellular tissue based products placed in the center since last visit: Pain Present Now: No Electronic Signature(s) Signed: 11/22/2021 3:49:38 PM By: [name] EMT Entered By: [name] on 11/22/2021 15:49:38 -------------------------------------------------------------------------------- Encounter Discharge Information Details Patient Name: Date of Service: [name] 11/22/2021 1:00 PM Medical Record Number: [id number] Patient Account Number: [id number] Date of Birth/Sex: Treating RN: Apr 15, 1961 (61 y.o. [name] Primary Care [name]: XVQMG, [place] RIA [name] Other Clinician: [name] Referring Sanvi Ehler: Treating Anaiza Behrens/Extender: [name] [place], [place] RIA Quay Burow in Treatment: 2 Encounter Discharge  Information Items Discharge Condition: Stable Ambulatory Status: Ambulatory Discharge Destination: Home Transportation: Private Auto Accompanied By: None Schedule Follow-up Appointment: Yes Clinical Summary of Care: Electronic Signature(s) Signed: 11/22/2021 3:54:19 PM By: [name] EMT Entered By: [name] on 11/22/2021 15:54:19 -------------------------------------------------------------------------------- Vitals Details Patient Name: Date of Service: [name] 11/22/2021 1:00 PM Medical Record Number: [id number] Patient Account Number: [id number] Date of Birth/Sex: Treating RN: [birth year] (61 y.o. [name] Primary Care [name]: TOIZT, MA RIA [name] Other Clinician: [name] Referring Kiyra Slaubaugh: Treating Denim Kalmbach/Extender: [name] [place], [place] [name] Weeks in Treatment: 2 Vital Signs Time Taken: 12:44 Temperature (F): 98.4 Height (in): 60 Pulse (bpm): 72 Weight (lbs): 88 Respiratory Rate (breaths/min): 16 Body Mass Index (BMI): 17.2 Blood Pressure (mmHg): 94/76 Reference Range: 80 - 120 mg / dl Electronic Signature(s) Signed: 11/22/2021 3:50:08 PM By: [name] EMT Entered By: [name] on 11/22/2021 15:50:08

## 2021-11-22 NOTE — Progress Notes (Signed)
[name], AMAKER ([id number]) Visit Report for 11/22/2021 Problem List Details Patient Name: Date of Service: [name] Nena [name] D. 11/22/2021 1:00 PM Medical Record Number: [id number] Patient Account Number: [id number] Date of Birth/Sex: Treating RN: 1961/05/09 (61 y.o. 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 11/22/2021 Medical Record Number: [id number] Patient Account Number: [id number] Date of Birth/Sex: Treating RN: February 25, 1961 (61 y.o. 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

## 2021-11-23 ENCOUNTER — Encounter (HOSPITAL_BASED_OUTPATIENT_CLINIC_OR_DEPARTMENT_OTHER): Payer: Medicare Other | Admitting: General Surgery

## 2021-11-23 DIAGNOSIS — M272 Inflammatory conditions of jaws: Secondary | ICD-10-CM | POA: Diagnosis not present

## 2021-11-23 NOTE — Progress Notes (Signed)
JAYCEE, MCKELLIPS ([id number]) Visit Report for 11/23/2021 Problem List Details Patient Name: Date of Service: [name] 11/23/2021 1:00 PM Medical Record Number: [id number] Patient Account Number: [id number] Date of Birth/Sex: Treating RN: February 01, 1961 (61 y.o. 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 11/23/2021 Medical Record Number: [id number] Patient Account Number: [id number] Date of Birth/Sex: Treating RN: [birth year] (61 y.o. 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

## 2021-11-23 NOTE — Progress Notes (Addendum)
DEVYN, GRIFFING ([id number]) Visit Report for 11/23/2021 Arrival Information Details Patient Name: Date of Service: [name] 11/23/2021 1:00 PM Medical Record Number: [id number] Patient Account Number: [id number] Date of Birth/Sex: Treating RN: 1961-05-04 (61 y.o. [name] Primary Care Alliene Klugh: IWLNL, MA RIA [name] Other Clinician: [name] Referring Laquandra Carrillo: Treating Farris Blash/Extender: [name] [place], MA [name] Weeks in Treatment: 2 Visit Information History Since Last Visit All ordered tests and consults were completed: Yes Patient Arrived: Ambulatory Added or deleted any medications: No Arrival Time: 12:40 Any new allergies or adverse reactions: No Accompanied By: None Had a fall or experienced change in No Transfer Assistance: None activities of daily living that may affect Patient Identification Verified: Yes risk of falls: Secondary Verification Process Completed: Yes Signs or symptoms of abuse/neglect since last visito No Patient Requires Transmission-Based Precautions: No Hospitalized since last visit: No Patient Has Alerts: No Implantable device outside of the clinic excluding No cellular tissue based products placed in the center since last visit: Pain Present Now: No Electronic Signature(s) Signed: 11/23/2021 2:12:04 PM By: [name] EMT Entered By: [name] on 11/23/2021 14:12:04 -------------------------------------------------------------------------------- Encounter Discharge Information Details Patient Name: Date of Service: [name] 11/23/2021 1:00 PM Medical Record Number: [id number] Patient Account Number: [id number] Date of Birth/Sex: Treating RN: Jun 29, 1961 (62 y.o. [name] Primary Care Sumiye Hirth: EYCXK, MA RIA [name] Other Clinician: [name] Referring Leanah Kolander: Treating Marget Outten/Extender: [name] [place], [place] RIA Quay Burow in Treatment: 2 Encounter Discharge  Information Items Discharge Condition: Stable Ambulatory Status: Ambulatory Discharge Destination: Home Transportation: Private Auto Accompanied By: None Schedule Follow-up Appointment: Yes Clinical Summary of Care: Electronic Signature(s) Signed: 11/23/2021 3:13:14 PM By: [name] EMT Entered By: [name] on 11/23/2021 15:13:13 -------------------------------------------------------------------------------- Vitals Details Patient Name: Date of Service: [name] 11/23/2021 1:00 PM Medical Record Number: [id number] Patient Account Number: [id number] Date of Birth/Sex: Treating RN: 01/13/1961 (61 y.o. [name] Primary Care Camaya Gannett: HFWYO, MA RIA [name] Other Clinician: [name] Referring Dyesha Henault: Treating Glorimar Stroope/Extender: [name] [place], MA [name] Weeks in Treatment: 2 Vital Signs Time Taken: 12:50 Temperature (F): 98.1 Height (in): 60 Pulse (bpm): 75 Weight (lbs): 88 Respiratory Rate (breaths/min): 18 Body Mass Index (BMI): 17.2 Blood Pressure (mmHg): 128/98 Reference Range: 80 - 120 mg / dl Electronic Signature(s) Signed: 11/23/2021 2:12:45 PM By: [name] EMT Entered By: [name] on 11/23/2021 14:12:44

## 2021-11-23 NOTE — Progress Notes (Addendum)
DEVLYN, PARISH ([id number]) Visit Report for 11/23/2021 HBO Details Patient Name: Date of Service: [name] 11/23/2021 1:00 PM Medical Record Number: [id number] Patient Account Number: [id number] Date of Birth/Sex: Treating RN: 1960-12-16 (61 y.o. 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, Jaw HBO Treatment Details Treatment Number: 13 Patient Type: Outpatient Chamber Type: Monoplace Chamber Serial #: [id number] Treatment Protocol: 2.5 ATA with 90 minutes oxygen, with two 5 minute air breaks Treatment Details Compression Rate Down: 2.0 psi / minute De-Compression Rate Up: 2.0 psi / minute A breaks and breathing ir Compress Tx Pressure periods Decompress Decompress Begins Reached (leave unused spaces Begins Ends blank) Chamber Pressure (ATA 1 2.5 2.5 2.5 2.5 2.5 - - 2.5 1 ) Clock Time (24 hr) 12:57 13:11 13:41 13:46 14:15 14:21 - - 14:52 15:03 Treatment Length: 126 (minutes) Treatment Segments: 4 Vital Signs Capillary Blood Glucose Reference Range: 80 - 120 mg / dl HBO Diabetic Blood Glucose Intervention Range: <131 mg/dl or >249 mg/dl Time Vitals Blood Respiratory Capillary Blood Glucose Pulse Action Type: Pulse: Temperature: Taken: Pressure: Rate: Glucose (mg/dl): Meter #: Oximetry (%) Taken: Pre 12:50 128/98 75 18 98.1 Post 15:04 101/77 64 16 98.1 Treatment Response Treatment Toleration: Well Treatment Completion Status: Treatment Completed without Adverse Event Physician HBO Attestation: I certify that I supervised this HBO treatment in accordance with Medicare guidelines. A trained  emergency response team is readily available per Yes hospital policies and procedures. Continue HBOT as ordered. Yes Electronic Signature(s) Signed: 11/23/2021 4:19:10 PM By: [name] MD FACS Previous Signature: 11/23/2021 3:08:33 PM Version By: [name] EMT Previous Signature: 11/23/2021 2:14:43 PM Version By: [name] EMT Entered By: [name] on 11/23/2021 16:19:10 -------------------------------------------------------------------------------- HBO Safety Checklist Details Patient Name: Date of Service: [name] 11/23/2021 1:00 PM Medical Record Number: [id number] Patient Account Number: [id number] Date of Birth/Sex: Treating RN: [birth year] (61 y.o. [name] Primary Care Laurieanne Galloway: B[place], [place] RIA [name] Other Clinician: [name] Referring Senovia Gauer: Treating Locklan Canoy/Extender: [name] [place], MA [name] Weeks in Treatment: 2 HBO Safety Checklist Items Safety Checklist Consent Form Signed Patient voided / foley secured and emptied When did you last eato 1100 Last dose of injectable or oral agent NA Ostomy pouch emptied and vented if applicable NA All implantable devices assessed, documented and approved Power Port Intravenous access site secured and place Power [employer] blend (less than 51% polyester) Personal oil-based products / skin lotions / body lotions removed Wigs or hairpieces removed NA Smoking or tobacco materials removed Books / newspapers / magazines / loose paper removed Cologne, aftershave, perfume and deodorant removed Jewelry removed (may wrap wedding band) NA Make-up removed Hair care products removed Battery operated devices (external) removed Heating patches and chemical warmers removed Titanium eyewear removed NA Nail polish cured greater than 10 hours 11/17/2021 Casting material cured greater than 10 hours NA Hearing aids  removed NA Loose dentures or partials removed NA Prosthetics have been removed NA Patient demonstrates correct use of air break device (if applicable) Patient concerns have been addressed Patient grounding bracelet on and cord attached to chamber Specifics for Inpatients (complete in addition  to above) Medication sheet sent with patient NA Intravenous medications needed or due during therapy sent with patient NA Drainage tubes (e.g. nasogastric tube or chest tube secured and vented) NA Endotracheal or Tracheotomy tube secured NA Cuff deflated of air and inflated with saline NA Airway suctioned NA Electronic Signature(s) Signed: 11/23/2021 2:13:59 PM By: [name] EMT Entered By: [name] on 11/23/2021 14:13:58

## 2021-11-27 ENCOUNTER — Encounter (HOSPITAL_BASED_OUTPATIENT_CLINIC_OR_DEPARTMENT_OTHER): Payer: Medicare Other | Admitting: Internal Medicine

## 2021-11-27 DIAGNOSIS — M272 Inflammatory conditions of jaws: Secondary | ICD-10-CM | POA: Diagnosis not present

## 2021-11-27 DIAGNOSIS — C411 Malignant neoplasm of mandible: Secondary | ICD-10-CM

## 2021-11-27 DIAGNOSIS — Z923 Personal history of irradiation: Secondary | ICD-10-CM | POA: Diagnosis not present

## 2021-11-27 DIAGNOSIS — M8788 Other osteonecrosis, other site: Secondary | ICD-10-CM | POA: Diagnosis not present

## 2021-11-27 NOTE — Progress Notes (Addendum)
[name], [name] ([id number]) Visit Report for 11/27/2021 HBO Details Patient Name: Date of Service: [name] 11/27/2021 1:00 PM Medical Record Number: [id number] Patient Account Number: [id number] Date of Birth/Sex: Treating RN: September 28, 1960 (61 y.o. 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, Jaw HBO Treatment Details Treatment Number: 14 Patient Type: Outpatient Chamber Type: Monoplace Chamber Serial #: [id number] Treatment Protocol: 2.5 ATA with 90 minutes oxygen, with two 5 minute air breaks Treatment Details Compression Rate Down: 2.0 psi / minute De-Compression Rate Up: 2.0 psi / minute A breaks and breathing ir Compress Tx Pressure periods Decompress Decompress Begins Reached (leave unused spaces Begins Ends blank) Chamber Pressure (ATA 1 2.5 2.5 2.5 2.5 2.5 - - 2.5 1 ) Clock Time (24 hr) 12:50 13:01 13:32 13:37 14:07 14:12 - - 14:42 14:50 Treatment Length: 120 (minutes) Treatment Segments: 4 Vital Signs Capillary Blood Glucose Reference Range: 80 - 120 mg / dl HBO Diabetic Blood Glucose Intervention Range: <131 mg/dl or >249 mg/dl Time Vitals Blood Respiratory Capillary Blood Glucose Pulse Action Type: Pulse: Temperature: Taken: Pressure: Rate: Glucose (mg/dl): Meter #: Oximetry (%) Taken: Pre 12:44 109/79 79 16 98.1 Post 14:53 100/70 63 16 98.1 Treatment Response Treatment Toleration: Well Treatment Completion Status: Treatment Completed without Adverse Event Physician HBO Attestation: I certify that I supervised this HBO treatment in accordance with Medicare guidelines. A trained  emergency response team is readily available per Yes hospital policies and procedures. Continue HBOT as ordered. Yes Electronic Signature(s) Signed: 11/27/2021 3:58:54 PM By: [name] DO Previous Signature: 11/27/2021 3:54:53 PM Version By: Donavan Burnet CHT EMT BS , , Previous Signature: 11/27/2021 3:10:43 PM Version By: Donavan Burnet CHT EMT BS , , Entered By: [name] on 11/27/2021 15:58:26 -------------------------------------------------------------------------------- HBO Safety Checklist Details Patient Name: Date of Service: [name] 11/27/2021 1:00 PM Medical Record Number: [id number] Patient Account Number: [id number] Date of Birth/Sex: Treating RN: 04/01/1961 (61 y.o. [name] Primary Care Suyash Amory: [name] Other Clinician: Donavan Burnet Referring Ruchama Kubicek: Treating Nainoa Woldt/Extender: [name] Weeks in Treatment: 3 HBO Safety Checklist Items Safety Checklist Consent Form Signed Patient voided / foley secured and emptied When did you last eato 1030 Last dose of injectable or oral agent n/a Ostomy pouch emptied and vented if applicable NA All implantable devices assessed, documented and approved Power Port Intravenous access site secured and place Power [employer] blend (less than 51% polyester) Personal oil-based products / skin lotions / body lotions removed Wigs or hairpieces removed NA Smoking or tobacco materials removed Books / newspapers / magazines / loose paper removed Cologne, aftershave, perfume and deodorant removed Jewelry removed (may wrap wedding band) Make-up removed Hair care products removed Battery operated devices (external) removed Heating patches and chemical warmers removed Titanium eyewear removed NA Nail polish cured greater than 10 hours Casting material cured greater than 10 hours NA Hearing aids  removed NA Loose dentures or partials removed NA Prosthetics have been removed NA Patient demonstrates correct use of air break device (if applicable) Patient concerns have been addressed Patient grounding bracelet on and cord attached to chamber Specifics for Inpatients (complete in addition to above) Medication  sheet sent with patient NA Intravenous medications needed or due during therapy sent with patient NA Drainage tubes (e.g. nasogastric tube or chest tube secured and vented) NA Endotracheal or Tracheotomy tube secured NA Cuff deflated of air and inflated with saline NA Airway suctioned NA Notes Paper version used prior to treatment. Electronic Signature(s) Signed: 12/03/2021 11:16:55 AM By: Donavan Burnet CHT EMT BS , , Previous Signature: 11/27/2021 3:09:27 PM Version By: Donavan Burnet CHT EMT BS , , Entered By: Donavan Burnet on 11/30/2021 10:50:12

## 2021-11-27 NOTE — Progress Notes (Addendum)
SIYAH, MAULT ([id number]) Visit Report for 11/27/2021 Arrival Information Details Patient Name: Date of Service: [name] 11/27/2021 1:00 PM Medical Record Number: [id number] Patient Account Number: [id number] Date of Birth/Sex: Treating RN: 11/29/1960 (62 y.o. [name] Primary Care Yousra Ivens: [place], [place] RIA [name] Other Clinician: Donavan Burnet Referring Sonjia Wilcoxson: Treating Sumeet Geter/Extender: [name] [place] [name] Weeks in Treatment: 3 Visit Information History Since Last Visit All ordered tests and consults were completed: Yes Patient Arrived: Ambulatory Added or deleted any medications: No Arrival Time: 12:36 Any new allergies or adverse reactions: No Accompanied By: self Had a fall or experienced change in No Transfer Assistance: None activities of daily living that may affect Patient Identification Verified: Yes risk of falls: Secondary Verification Process Completed: Yes Signs or symptoms of abuse/neglect since last visito No Patient Requires Transmission-Based Precautions: No Hospitalized since last visit: No Patient Has Alerts: No Implantable device outside of the clinic excluding No cellular tissue based products placed in the center since last visit: Pain Present Now: No Electronic Signature(s) Signed: 11/27/2021 3:06:45 PM By: Donavan Burnet CHT EMT BS , , Entered By: Donavan Burnet on 11/27/2021 15:06:45 -------------------------------------------------------------------------------- Encounter Discharge Information Details Patient Name: Date of Service: [name] 11/27/2021 1:00 PM Medical Record Number: [id number] Patient Account Number: [id number] Date of Birth/Sex: Treating RN: 1961-06-27 (61 y.o. [name] Primary Care Karri Kallenbach: JWTGR, [place] RIA [name] Other Clinician: [name] Referring Weslynn Ke: Treating Jaeveon Ashland/Extender: [name] [place] [name] Weeks in Treatment:  3 Encounter Discharge Information Items Discharge Condition: Stable Ambulatory Status: Ambulatory Discharge Destination: Home Transportation: Private Auto Accompanied By: self Schedule Follow-up Appointment: No Clinical Summary of Care: Electronic Signature(s) Signed: 11/27/2021 3:55:36 PM By: Donavan Burnet CHT EMT BS , , Entered By: Donavan Burnet on 11/27/2021 15:55:36 -------------------------------------------------------------------------------- Vitals Details Patient Name: Date of Service: [name] 11/27/2021 1:00 PM Medical Record Number: [id number] Patient Account Number: [id number] Date of Birth/Sex: Treating RN: 01-05-1961 (61 y.o. [name] Primary Care Virdia Ziesmer: [place] RIA [name] Other Clinician: Donavan Burnet Referring Kambrey Hagger: Treating Nichole Neyer/Extender: [name] [place] [name] Weeks in Treatment: 3 Vital Signs Time Taken: 12:44 Temperature (F): 98.1 Height (in): 60 Pulse (bpm): 79 Weight (lbs): 88 Respiratory Rate (breaths/min): 16 Body Mass Index (BMI): 17.2 Blood Pressure (mmHg): 109/79 Reference Range: 80 - 120 mg / dl Electronic Signature(s) Signed: 11/27/2021 3:07:41 PM By: Donavan Burnet CHT EMT BS , , Entered By: Donavan Burnet on 11/27/2021 15:07:41

## 2021-11-27 NOTE — Progress Notes (Signed)
[name], [name] ([id number]) Visit Report for 11/27/2021 SuperBill Details Patient Name: Date of Service: [name] 11/27/2021 Medical Record Number: [id number] Patient Account Number: [id number] Date of Birth/Sex: Treating RN: April 26, 1961 (61 y.o. 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, unspecified Facility Procedures CPT4 Code Description Modifier Quantity [id number] G0277-(Facility Use Only) HBOT full body chamber, 76mn , 4 ICD-10 Diagnosis Description 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

## 2021-11-28 ENCOUNTER — Encounter (HOSPITAL_BASED_OUTPATIENT_CLINIC_OR_DEPARTMENT_OTHER): Payer: Medicare Other | Admitting: General Surgery

## 2021-11-28 DIAGNOSIS — M272 Inflammatory conditions of jaws: Secondary | ICD-10-CM | POA: Diagnosis not present

## 2021-11-28 NOTE — Progress Notes (Signed)
[name], [name] ([id number]) Visit Report for 11/28/2021 SuperBill Details Patient Name: Date of Service: [name] 11/28/2021 Medical Record Number: [id number] Patient Account Number: [id number] Date of Birth/Sex: Treating RN: 12-24-1960 (61 y.o. 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, unspecified Facility Procedures CPT4 Code Description Modifier Quantity [id number] G0277-(Facility Use Only) HBOT full body chamber, 13mn , 4 ICD-10 Diagnosis Description 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

## 2021-11-28 NOTE — Progress Notes (Addendum)
[name], [name] ([id number]) Visit Report for 11/28/2021 HBO Details Patient Name: Date of Service: [name] 11/28/2021 1:00 PM Medical Record Number: [id number] Patient Account Number: [id number] Date of Birth/Sex: Treating RN: [birth year] (61 y.o. 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, Jaw HBO Treatment Details Treatment Number: 15 Patient Type: Outpatient Chamber Type: Monoplace Chamber Serial #: [id number] Treatment Protocol: 2.5 ATA with 90 minutes oxygen, with two 5 minute air breaks Treatment Details Compression Rate Down: 2.0 psi / minute De-Compression Rate Up: 2.0 psi / minute A breaks and breathing ir Compress Tx Pressure periods Decompress Decompress Begins Reached (leave unused spaces Begins Ends blank) Chamber Pressure (ATA 1 2.5 2.5 2.5 2.5 2.5 - - 2.5 1 ) Clock Time (24 hr) 12:43 12:54 13:24 13:29 13:59 14:04 - - 14:34 14:45 Treatment Length: 122 (minutes) Treatment Segments: 4 Vital Signs Capillary Blood Glucose Reference Range: 80 - 120 mg / dl HBO Diabetic Blood Glucose Intervention Range: <131 mg/dl or >249 mg/dl Type: Time Vitals Blood Pulse: Respiratory Temperature: Capillary Blood Glucose Pulse Action Taken: Pressure: Rate: Glucose (mg/dl): Meter #: Oximetry (%) Taken: Pre 12:35 102/68 76 16 98.8 none per protocol Post 14:48 54/56 64 16 98 systolic BP below 256, asymptomatic Treatment Response Treatment Toleration: Well Treatment Completion Status: Treatment Completed without Adverse Event Physician HBO Attestation: I certify that I supervised this HBO treatment in  accordance with Medicare guidelines. A trained emergency response team is readily available per Yes hospital policies and procedures. Continue HBOT as ordered. Yes Electronic Signature(s) Signed: 11/28/2021 5:03:26 PM By: Theresa Maudlin MD FACS Previous Signature: 11/28/2021 2:57:52 PM Version By: [name] CHT EMT BS , , Entered By: [name] on 11/28/2021 17:03:25 -------------------------------------------------------------------------------- HBO Safety Checklist Details Patient Name: Date of Service: [name] 11/28/2021 1:00 PM Medical Record Number: [id number] Patient Account Number: [id number] Date of Birth/Sex: Treating RN: 1960-10-21 (61 y.o. [name] Primary Care [name]: [name] Other Clinician: Donavan Dillon Referring [name]: Treating [name]/Extender: [name] Weeks in Treatment: 3 HBO Safety Checklist Items Safety Checklist Consent Form Signed Patient voided / foley secured and emptied When did you last eato 1030 Last dose of injectable or oral agent n/a Ostomy pouch emptied and vented if applicable NA All implantable devices assessed, documented and approved Power Port Intravenous access site secured and place Power [employer] blend (less than 51% polyester) Personal oil-based products / skin lotions / body lotions removed Wigs or hairpieces removed NA Smoking or tobacco materials removed NA Books / newspapers / magazines / loose paper removed Cologne, aftershave, perfume and deodorant removed Jewelry removed (may wrap wedding band) Make-up removed Hair care products removed Battery operated devices (external) removed Heating patches and chemical warmers removed Titanium eyewear removed NA Nail polish cured greater than 10 hours Casting material cured greater than 10 hours NA Hearing aids removed NA Loose dentures or  partials removed NA Prosthetics have been removed NA Patient demonstrates correct use of air break device (if applicable) Patient concerns have been addressed Patient grounding bracelet on and cord attached to chamber Specifics for Inpatients (complete in addition to above) Medication sheet sent with patient  NA Intravenous medications needed or due during therapy sent with patient NA Drainage tubes (e.g. nasogastric tube or chest tube secured and vented) NA Endotracheal or Tracheotomy tube secured NA Cuff deflated of air and inflated with saline NA Airway suctioned NA Notes Paper version used prior to treatment. Electronic Signature(s) Signed: 12/03/2021 11:16:55 AM By: [name] CHT EMT BS , , Previous Signature: 11/28/2021 2:57:52 PM Version By: [name] CHT EMT BS , , Entered By: [name] on 11/30/2021 10:49:32

## 2021-11-28 NOTE — Progress Notes (Signed)
TONNETTE, ZWIEBEL ([id number]) Visit Report for 11/28/2021 Arrival Information Details Patient Name: Date of Service: [name] 11/28/2021 1:00 PM Medical Record Number: [id number] Patient Account Number: [id number] Date of Birth/Sex: Treating RN: 03/07/1961 (60 y.o. [name] Primary Care Mychelle Kendra: [place] RIA [name] Other Clinician: Donavan Burnet Referring Kristell Wooding: Treating Jerell Demery/Extender: [name] [place] [name] Weeks in Treatment: 3 Visit Information History Since Last Visit All ordered tests and consults were completed: Yes Patient Arrived: Ambulatory Added or deleted any medications: No Arrival Time: 12:20 Any new allergies or adverse reactions: No Accompanied By: self Had a fall or experienced change in No Transfer Assistance: None activities of daily living that may affect Patient Identification Verified: Yes risk of falls: Secondary Verification Process Completed: Yes Signs or symptoms of abuse/neglect since last visito No Patient Requires Transmission-Based Precautions: No Hospitalized since last visit: No Patient Has Alerts: No Implantable device outside of the clinic excluding No cellular tissue based products placed in the center since last visit: Pain Present Now: No Electronic Signature(s) Signed: 11/28/2021 2:57:52 PM By: Donavan Burnet CHT EMT BS , , Entered By: Donavan Burnet on 11/28/2021 14:26:56 -------------------------------------------------------------------------------- Encounter Discharge Information Details Patient Name: Date of Service: [name] 11/28/2021 1:00 PM Medical Record Number: [id number] Patient Account Number: [id number] Date of Birth/Sex: Treating RN: 1960-09-10 (61 y.o. [name] Primary Care Jailynne Opperman: RCBUL, [place] RIA [name] Other Clinician: Donavan Burnet Referring Cressie [name]: Treating Donyell Ding/Extender: [name] [place] RIA Quay Burow in Treatment:  3 Encounter Discharge Information Items Discharge Condition: Stable Ambulatory Status: Ambulatory Discharge Destination: Home Transportation: Private Auto Accompanied By: self Schedule Follow-up Appointment: No Clinical Summary of Care: Electronic Signature(s) Signed: 11/28/2021 2:57:52 PM By: Donavan Burnet CHT EMT BS , , Entered By: Donavan Burnet on 11/28/2021 14:57:22 -------------------------------------------------------------------------------- Vitals Details Patient Name: Date of Service: [name] 11/28/2021 1:00 PM Medical Record Number: [id number] Patient Account Number: [id number] Date of Birth/Sex: Treating RN: 09-19-1960 (61 y.o. [name] Primary Care Chrystopher Stangl: [place], [place] RIA [name] Other Clinician: Donavan Burnet Referring Sammuel Blick: Treating Cayleb Jarnigan/Extender: [name] [place] [name] Weeks in Treatment: 3 Vital Signs Time Taken: 12:35 Temperature (F): 98.8 Height (in): 60 Pulse (bpm): 76 Weight (lbs): 88 Respiratory Rate (breaths/min): 16 Body Mass Index (BMI): 17.2 Blood Pressure (mmHg): 102/68 Reference Range: 80 - 120 mg / dl Electronic Signature(s) Signed: 11/28/2021 2:57:52 PM By: Donavan Burnet CHT EMT BS , , Entered By: Donavan Burnet on 11/28/2021 14:27:20

## 2021-11-29 ENCOUNTER — Encounter (HOSPITAL_BASED_OUTPATIENT_CLINIC_OR_DEPARTMENT_OTHER): Payer: Medicare Other | Attending: General Surgery | Admitting: General Surgery

## 2021-11-29 DIAGNOSIS — Z85810 Personal history of malignant neoplasm of tongue: Secondary | ICD-10-CM | POA: Diagnosis not present

## 2021-11-29 DIAGNOSIS — M8788 Other osteonecrosis, other site: Secondary | ICD-10-CM | POA: Diagnosis present

## 2021-11-29 DIAGNOSIS — Z923 Personal history of irradiation: Secondary | ICD-10-CM | POA: Diagnosis not present

## 2021-11-29 DIAGNOSIS — Z9221 Personal history of antineoplastic chemotherapy: Secondary | ICD-10-CM | POA: Diagnosis not present

## 2021-11-29 DIAGNOSIS — Z87891 Personal history of nicotine dependence: Secondary | ICD-10-CM | POA: Diagnosis not present

## 2021-11-29 DIAGNOSIS — M272 Inflammatory conditions of jaws: Secondary | ICD-10-CM | POA: Insufficient documentation

## 2021-11-29 DIAGNOSIS — Z8589 Personal history of malignant neoplasm of other organs and systems: Secondary | ICD-10-CM | POA: Diagnosis not present

## 2021-11-29 NOTE — Progress Notes (Signed)
JAMA[place], INTHAVONG ([id number]) Visit Report for 11/29/2021 Arrival Information Details Patient Name: Date of Service: [name] 11/29/2021 1:00 PM Medical Record Number: [id number] Patient Account Number: [id number] Date of Birth/Sex: Treating RN: [birth year] (61 y.o. [name] Primary Care Orean Giarratano: [name] Other Clinician: [name] Referring Aylissa Heinemann: Treating Perla Echavarria/Extender: Mathews Argyle in Treatment: 3 Visit Information History Since Last Visit All ordered tests and consults were completed: Yes Patient Arrived: Ambulatory Added or deleted any medications: No Arrival Time: 12:30 Any new allergies or adverse reactions: No Accompanied By: None Had a fall or experienced change in No Transfer Assistance: None activities of daily living that may affect Patient Identification Verified: Yes risk of falls: Secondary Verification Process Completed: Yes Signs or symptoms of abuse/neglect since last visito No Patient Requires Transmission-Based Precautions: No Hospitalized since last visit: No Patient Has Alerts: No Implantable device outside of the clinic excluding No cellular tissue based products placed in the center since last visit: Pain Present Now: No Electronic Signature(s) Signed: 11/29/2021 3:43:46 PM By: [name] EMT Entered By: [name] on 11/29/2021 15:43:46 -------------------------------------------------------------------------------- Encounter Discharge Information Details Patient Name: Date of Service: [name] 11/29/2021 1:00 PM Medical Record Number: [id number] Patient Account Number: [id number] Date of Birth/Sex: Treating RN: [birth year] (61 y.o. [name] Primary Care Voncille Simm: [name] Other Clinician: [name] Referring Leatrice Parilla: Treating Nayan Proch/Extender: Mathews Argyle in Treatment: 3 Encounter Discharge Information  Items Discharge Condition: Stable Ambulatory Status: Ambulatory Discharge Destination: Home Transportation: Private Auto Accompanied By: None Schedule Follow-up Appointment: Yes Clinical Summary of Care: Electronic Signature(s) Signed: 11/29/2021 3:51:43 PM By: [name] EMT Entered By: [name] on 11/29/2021 15:51:42 -------------------------------------------------------------------------------- Vitals Details Patient Name: Date of Service: [name] 11/29/2021 1:00 PM Medical Record Number: [id number] Patient Account Number: [id number] Date of Birth/Sex: Treating RN: [birth year] (61 y.o. [name] Primary Care Jerah Esty: [name] Other Clinician: [name] Referring Quincy Prisco: Treating Ziyad Dyar/Extender: [name] Weeks in Treatment: 3 Vital Signs Time Taken: 12:33 Temperature (F): 98.5 Height (in): 60 Pulse (bpm): 80 Weight (lbs): 88 Respiratory Rate (breaths/min): 16 Body Mass Index (BMI): 17.2 Blood Pressure (mmHg): 108/74 Reference Range: 80 - 120 mg / dl Electronic Signature(s) Signed: 11/29/2021 3:44:29 PM By: [name] EMT Entered By: [name] on 11/29/2021 15:44:28

## 2021-11-29 NOTE — Progress Notes (Signed)
HOLLACE, MICHELLI ([id number]) Visit Report for 11/29/2021 Problem List Details Patient Name: Date of Service: Theresa LLA [name] 11/29/2021 1:00 PM Medical Record Number: [id number] Patient Account Number: [id number] Date of Birth/Sex: Treating RN: 06-07-1961 (61 y.o. 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 11/29/2021 Medical Record Number: [id number] Patient Account Number: [id number] Date of Birth/Sex: Treating RN: [birth year] (61 y.o. 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

## 2021-11-29 NOTE — Progress Notes (Addendum)
[name], Dillon ([id number]) Visit Report for 11/29/2021 HBO Details Patient Name: Date of Service: [name] 11/29/2021 1:00 PM Medical Record Number: [id number] Patient Account Number: [id number] Date of Birth/Sex: Treating RN: 1961-02-18 (61 y.o. 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, Jaw HBO Treatment Details Treatment Number: 16 Patient Type: Outpatient Chamber Type: Monoplace Chamber Serial #: [id number] Treatment Protocol: 2.5 ATA with 90 minutes oxygen, with two 5 minute air breaks Treatment Details Compression Rate Down: 2.0 psi / minute De-Compression Rate Up: 2.0 psi / minute A breaks and breathing ir Compress Tx Pressure periods Decompress Decompress Begins Reached (leave unused spaces Begins Ends blank) Chamber Pressure (ATA 1 2.5 2.5 2.5 2.5 2.5 - - 2.5 1 ) Clock Time (24 hr) 12:38 12:51 13:21 13:26 13:56 14:02 - - 14:32 14:43 Treatment Length: 125 (minutes) Treatment Segments: 4 Vital Signs Capillary Blood Glucose Reference Range: 80 - 120 mg / dl HBO Diabetic Blood Glucose Intervention Range: <131 mg/dl or >249 mg/dl Time Vitals Blood Respiratory Capillary Blood Glucose Pulse Action Type: Pulse: Temperature: Taken: Pressure: Rate: Glucose (mg/dl): Meter #: Oximetry (%) Taken: Pre 12:33 108/74 80 16 98.5 Post 14:47 99/75 61 16 98.2 Treatment Response Treatment Toleration: Well Treatment Completion Status: Treatment Completed without Adverse Event Physician HBO Attestation: I certify that I supervised this HBO treatment in accordance with Medicare guidelines. A trained emergency  response team is readily available per Yes hospital policies and procedures. Continue HBOT as ordered. Yes Electronic Signature(s) Signed: 11/29/2021 4:49:54 PM By: [name] MD FACS Previous Signature: 11/29/2021 3:50:34 PM Version By: [name] EMT Entered By: [name] on 11/29/2021 16:49:53 -------------------------------------------------------------------------------- HBO Safety Checklist Details Patient Name: Date of Service: [name] 11/29/2021 1:00 PM Medical Record Number: [id number] Patient Account Number: [id number] Date of Birth/Sex: Treating RN: 08/10/1960 (61 y.o. [name] Primary Care Yaseen Gilberg: [name] Other Clinician: [name] Referring Essence Merle: Treating Sharel Behne/Extender: [name] Weeks in Treatment: 3 HBO Safety Checklist Items Safety Checklist Consent Form Signed Patient voided / foley secured and emptied When did you last eato 1030 Last dose of injectable or oral agent NA Ostomy pouch emptied and vented if applicable NA All implantable devices assessed, documented and approved Power Port Intravenous access site secured and place Power [employer] blend (less than 51% polyester) Personal oil-based products / skin lotions / body lotions removed Wigs or hairpieces removed NA Smoking or tobacco materials removed Books / newspapers / magazines / loose paper removed Cologne, aftershave, perfume and deodorant removed Jewelry removed (may wrap wedding band) NA Make-up removed Hair care products removed Battery operated devices (external) removed Heating patches and chemical warmers removed NA Titanium eyewear removed NA Nail polish cured greater than 10 hours 11/24/2021 Casting material cured greater than 10 hours NA Hearing aids removed NA Loose dentures or partials removed NA Prosthetics have been removed NA Patient  demonstrates correct use of air break device (if applicable) Patient concerns have been addressed Patient grounding bracelet on and cord attached to chamber Specifics for Inpatients (complete in addition to above) Medication sheet sent with patient NA Intravenous medications needed or due during therapy sent with  patient NA Drainage tubes (e.g. nasogastric tube or chest tube secured and vented) NA Endotracheal or Tracheotomy tube secured NA Cuff deflated of air and inflated with saline NA Airway suctioned NA Notes Safety checklist was done before treatment started. Electronic Signature(s) Signed: 11/29/2021 3:54:51 PM By: [name] EMT Previous Signature: 11/29/2021 3:48:14 PM Version By: [name] EMT Entered By: [name] on 11/29/2021 15:54:51

## 2021-11-30 ENCOUNTER — Encounter (HOSPITAL_BASED_OUTPATIENT_CLINIC_OR_DEPARTMENT_OTHER): Payer: Medicare Other | Admitting: General Surgery

## 2021-11-30 DIAGNOSIS — M8788 Other osteonecrosis, other site: Secondary | ICD-10-CM | POA: Diagnosis not present

## 2021-11-30 NOTE — Progress Notes (Addendum)
BREAWNA, MONTENEGRO ([id number]) Visit Report for 11/30/2021 HBO Details Patient Name: Date of Service: [name] 11/30/2021 1:00 PM Medical Record Number: [id number] Patient Account Number: [id number] Date of Birth/Sex: Treating RN: 1961-04-04 (61 y.o. 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, Jaw HBO Treatment Details Treatment Number: 17 Patient Type: Outpatient Chamber Type: Monoplace Chamber Serial #: [id number] Treatment Protocol: 2.5 ATA with 90 minutes oxygen, with two 5 minute air breaks Treatment Details Compression Rate Down: 2.0 psi / minute De-Compression Rate Up: 2.0 psi / minute A breaks and breathing ir Compress Tx Pressure periods Decompress Decompress Begins Reached (leave unused spaces Begins Ends blank) Chamber Pressure (ATA 1 2.5 2.5 2.5 2.5 2.5 - - 2.5 1 ) Clock Time (24 hr) 12:44 12:57 13:27 13:32 14:03 14:08 - - 14:38 14:49 Treatment Length: 125 (minutes) Treatment Segments: 4 Vital Signs Capillary Blood Glucose Reference Range: 80 - 120 mg / dl HBO Diabetic Blood Glucose Intervention Range: <131 mg/dl or >249 mg/dl Time Vitals Blood Respiratory Capillary Blood Glucose Pulse Action Type: Pulse: Temperature: Taken: Pressure: Rate: Glucose (mg/dl): Meter #: Oximetry (%) Taken: Pre 12:37 95/65 74 16 98.3 Post 14:51 998/70 65 16 98.2 Treatment Response Treatment Toleration: Well Treatment Completion Status: Treatment Completed without Adverse Event Physician HBO Attestation: I certify that I supervised this HBO treatment in accordance with Medicare guidelines. A trained emergency  response team is readily available per Yes hospital policies and procedures. Continue HBOT as ordered. Yes Electronic Signature(s) Signed: 11/30/2021 4:56:46 PM By: [name] MD FACS Previous Signature: 11/30/2021 3:15:38 PM Version By: [name] EMT Previous Signature: 11/30/2021 12:59:21 PM Version By: [name] EMT Entered By: [name] on 11/30/2021 16:56:45 -------------------------------------------------------------------------------- HBO Safety Checklist Details Patient Name: Date of Service: [name] 11/30/2021 1:00 PM Medical Record Number: [id number] Patient Account Number: [id number] Date of Birth/Sex: Treating RN: September 02, 1960 (61 y.o. [name] Primary Care Aislyn Hayse: [name] Other Clinician: [name] Referring Marcellous Snarski: Treating Laruen Risser/Extender: [name] Weeks in Treatment: 3 HBO Safety Checklist Items Safety Checklist Consent Form Signed Patient voided / foley secured and emptied When did you last eato 1030 Last dose of injectable or oral agent NA Ostomy pouch emptied and vented if applicable NA All implantable devices assessed, documented and approved Power Port Intravenous access site secured and place Power [employer] blend (less than 51% polyester) Personal oil-based products / skin lotions / body lotions removed Wigs or hairpieces removed NA Smoking or tobacco materials removed Books / newspapers / magazines / loose paper removed Cologne, aftershave, perfume and deodorant removed Jewelry removed (may wrap wedding band) NA Make-up removed Hair care products removed Battery operated devices (external) removed Heating patches and chemical warmers removed Titanium eyewear removed NA Nail polish cured greater than 10 hours 11/24/2021 Casting material cured greater than 10 hours NA Hearing aids removed NA Loose dentures or  partials removed NA Prosthetics have been removed NA Patient demonstrates correct use of air break device (if applicable) Patient concerns have been addressed Patient grounding bracelet on and cord attached to chamber Specifics for Inpatients (complete in addition to above) Medication sheet sent with patient NA  Intravenous medications needed or due during therapy sent with patient NA Drainage tubes (e.g. nasogastric tube or chest tube secured and vented) NA Endotracheal or Tracheotomy tube secured NA Cuff deflated of air and inflated with saline NA Airway suctioned NA Notes Safety checklist was done before treatment started. Electronic Signature(s) Signed: 11/30/2021 12:57:59 PM By: [name] EMT Entered By: [name] on 11/30/2021 12:57:58

## 2021-11-30 NOTE — Progress Notes (Signed)
JAZMON, KOS ([id number]) Visit Report for 11/30/2021 Problem List Details Patient Name: Date of Service: [name] 11/30/2021 1:00 PM Medical Record Number: [id number] Patient Account Number: [id number] Date of Birth/Sex: Treating RN: 1960/09/21 (61 y.o. 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 11/30/2021 Medical Record Number: [id number] Patient Account Number: [id number] Date of Birth/Sex: Treating RN: 1960-07-19 (61 y.o. 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

## 2021-11-30 NOTE — Progress Notes (Signed)
[name], [name] ([id number]) Visit Report for 11/30/2021 Arrival Information Details Patient Name: Date of Service: [name] 11/30/2021 1:00 PM Medical Record Number: [id number] Patient Account Number: [id number] Date of Birth/Sex: Treating RN: 02-09-1961 (61 y.o. [name] Primary Care [name]: [name] Other Clinician: [name] Referring [name]: Treating [name]/Extender: [name] in Treatment: 3 Visit Information History Since Last Visit All ordered tests and consults were completed: Yes Patient Arrived: Ambulatory Added or deleted any medications: No Arrival Time: 12:31 Any new allergies or adverse reactions: No Accompanied By: None Had a fall or experienced change in No Transfer Assistance: None activities of daily living that may affect Patient Identification Verified: Yes risk of falls: Secondary Verification Process Completed: Yes Signs or symptoms of abuse/neglect since last visito No Patient Requires Transmission-Based Precautions: No Hospitalized since last visit: No Patient Has Alerts: No Implantable device outside of the clinic excluding No cellular tissue based products placed in the center since last visit: Pain Present Now: No Electronic Signature(s) Signed: 11/30/2021 12:55:24 PM By: [name] EMT Entered By: [name] on 11/30/2021 12:55:24 -------------------------------------------------------------------------------- Vitals Details Patient Name: Date of Service: [name] 11/30/2021 1:00 PM Medical Record Number: [id number] Patient Account Number: [id number] Date of Birth/Sex: Treating RN: 1960/07/06 (61 y.o. [name] Primary Care [name]: [name] Other Clinician: [name] Referring [name]: Treating [name]/Extender: [name] Weeks in Treatment: 3 Vital Signs Time Taken: 12:37 Temperature (F):  98.3 Height (in): 60 Pulse (bpm): 74 Weight (lbs): 88 Respiratory Rate (breaths/min): 16 Body Mass Index (BMI): 17.2 Blood Pressure (mmHg): 95/65 Reference Range: 80 - 120 mg / dl Notes Dr. Celine Dillon informed of the patient's blood pressure. Electronic Signature(s) Signed: 11/30/2021 12:56:21 PM By: [name] EMT Entered By: [name] on 11/30/2021 12:56:21

## 2021-12-03 ENCOUNTER — Encounter (HOSPITAL_BASED_OUTPATIENT_CLINIC_OR_DEPARTMENT_OTHER): Payer: Medicare Other | Admitting: General Surgery

## 2021-12-04 ENCOUNTER — Encounter (HOSPITAL_BASED_OUTPATIENT_CLINIC_OR_DEPARTMENT_OTHER): Payer: Medicare Other | Admitting: General Surgery

## 2021-12-04 DIAGNOSIS — M8788 Other osteonecrosis, other site: Secondary | ICD-10-CM | POA: Diagnosis not present

## 2021-12-04 NOTE — Progress Notes (Signed)
[name], [name] ([id number]) Visit Report for 12/04/2021 HBO Details Patient Name: Date of Service: [name] 12/04/2021 1:00 PM Medical Record Number: [id number] Patient Account Number: [id number] Date of Birth/Sex: Treating RN: 08/22/1960 (61 y.o. 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, Jaw HBO Treatment Details Treatment Number: 18 Patient Type: Outpatient Chamber Type: Monoplace Chamber Serial #: [id number] Treatment Protocol: 2.5 ATA with 90 minutes oxygen, with two 5 minute air breaks Treatment Details Compression Rate Down: 2.0 psi / minute De-Compression Rate Up: 2.0 psi / minute A breaks and breathing ir Compress Tx Pressure periods Decompress Decompress Begins Reached (leave unused spaces Begins Ends blank) Chamber Pressure (ATA 1 2.5 2.5 2.5 2.5 2.5 - - 2.5 1 ) Clock Time (24 hr) 13:00 13:12 13:42 13:48 14:18 14:23 - - 14:53 15:04 Treatment Length: 124 (minutes) Treatment Segments: 4 Vital Signs Capillary Blood Glucose Reference Range: 80 - 120 mg / dl HBO Diabetic Blood Glucose Intervention Range: <131 mg/dl or >249 mg/dl Time Vitals Blood Respiratory Capillary Blood Glucose Pulse Action Type: Pulse: Temperature: Taken: Pressure: Rate: Glucose (mg/dl): Meter #: Oximetry (%) Taken: Pre 12:54 98/67 77 16 98.3 Post 15:07 100/61 56 16 98.2 Treatment Response Treatment Toleration: Well Treatment Completion Status: Treatment Completed without Adverse Event Physician HBO Attestation: I certify that I supervised this HBO treatment in accordance with Medicare guidelines. A trained emergency  response team is readily available per Yes hospital policies and procedures. Continue HBOT as ordered. Yes Electronic Signature(s) Signed: 12/04/2021 5:00:12 PM By: [name] MD FACS Previous Signature: 12/04/2021 4:33:29 PM Version By: [name] EMT Entered By: [name] on 12/04/2021 17:00:12 -------------------------------------------------------------------------------- HBO Safety Checklist Details Patient Name: Date of Service: [name] 12/04/2021 1:00 PM Medical Record Number: [id number] Patient Account Number: [id number] Date of Birth/Sex: Treating RN: 1961-01-04 (61 y.o. [name] Primary Care Leiah Giannotti: [name] Other Clinician: [name] Referring Lynnetta Tom: Treating Cedra Villalon/Extender: [name] Weeks in Treatment: 4 HBO Safety Checklist Items Safety Checklist Consent Form Signed Patient voided / foley secured and emptied When did you last eato 0830 Last dose of injectable or oral agent NA Ostomy pouch emptied and vented if applicable NA All implantable devices assessed, documented and approved Power Port Intravenous access site secured and place Power [employer] blend (less than 51% polyester) Personal oil-based products / skin lotions / body lotions removed Wigs or hairpieces removed NA Smoking or tobacco materials removed Books / newspapers / magazines / loose paper removed Cologne, aftershave, perfume and deodorant removed Jewelry removed (may wrap wedding band) NA Make-up removed Hair care products removed Battery operated devices (external) removed Heating patches and chemical warmers removed Titanium eyewear removed NA Nail polish cured greater than 10 hours 12/01/2021 Casting material cured greater than 10 hours NA Hearing aids removed NA Loose dentures or partials removed NA Prosthetics have been removed NA Patient  demonstrates correct use of air break device (if applicable) Patient concerns have been addressed Patient grounding bracelet on and cord attached to chamber Specifics for Inpatients (complete in addition to above) Medication sheet sent with patient NA Intravenous medications needed or due during therapy sent with patient  NA Drainage tubes (e.g. nasogastric tube or chest tube secured and vented) NA Endotracheal or Tracheotomy tube secured NA Cuff deflated of air and inflated with saline NA Airway suctioned NA Notes Safety checklist was done before treatment started. Electronic Signature(s) Signed: 12/04/2021 4:42:30 PM By: [name] EMT Previous Signature: 12/04/2021 4:32:19 PM Version By: [name] EMT Entered By: [name] on 12/04/2021 16:42:29

## 2021-12-04 NOTE — Progress Notes (Signed)
[name], [name] ([id number]) Visit Report for 12/04/2021 Arrival Information Details Patient Name: Date of Service: [name] 12/04/2021 1:00 PM Medical Record Number: [id number] Patient Account Number: [id number] Date of Birth/Sex: Treating RN: 07-19-1960 (61 y.o. [name] Primary Care Keisuke Hollabaugh: [name] Other Clinician: [name] Referring Rebeccah Ivins: Treating Ashe Graybeal/Extender: Mathews Argyle in Treatment: 4 Visit Information History Since Last Visit All ordered tests and consults were completed: Yes Patient Arrived: Ambulatory Added or deleted any medications: No Arrival Time: 12:34 Any new allergies or adverse reactions: No Accompanied By: None Had a fall or experienced change in No Transfer Assistance: None activities of daily living that may affect Patient Identification Verified: Yes risk of falls: Secondary Verification Process Completed: Yes Signs or symptoms of abuse/neglect since last visito No Patient Requires Transmission-Based Precautions: No Hospitalized since last visit: No Patient Has Alerts: No Implantable device outside of the clinic excluding No cellular tissue based products placed in the center since last visit: Pain Present Now: No Electronic Signature(s) Signed: 12/04/2021 4:30:16 PM By: [name] EMT Entered By: [name] on 12/04/2021 16:30:16 -------------------------------------------------------------------------------- Encounter Discharge Information Details Patient Name: Date of Service: [name] 12/04/2021 1:00 PM Medical Record Number: [id number] Patient Account Number: [id number] Date of Birth/Sex: Treating RN: 1960-12-18 (61 y.o. [name] Primary Care Dailee Manalang: [name] Other Clinician: [name] Referring Zorah Backes: Treating Kamilah Correia/Extender: Mathews Argyle in Treatment: 4 Encounter Discharge Information  Items Discharge Condition: Stable Ambulatory Status: Ambulatory Discharge Destination: Home Transportation: Private Auto Accompanied By: None Schedule Follow-up Appointment: No Clinical Summary of Care: Electronic Signature(s) Signed: 12/04/2021 4:37:27 PM By: [name] EMT Entered By: [name] on 12/04/2021 16:37:27 -------------------------------------------------------------------------------- Vitals Details Patient Name: Date of Service: [name] 12/04/2021 1:00 PM Medical Record Number: [id number] Patient Account Number: [id number] Date of Birth/Sex: Treating RN: September 10, 1960 (61 y.o. [name] Primary Care Wilber Fini: [name] Other Clinician: [name] Referring Fateh Kindle: Treating Kimesha Claxton/Extender: [name] Weeks in Treatment: 4 Vital Signs Time Taken: 12:54 Temperature (F): 98.3 Height (in): 60 Pulse (bpm): 77 Weight (lbs): 88 Respiratory Rate (breaths/min): 16 Body Mass Index (BMI): 17.2 Blood Pressure (mmHg): 98/67 Reference Range: 80 - 120 mg / dl Notes Dr. Celine Ahr informed of the patient's blood pressure. Electronic Signature(s) Signed: 12/04/2021 4:30:57 PM By: [name] EMT Entered By: [name] on 12/04/2021 16:30:57

## 2021-12-04 NOTE — Progress Notes (Signed)
AMERAH, PULEO ([id number]) Visit Report for 12/04/2021 Problem List Details Patient Name: Date of Service: [name] 12/04/2021 1:00 PM Medical Record Number: [id number] Patient Account Number: [id number] Date of Birth/Sex: Treating RN: Oct 30, 1960 (61 y.o. 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 12/04/2021 Medical Record Number: [id number] Patient Account Number: [id number] Date of Birth/Sex: Treating RN: 1961/04/20 (61 y.o. 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

## 2021-12-05 ENCOUNTER — Encounter (HOSPITAL_BASED_OUTPATIENT_CLINIC_OR_DEPARTMENT_OTHER): Payer: Medicare Other | Admitting: General Surgery

## 2021-12-05 DIAGNOSIS — M8788 Other osteonecrosis, other site: Secondary | ICD-10-CM | POA: Diagnosis not present

## 2021-12-05 NOTE — Progress Notes (Signed)
Theresa, Dillon ([id number]) Visit Report for 12/05/2021 Arrival Information Details Patient Name: Date of Service: [name] 12/05/2021 11:30 A M Medical Record Number: [id number] Patient Account Number: [id number] Date of Birth/Sex: Treating RN: 1960/07/20 (61 y.o. [name], [name] Primary Care Siedah Sedor: [name] Other Clinician: [name] Referring Patrisha Hausmann: Treating Tagen Milby/Extender: Mathews Argyle in Treatment: 4 Visit Information History Since Last Visit All ordered tests and consults were completed: Yes Patient Arrived: Ambulatory Added or deleted any medications: No Arrival Time: 10:40 Any new allergies or adverse reactions: No Accompanied By: None Had a fall or experienced change in No Transfer Assistance: None activities of daily living that may affect Patient Identification Verified: Yes risk of falls: Secondary Verification Process Completed: Yes Signs or symptoms of abuse/neglect since last visito No Patient Requires Transmission-Based Precautions: No Hospitalized since last visit: No Patient Has Alerts: No Implantable device outside of the clinic excluding No cellular tissue based products placed in the center since last visit: Pain Present Now: No Notes The patient had a clinic visit, before her treatment today. Electronic Signature(s) Signed: 12/05/2021 4:27:41 PM By: [name] EMT Entered By: [name] on 12/05/2021 16:27:40 -------------------------------------------------------------------------------- Encounter Discharge Information Details Patient Name: Date of Service: [name] 12/05/2021 11:30 A M Medical Record Number: [id number] Patient Account Number: [id number] Date of Birth/Sex: Treating RN: 03/06/1961 (61 y.o. [name] Primary Care Ahsan Esterline: [name] Other Clinician: [name] Referring Tim Corriher: Treating Rudolf Blizard/Extender: Mathews Argyle in Treatment: 4 Encounter Discharge Information Items Discharge Condition: Stable Ambulatory Status: Ambulatory Discharge Destination: Home Transportation: Private Auto Accompanied By: None Schedule Follow-up Appointment: Yes Clinical Summary of Care: Electronic Signature(s) Signed: 12/05/2021 4:32:01 PM By: [name] EMT Entered By: [name] on 12/05/2021 16:32:01 -------------------------------------------------------------------------------- Vitals Details Patient Name: Date of Service: [name] 12/05/2021 11:30 A M Medical Record Number: [id number] Patient Account Number: [id number] Date of Birth/Sex: Treating RN: 23-Jun-1961 (61 y.o. [name] Primary Care [name]: [name] Other Clinician: [name] Referring [name]: Treating Makensie Mulhall/Extender: [name] Weeks in Treatment: 4 Vital Signs Time Taken: 11:42 Temperature (F): 97.9 Height (in): 60 Pulse (bpm): 70 Weight (lbs): 88 Respiratory Rate (breaths/min): 16 Body Mass Index (BMI): 17.2 Blood Pressure (mmHg): 99/66 Reference Range: 80 - 120 mg / dl Electronic Signature(s) Signed: 12/05/2021 4:28:17 PM By: [name] EMT Entered By: [name] on 12/05/2021 16:28:17

## 2021-12-05 NOTE — Progress Notes (Signed)
DREAMER, CARILLO ([id number]) Visit Report for 12/05/2021 Problem List Details Patient Name: Date of Service: [name] 12/05/2021 11:30 A M Medical Record Number: [id number] Patient Account Number: [id number] Date of Birth/Sex: Treating RN: 12/12/1960 (61 y.o. 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 12/05/2021 Medical Record Number: [id number] Patient Account Number: [id number] Date of Birth/Sex: Treating RN: 06-17-1961 (61 y.o. 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

## 2021-12-05 NOTE — Progress Notes (Signed)
[name], [name] ([id number]) Visit Report for 12/05/2021 Chief Complaint Document Details Patient Name: Date of Service: Theresa LLA Theresa Alexander D. 12/05/2021 10:45 A M Medical Record Number: [id number] Patient Account Number: [id number] Date of Birth/Sex: Treating RN: 1961-05-11 (61 y.o. 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 12/05/2021 10:45 A M Medical Record Number: [id number] Patient Account Number: [id number] Date of Birth/Sex: Treating RN: March 18, 1961 (61 y.o. 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 She underwent resection with a fibular free flap in followed by a chin implant and tissue rearrangement at [facility] in June 2021. She underwent chemotherapy with cisplatin, completed in September 2021. She received radiation therapy of 66 Gray in 33 fractions which was completed in September 2021. She also previously had resection of gingival and tongue cancer with a marginal mandibulectomy. This was back in 2012. More recently, an attempt at maxillary dental implants was  made. During the course of this, it was hypothesized that she had some transient bacteremia which seeded her titanium implant. This subsequently eroded through her skin and she has a draining sinus tract. She is followed by otolaryngology at [facility]. They felt that her best chance of healing the wound and preserving her implant, specifically in the setting of radiation therapy would be a prolonged course of IV antibiotics as well as hyperbaric oxygen therapy. She was initially seen by the [facility] wound care clinic but expressed a preference to come here. Fortunately, the majority of the work-up was done by Dr. Con Memos. This included an EKG and chest x-ray. She does have a Port-A-Cath and will be initiating IV antibiotics today for 6-week course of ertapenem. She has been Referred to undergo hyperbaric oxygen therapy at our center. 12/05/2021: 30-day interval follow-up. She says that she thinks she has oral thrush from her IV antibiotics and is also complaining of a vaginal yeast infection. She has been tolerating hyperbaric oxygen therapy without difficulty. She was supposed to have posts placed for dental implants earlier this week, but the oral surgeon felt like the bone was not in good enough condition. The draining sinus beneath her mandible does not seem to be putting out much. Electronic Signature(s) Signed: 12/05/2021 11:37:36 AM By: [name] MD FACS Entered By: [name] on 12/05/2021 11:37:36 -------------------------------------------------------------------------------- Physical Exam Details Patient Name: Date of Service: Theresa LLA Theresa Alexander D. 12/05/2021 10:45 A M Medical Record Number: [id number] Patient Account Number: [id number] Date of Birth/Sex: Treating RN: 1960-07-09 (61 y.o. [name] Primary Care Provider: [name] Other Clinician: Referring Provider: Treating Provider/Extender: [name] Weeks in Treatment:  4 Constitutional Within normal range for this patient. . . . No acute distress.. Ears, Nose, Mouth, and Throat White plaques consistent with oral thrush. Respiratory Normal work of breathing on room air.. Electronic Signature(s) Signed: 12/05/2021  11:38:22 AM By: [name] MD FACS Entered By: [name] on 12/05/2021 11:38:22 -------------------------------------------------------------------------------- Physician Orders Details Patient Name: Date of Service: Theresa LLA Theresa Alexander D. 12/05/2021 10:45 A M Medical Record Number: [id number] Patient Account Number: [id number] Date of Birth/Sex: Treating RN: 04-22-1961 (61 y.o. 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, unspecified Follow-up Appointments ppointment in: - 30 days Return A Hyperbaric Oxygen Therapy Evaluate for HBO Therapy Indication: - Osteoradionecrosis of jaw If appropriate for treatment, begin HBOT per protocol: 2.5 ATA for 90 Minutes with 2 Five (5) Minute A Breaks ir Total Number of Treatments: - 40 One treatments per day (delivered Monday through Friday unless otherwise specified in Special Instructions below): A frin (Oxymetazoline HCL) 0.05% nasal spray - 1 spray in both nostrils daily as needed prior to HBO treatment for difficulty clearing ears Patient Medications llergies: codeine, doxycycline HCl, Sulfa (Sulfonamide Antibiotics) A Notifications Medication Indication Start End 12/05/2021 nystatin DOSE oral 100,000 unit/mL suspension - Use 5 mL and swish in mouth 4 times daily. Retain in  mouth as long as possible. Do not swallow. 12/05/2021 Diflucan DOSE oral 150 mg tablet - 1 tab p.o. every 72 hours x2 doses. Electronic Signature(s) Signed: 12/05/2021 11:44:53 AM By: [name] MD FACS Entered By: [name] on 12/05/2021 11:44:52 -------------------------------------------------------------------------------- Problem List Details Patient Name: Date of Service: Theresa LLA Theresa Alexander D. 12/05/2021 10:45 A M Medical Record Number: [id number] Patient Account Number: [id number] Date of Birth/Sex: Treating RN: 1961/03/05 (61 y.o. 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 12/05/2021 10:45 A M Medical Record Number: [id number] Patient Account Number: [id number] Date of Birth/Sex: Treating RN: 1960/11/08 (61 y.o. 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 She underwent resection with a fibular free flap in followed by a chin implant and tissue rearrangement at [facility] in June 2021. She underwent chemotherapy with cisplatin, completed in September 2021. She received radiation therapy of 66 Gray in 33 fractions which was completed in September 2021. She also previously had resection of gingival and tongue cancer with a marginal mandibulectomy. This was back in 2012. More recently, an attempt at maxillary dental implants was made. During the  course of this, it was hypothesized that she had some transient bacteremia which seeded her titanium implant. This subsequently eroded through her skin and she has a draining sinus tract. She is followed by otolaryngology at [facility]. They felt that her best chance of healing the wound and preserving her implant, specifically in the setting of radiation therapy would be a prolonged course of IV antibiotics as well as hyperbaric oxygen therapy. She was initially seen by the [facility] wound care clinic but expressed a preference to come here. Fortunately, the majority of the work-up was done by Dr. Con Memos. This included an EKG and chest x-ray. She does have a Port-A-Cath and will be initiating IV antibiotics today for 6-week course of ertapenem. She has been Referred to undergo hyperbaric oxygen therapy at our center. 12/05/2021: 30-day interval follow-up. She says that she thinks she has oral thrush from her IV antibiotics and is also complaining of a vaginal yeast infection. She has been tolerating hyperbaric oxygen therapy without difficulty. She was supposed to have posts placed for dental implants earlier this week, but the oral surgeon felt like the bone was not in good enough condition. The draining  sinus beneath her mandible does not seem to be putting out much. Patient History Family History Unknown History. Social History Former smoker - quit 2014, Marital Status - Married, Alcohol Use - Never, Drug Use - No History, Caffeine Use - Never. Medical History Oncologic Patient has history of Received Chemotherapy - 2021, Received Radiation - 33 tx 2021 Medical A Surgical History Notes nd Oncologic [facility] Psychiatric Anxiety, Major Depression Objective Constitutional Within normal range for this patient. No acute distress.. Vitals Time Taken: 10:45 AM, Height: 60 in, Weight: 88 lbs, BMI: 17.2, Temperature: 98.1 F, Pulse: 73 bpm, Respiratory Rate: 16 breaths/min, Blood Pressure: 87/59 mmHg. Ears, Nose, Mouth, and Throat White plaques consistent with oral thrush. Respiratory Normal work of breathing on room air.. 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, unspecified Plan Follow-up Appointments: Return Appointment in: - 30 days Hyperbaric Oxygen Therapy: Evaluate for HBO Therapy Indication: - Osteoradionecrosis of jaw If appropriate for treatment, begin HBOT per protocol: 2.5 ATA for 90 Minutes with 2 Five (5) Minute Air Breaks T Number of Treatments: - 40 otal One treatments per day (delivered Monday through Friday unless otherwise specified in Special Instructions below): Afrin (Oxymetazoline HCL) 0.05% nasal spray - 1 spray in both nostrils daily as needed prior to HBO treatment for difficulty clearing ears The following medication(s) was prescribed: nystatin oral 100,000 unit/mL suspension Use 5 mL and swish in mouth 4 times daily. Retain in mouth as long as possible. Do not swallow. starting 12/05/2021 Diflucan oral 150 mg tablet 1 tab p.o. every 72 hours x2 doses. starting 12/05/2021 12/05/2021: Patient being seen for  30-day interval during hyperbaric oxygen therapy. She was supposed to have an oral surgical procedure this week but the surgeon felt like the bone was not in good enough condition indicating persistence of her osteoradionecrosis. She is also developed oral thrush and vaginal candidiasis from her ertapenem. She continues to tolerate hyperbaric oxygen therapy without difficulty. She is scheduled to complete a total of 40 treatments. I will reevaluate in 30 days and consider extending this, pending further evaluation by her oral surgeon and oncologist. I have prescribed nystatin swish and spit as well as Diflucan to address her fungal overgrowth issues. Continue HBOT .  Electronic Signature(s) Signed: 12/05/2021 11:46:29 AM By: [name] MD FACS Entered By: [name] on 12/05/2021 11:46:29 -------------------------------------------------------------------------------- HxROS Details Patient Name: Date of Service: Theresa LLA Theresa Alexander D. 12/05/2021 10:45 A M Medical Record Number: [id number] Patient Account Number: [id number] Date of Birth/Sex: Treating RN: [birth year] (61 y.o. [name] Primary Care Provider: [name] Other Clinician: Referring Provider: Treating Provider/Extender: Mathews Argyle in Treatment: 4 Oncologic Medical History: Positive for: Received Chemotherapy - 2021; Received Radiation - 33 tx 2021 Past Medical History Notes: [facility] Medical History: Past Medical History Notes: Anxiety, Major Depression Immunizations Pneumococcal Vaccine: Received Pneumococcal Vaccination: No Implantable Devices None Family and Social History Unknown History: Yes; Former smoker - quit 2014; Marital Status - Married; Alcohol Use: Never; Drug Use: No History; Caffeine Use: Never; Financial Concerns: No; Food, Clothing or Shelter Needs: No; Support System Lacking: No; Transportation Concerns: No Electronic  Signature(s) Signed: 12/05/2021 2:37:03 PM By: [name] MD FACS Signed: 12/05/2021 4:55:42 PM By: Adline Peals Entered By: [name] on 12/05/2021 11:37:40 -------------------------------------------------------------------------------- SuperBill Details Patient Name: Date of Service: Theresa LLA Theresa Alexander D. 12/05/2021 Medical Record Number: [id number] Patient Account Number: [id number] Date of Birth/Sex: Treating RN: [birth year] (61 y.o. 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

## 2021-12-05 NOTE — Progress Notes (Signed)
[name], [name] ([id number]) Visit Report for 12/05/2021 Arrival Information Details Patient Name: Date of Service: [name] 12/05/2021 10:45 A M Medical Record Number: [id number] Patient Account Number: [id number] Date of Birth/Sex: Treating RN: 1960-08-15 (61 y.o. [name] Primary Care [name]: [name] Other Clinician: Referring [name]: Treating [name]/Extender: [name] in Treatment: 4 Visit Information History Since Last Visit Added or deleted any medications: No Patient Arrived: Ambulatory Any new allergies or adverse reactions: No Arrival Time: 10:53 Had a fall or experienced change in No Accompanied By: self activities of daily living that may affect Transfer Assistance: None risk of falls: Patient Identification Verified: Yes Signs or symptoms of abuse/neglect since last visito No Secondary Verification Process Completed: Yes Hospitalized since last visit: No Patient Requires Transmission-Based Precautions: No Implantable device outside of the clinic excluding No Patient Has Alerts: No cellular tissue based products placed in the center since last visit: Pain Present Now: No Electronic Signature(s) Signed: 12/05/2021 4:55:42 PM By: [name] Entered By: [name] on 12/05/2021 10:54:03 -------------------------------------------------------------------------------- Clinic Level of Care Assessment Details Patient Name: Date of Service: [name] 12/05/2021 10:45 A M Medical Record Number: [id number] Patient Account Number: [id number] Date of Birth/Sex: Treating RN: 1960/10/18 (61 y.o. [name] Primary Care Kristiana Jacko: [name] Other Clinician: Referring [name] [name]: Treating Molly Savarino/Extender: [name] in Treatment: 4 Clinic Level of Care Assessment Items TOOL 4 Quantity Score X- 1 0 Use when only an EandM is performed  on FOLLOW-UP visit ASSESSMENTS - Nursing Assessment / Reassessment X- 1 10 Reassessment of Co-morbidities (includes updates in patient status) '[]'$  - 0 Reassessment of Adherence to Treatment Plan ASSESSMENTS - Wound and Skin A ssessment / Reassessment '[]'$  - 0 Simple Wound Assessment / Reassessment - one wound '[]'$  - 0 Complex Wound Assessment / Reassessment - multiple wounds X- 1 10 Dermatologic / Skin Assessment (not related to wound area) ASSESSMENTS - Focused Assessment '[]'$  - 0 Circumferential Edema Measurements - multi extremities '[]'$  - 0 Nutritional Assessment / Counseling / Intervention '[]'$  - 0 Lower Extremity Assessment (monofilament, tuning fork, pulses) '[]'$  - 0 Peripheral Arterial Disease Assessment (using hand held doppler) ASSESSMENTS - Ostomy and/or Continence Assessment and Care '[]'$  - 0 Incontinence Assessment and Management '[]'$  - 0 Ostomy Care Assessment and Management (repouching, etc.) PROCESS - Coordination of Care '[]'$  - 0 Simple Patient / Family Education for ongoing care '[]'$  - 0 Complex (extensive) Patient / Family Education for ongoing care X- 1 10 Staff obtains Programmer, systems, Records, T Results / Process Orders est '[]'$  - 0 Staff telephones HHA, Nursing Homes / Clarify orders / etc '[]'$  - 0 Routine Transfer to another Facility (non-emergent condition) '[]'$  - 0 Routine Hospital Admission (non-emergent condition) '[]'$  - 0 New Admissions / Biomedical engineer / Ordering NPWT Apligraf, etc. , '[]'$  - 0 Emergency Hospital Admission (emergent condition) X- 1 10 Simple Discharge Coordination '[]'$  - 0 Complex (extensive) Discharge Coordination PROCESS - Special Needs '[]'$  - 0 Pediatric / Minor Patient Management '[]'$  - 0 Isolation Patient Management '[]'$  - 0 Hearing / Language / Visual special needs '[]'$  - 0 Assessment of Community assistance (transportation, D/C planning, etc.) '[]'$  - 0 Additional assistance / Altered mentation '[]'$  - 0 Support Surface(s) Assessment (bed,  cushion, seat, etc.) INTERVENTIONS - Wound Cleansing / Measurement '[]'$  - 0 Simple Wound Cleansing - one wound '[]'$  - 0 Complex Wound Cleansing - multiple wounds '[]'$  - 0 Wound  Imaging (photographs - any number of wounds) '[]'$  - 0 Wound Tracing (instead of photographs) '[]'$  - 0 Simple Wound Measurement - one wound '[]'$  - 0 Complex Wound Measurement - multiple wounds INTERVENTIONS - Wound Dressings '[]'$  - 0 Small Wound Dressing one or multiple wounds '[]'$  - 0 Medium Wound Dressing one or multiple wounds '[]'$  - 0 Large Wound Dressing one or multiple wounds '[]'$  - 0 Application of Medications - topical '[]'$  - 0 Application of Medications - injection INTERVENTIONS - Miscellaneous '[]'$  - 0 External ear exam '[]'$  - 0 Specimen Collection (cultures, biopsies, blood, body fluids, etc.) '[]'$  - 0 Specimen(s) / Culture(s) sent or taken to Lab for analysis '[]'$  - 0 Patient Transfer (multiple staff / Civil Service fast streamer / Similar devices) '[]'$  - 0 Simple Staple / Suture removal (25 or less) '[]'$  - 0 Complex Staple / Suture removal (26 or more) '[]'$  - 0 Hypo / Hyperglycemic Management (close monitor of Blood Glucose) '[]'$  - 0 Ankle / Brachial Index (ABI) - do not check if billed separately X- 1 5 Vital Signs Has the patient been seen at the hospital within the last three years: Yes Total Score: 45 Level Of Care: New/Established - Level 2 Electronic Signature(s) Signed: 12/05/2021 4:55:42 PM By: [name] Entered By: [name] on 12/05/2021 11:33:54 -------------------------------------------------------------------------------- Encounter Discharge Information Details Patient Name: Date of Service: [name] 12/05/2021 10:45 A M Medical Record Number: [id number] Patient Account Number: [id number] Date of Birth/Sex: Treating RN: 05-07-1961 (61 y.o. [name] Primary Care Lamere Lightner: [name] Other Clinician: Referring [employer]: Treating Deadra Diggins/Extender: [name] in Treatment: 4 Encounter Discharge Information Items Discharge Condition: Stable Ambulatory Status: Ambulatory Discharge Destination: Home Transportation: Private Auto Accompanied By: self Schedule Follow-up Appointment: Yes Clinical Summary of Care: Patient Declined Electronic Signature(s) Signed: 12/05/2021 4:55:42 PM By: Sabas Sous By: [name] on 12/05/2021 11:28:22 -------------------------------------------------------------------------------- Lower Extremity Assessment Details Patient Name: Date of Service: [name] Theresa [name] 12/05/2021 10:45 A M Medical Record Number: [id number] Patient Account Number: [id number] Date of Birth/Sex: Treating RN: 02-Jul-1960 (61 y.o. [name] Primary Care Alta Goding: [name] Other Clinician: Referring Emery Binz: Treating Emmanuel Gruenhagen/Extender: [name] Weeks in Treatment: 4 Electronic Signature(s) Signed: 12/05/2021 4:55:42 PM By: Sabas Sous By: [name] on 12/05/2021 10:54:45 -------------------------------------------------------------------------------- Multi Wound Chart Details Patient Name: Date of Service: [name] 12/05/2021 10:45 A M Medical Record Number: [id number] Patient Account Number: [id number] Date of Birth/Sex: Treating RN: [birth year] (61 y.o. [name] Primary Care Muad Noga: Other Clinician: Rollen Dillon Referring Annamarie Yamaguchi: Treating Ryon Layton/Extender: [name] in Treatment: 4 Vital Signs Height(in): 60 Pulse(bpm): 73 Weight(lbs): 88 Blood Pressure(mmHg): 87/59 Body Mass Index(BMI): 17.2 Temperature(F): 98.1 Respiratory Rate(breaths/min): 16 Wound Assessments Treatment Notes Electronic Signature(s) Signed: 12/05/2021 11:36:05 AM By: [name] MD FACS Signed: 12/05/2021 4:55:42 PM By: [name] Entered By: [name] on 12/05/2021 11:36:04 -------------------------------------------------------------------------------- Multi-Disciplinary Care Plan Details Patient Name: Date of Service: [name] 12/05/2021 10:45 A M Medical Record Number: [id number] Patient Account Number: [id number] Date of Birth/Sex: Treating RN: [birth year] (61 y.o. 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 procedures Discomfort related to temperature and humidity changes inside hyperbaric chamber Potential for barotraumas to ears, sinuses, teeth, and lungs or cerebral gas embolism related to changes in atmospheric pressure inside hyperbaric oxygen chamber Potential for oxygen toxicity seizures related to delivery of 100% oxygen at an increased atmospheric pressure Potential for pulmonary oxygen toxicity related to delivery of 100% oxygen at an increased atmospheric pressure Goals: Barotrauma will be prevented during HBO2 Date Initiated: 11/05/2021 T arget Resolution Date: 01/04/2022 Goal Status: Active Patient and/or family will be able to state/discuss factors appropriate to the management of their disease process during treatment Date Initiated: 11/05/2021 T arget Resolution Date: 01/04/2022 Goal Status: Active Patient will tolerate the hyperbaric oxygen therapy treatment Date Initiated: 11/05/2021 T arget Resolution Date: 01/04/2022 Goal Status: Active Patient will tolerate the internal climate of the chamber Date Initiated: 11/05/2021 T arget Resolution Date: 01/04/2022 Goal Status: Active Patient/caregiver will  verbalize understanding of HBO goals, rationale, procedures and potential hazards Date Initiated: 11/05/2021 T arget Resolution Date: 01/04/2022 Goal Status: Active Signs and symptoms of pulmonary oxygen toxicity will be recognized and promptly addressed Date Initiated: 11/05/2021 T arget Resolution Date: 01/04/2022 Goal Status: Active Signs and symptoms of seizure will be recognized and promptly addressed ; seizing patients will suffer no harm Date Initiated: 11/05/2021 Target Resolution Date: 01/04/2022 Goal Status: Active Interventions: Administer a five (5) minute air break for patient if signs and symptoms of seizure appear and notify the hyperbaric physician Administer the correct therapeutic gas delivery based on the patients needs and limitations, per physician order Assess and provide for patients comfort related to the hyperbaric environment and equalization of middle ear Assess for signs and symptoms related to adverse events, including but not limited to confinement anxiety, pneumothorax, oxygen toxicity and baurotrauma Assess patient for any history of confinement anxiety Assess patient's knowledge and expectations regarding hyperbaric medicine and provide education related to the hyperbaric environment, goals of treatment and prevention of adverse events Implement protocols to decrease risk of pneumothorax in high risk patients Notes: Electronic Signature(s) Signed: 12/05/2021 4:55:42 PM By: [name] Entered By: [name] on 12/05/2021 10:54:53 -------------------------------------------------------------------------------- Pain Assessment Details Patient Name: Date of Service: [name] 12/05/2021 10:45 A M Medical Record Number: [id number] Patient Account Number: [id number] Date of Birth/Sex: Treating RN: 25-Aug-1960 (61 y.o. [name] Primary Care Teoman Giraud: [name] Other Clinician: Referring Kailin Principato: Treating Novie Maggio/Extender:  [name] in Treatment: 4 Active Problems Location of Pain Severity and Description of Pain Patient Has Paino No Site Locations Rate the pain. Current Pain Level: 0 Pain Management and Medication Current Pain Management: Electronic Signature(s) Signed: 12/05/2021 4:55:42 PM By: [name] Entered By: [name] on 12/05/2021 10:54:41 -------------------------------------------------------------------------------- Patient/Caregiver Education Details Patient Name: Date of Service: [name] Theresa [name] 6/7/2023andnbsp10:45 A M Medical Record Number: [id number] Patient Account Number: [id number] Date of Birth/Gender: Treating RN: 01/10/1961 (61 y.o. [name] Primary Care Physician: [name] Other Clinician: Referring Physician: Treating Physician/Extender: [name] in Treatment: 4 Education Assessment Education Provided To: Patient Education Topics Provided Tissue Oxygenation: Methods: Explain/Verbal Responses: Reinforcements needed, State content correctly Electronic Signature(s) Signed: 12/05/2021 4:55:42 PM By: [name] Entered By: [name] on 12/05/2021 10:55:14 -------------------------------------------------------------------------------- [place] Details Patient Name: Date of Service: [name] 12/05/2021 10:45 A M Medical Record Number: [id number] Patient Account Number: [id number] Date of Birth/Sex: Treating RN: 09/24/1960 (60  y.o. [name] Primary Care Neven Fina: [name] Other Clinician: Referring Karigan Cloninger: Treating Jermesha Sottile/Extender: [name] in Treatment: 4 Vital Signs Time Taken: 10:45 Temperature (F): 98.1 Height (in): 60 Pulse (bpm): 73 Weight (lbs): 88 Respiratory Rate (breaths/min): 16 Body Mass Index (BMI): 17.2 Blood Pressure (mmHg): 87/59 Reference Range: 80 - 120  mg / dl Electronic Signature(s) Signed: 12/05/2021 4:55:42 PM By: [name] Entered By: [name] on 12/05/2021 10:54:33

## 2021-12-05 NOTE — Progress Notes (Signed)
JAKIYA, BOOKBINDER ([id number]) Visit Report for 12/05/2021 HBO Details Patient Name: Date of Service: [name] 12/05/2021 11:30 A M Medical Record Number: [id number] Patient Account Number: [id number] Date of Birth/Sex: Treating RN: May 02, 1961 (61 y.o. 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, Jaw HBO Treatment Details Treatment Number: 19 Patient Type: Outpatient Chamber Type: Monoplace Chamber Serial #: [id number] Treatment Protocol: 2.5 ATA with 90 minutes oxygen, with two 5 minute air breaks Treatment Details Compression Rate Down: 2.0 psi / minute De-Compression Rate Up: 2.0 psi / minute A breaks and breathing ir Compress Tx Pressure periods Decompress Decompress Begins Reached (leave unused spaces Begins Ends blank) Chamber Pressure (ATA 1 2.5 2.5 2.5 2.5 2.5 - - 2.5 1 ) Clock Time (24 hr) 11:47 12:02 12:32 12:37 13:08 13:13 - - 13:43 13:53 Treatment Length: 126 (minutes) Treatment Segments: 4 Vital Signs Capillary Blood Glucose Reference Range: 80 - 120 mg / dl HBO Diabetic Blood Glucose Intervention Range: <131 mg/dl or >249 mg/dl Time Vitals Blood Respiratory Capillary Blood Glucose Pulse Action Type: Pulse: Temperature: Taken: Pressure: Rate: Glucose (mg/dl): Meter #: Oximetry (%) Taken: Pre 11:42 99/66 70 16 97.9 Post 13:56 104/72 53 16 98.2 Treatment Response Treatment Toleration: Well Treatment Completion Status: Treatment Completed without Adverse Event Physician HBO Attestation: I certify that I supervised this HBO treatment in accordance with Medicare guidelines. A trained  emergency response team is readily available per Yes hospital policies and procedures. Continue HBOT as ordered. Yes Electronic Signature(s) Signed: 12/05/2021 4:36:51 PM By: [name] MD FACS Previous Signature: 12/05/2021 4:30:47 PM Version By: [name] EMT Entered By: [name] on 12/05/2021 16:36:50 -------------------------------------------------------------------------------- HBO Safety Checklist Details Patient Name: Date of Service: [name] 12/05/2021 11:30 A M Medical Record Number: [id number] Patient Account Number: [id number] Date of Birth/Sex: Treating RN: 1961-05-24 (61 y.o. [name] Primary Care Shereen Marton: [name] Other Clinician: [name] Referring Eimi Viney: Treating Deniqua Perry/Extender: [name] Weeks in Treatment: 4 HBO Safety Checklist Items Safety Checklist Consent Form Signed Patient voided / foley secured and emptied When did you last eato 0900 Last dose of injectable or oral agent NA Ostomy pouch emptied and vented if applicable NA All implantable devices assessed, documented and approved Power Port Intravenous access site secured and place Power [employer] blend (less than 51% polyester) Personal oil-based products / skin lotions / body lotions removed Wigs or hairpieces removed NA Smoking or tobacco materials removed Books / newspapers / magazines / loose paper removed Cologne, aftershave, perfume and deodorant removed Jewelry removed (may wrap wedding band) NA Make-up removed NA Hair care products removed Battery operated devices (external) removed Heating patches and chemical warmers removed Titanium eyewear removed NA Nail polish cured greater than 10 hours NA Casting material cured greater than 10 hours NA Hearing aids removed NA Loose dentures or partials removed NA Prosthetics have been removed NA Patient  demonstrates correct use of air break device (if applicable) Patient concerns have been addressed Patient grounding bracelet on and cord attached to chamber Specifics for Inpatients (complete in addition to above) Medication sheet sent with patient NA Intravenous medications needed or due during therapy  sent with patient NA Drainage tubes (e.g. nasogastric tube or chest tube secured and vented) NA Endotracheal or Tracheotomy tube secured NA Cuff deflated of air and inflated with saline NA Airway suctioned NA Notes Safety checklist was done before treatment started. Electronic Signature(s) Signed: 12/05/2021 4:34:31 PM By: [name] EMT Previous Signature: 12/05/2021 4:29:26 PM Version By: [name] EMT Entered By: [name] on 12/05/2021 16:34:31

## 2021-12-06 ENCOUNTER — Encounter (HOSPITAL_BASED_OUTPATIENT_CLINIC_OR_DEPARTMENT_OTHER): Payer: Medicare Other | Admitting: General Surgery

## 2021-12-06 DIAGNOSIS — M8788 Other osteonecrosis, other site: Secondary | ICD-10-CM | POA: Diagnosis not present

## 2021-12-06 NOTE — Progress Notes (Addendum)
[name], [name] ([id number]) Visit Report for 12/06/2021 HBO Details Patient Name: Date of Service: Theresa LLA Theresa Alexander D. 12/06/2021 1:00 PM Medical Record Number: [id number] Patient Account Number: [id number] Date of Birth/Sex: Treating RN: 10-16-1960 (61 y.o. 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, Jaw HBO Treatment Details Treatment Number: 20 Patient Type: Outpatient Chamber Type: Monoplace Chamber Serial #: [id number] Treatment Protocol: 2.5 ATA with 90 minutes oxygen, with two 5 minute air breaks Treatment Details Compression Rate Down: 2.0 psi / minute De-Compression Rate Up: 2.0 psi / minute A breaks and breathing ir Compress Tx Pressure periods Decompress Decompress Begins Reached (leave unused spaces Begins Ends blank) Chamber Pressure (ATA 1 2.5 2.5 2.5 2.5 2.5 - - 2.5 1 ) Clock Time (24 hr) 12:47 13:00 13:30 13:36 14:07 14:12 - - 14:47 14:51 Treatment Length: 124 (minutes) Treatment Segments: 4 Vital Signs Capillary Blood Glucose Reference Range: 80 - 120 mg / dl HBO Diabetic Blood Glucose Intervention Range: <131 mg/dl or >249 mg/dl Time Vitals Blood Respiratory Capillary Blood Glucose Pulse Action Type: Pulse: Temperature: Taken: Pressure: Rate: Glucose (mg/dl): Meter #: Oximetry (%) Taken: Pre 12:38 100/73 76 16 98.6 Post 14:55 108/69 49 14 98.4 Treatment Response Treatment Toleration: Well Treatment Completion Status: Treatment Completed without Adverse Event Physician HBO Attestation: I certify that I supervised this HBO treatment in accordance with Medicare guidelines. A trained  emergency response team is readily available per Yes hospital policies and procedures. Continue HBOT as ordered. Yes Electronic Signature(s) Signed: 12/06/2021 3:57:26 PM By: [name] MD FACS Previous Signature: 12/06/2021 3:34:18 PM Version By: [name] EMT Entered By: [name] on 12/06/2021 15:57:26 -------------------------------------------------------------------------------- HBO Safety Checklist Details Patient Name: Date of Service: Theresa LLA Theresa Alexander D. 12/06/2021 1:00 PM Medical Record Number: [id number] Patient Account Number: [id number] Date of Birth/Sex: Treating RN: October 17, 1960 (61 y.o. [name] Primary Care Lasaundra Riche: [name] Other Clinician: [name] Referring Josee Speece: Treating Jeanean Hollett/Extender: [name] Weeks in Treatment: 4 HBO Safety Checklist Items Safety Checklist Consent Form Signed Patient voided / foley secured and emptied When did you last eato 0900 Last dose of injectable or oral agent NA Ostomy pouch emptied and vented if applicable NA All implantable devices assessed, documented and approved Power Port Intravenous access site secured and place Power [employer] blend (less than 51% polyester) Personal oil-based products / skin lotions / body lotions removed Wigs or hairpieces removed NA Smoking or tobacco materials removed Books / newspapers / magazines / loose paper removed Cologne, aftershave, perfume and deodorant removed Jewelry removed (may wrap wedding band) NA Make-up removed Hair care products removed Battery operated devices (external) removed Heating patches and chemical warmers removed Titanium eyewear removed NA Nail polish cured greater than 10 hours NA Casting material cured greater than 10 hours NA Hearing aids removed NA Loose dentures or partials removed NA Prosthetics have been removed NA Patient  demonstrates correct use of air break device (if applicable) Patient concerns have been addressed Patient grounding bracelet on and cord attached to chamber Specifics for Inpatients (complete in addition to above) Medication sheet sent with patient NA Intravenous medications needed or due during therapy sent with patient  NA Drainage tubes (e.g. nasogastric tube or chest tube secured and vented) NA Endotracheal or Tracheotomy tube secured NA Cuff deflated of air and inflated with saline NA Airway suctioned NA Notes The safety checklist was done before treatment started. Electronic Signature(s) Signed: 12/06/2021 3:32:38 PM By: [name] EMT Entered By: [name] on 12/06/2021 15:32:38

## 2021-12-06 NOTE — Progress Notes (Signed)
KLAUDIA, BEIRNE ([id number]) Visit Report for 12/06/2021 Problem List Details Patient Name: Date of Service: Theresa LLA [name] 12/06/2021 1:00 PM Medical Record Number: [id number] Patient Account Number: [id number] Date of Birth/Sex: Treating RN: 30-Sep-1960 (61 y.o. 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 12/06/2021 Medical Record Number: [id number] Patient Account Number: [id number] Date of Birth/Sex: Treating RN: Mar 30, 1961 (61 y.o. 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

## 2021-12-06 NOTE — Progress Notes (Signed)
[name], [name] ([id number]) Visit Report for 12/06/2021 Arrival Information Details Patient Name: Date of Service: [name] 12/06/2021 1:00 PM Medical Record Number: [id number] Patient Account Number: [id number] Date of Birth/Sex: Treating RN: [name] (61 y.o. [name] Primary Care Anna Livers: [name] Other Clinician: [name] Referring Evita Merida: Treating Neenah Canter/Extender: Mathews Argyle in Treatment: 4 Visit Information History Since Last Visit All ordered tests and consults were completed: Yes Patient Arrived: Ambulatory Added or deleted any medications: No Arrival Time: 12:28 Any new allergies or adverse reactions: No Accompanied By: None Had a fall or experienced change in No Transfer Assistance: None activities of daily living that may affect Patient Identification Verified: Yes risk of falls: Secondary Verification Process Completed: Yes Signs or symptoms of abuse/neglect since last visito No Patient Requires Transmission-Based Precautions: No Hospitalized since last visit: No Patient Has Alerts: No Implantable device outside of the clinic excluding No cellular tissue based products placed in the center since last visit: Pain Present Now: No Electronic Signature(s) Signed: 12/06/2021 3:30:39 PM By: [name] EMT Entered By: [name] on 12/06/2021 15:30:39 -------------------------------------------------------------------------------- Encounter Discharge Information Details Patient Name: Date of Service: [name] Theresa [name] 12/06/2021 1:00 PM Medical Record Number: [id number] Patient Account Number: [id number] Date of Birth/Sex: Treating RN: [birth year] (61 y.o. [name] Primary Care Analicia Skibinski: [name] Other Clinician: [name] Referring Khyre Germond: Treating Rosamary Boudreau/Extender: Mathews Argyle in Treatment: 4 Encounter Discharge Information  Items Discharge Condition: Stable Ambulatory Status: Ambulatory Discharge Destination: Home Transportation: Private Auto Accompanied By: None Schedule Follow-up Appointment: Yes Clinical Summary of Care: Electronic Signature(s) Signed: 12/06/2021 3:35:27 PM By: [name] EMT Entered By: [name] on 12/06/2021 15:35:26 -------------------------------------------------------------------------------- Vitals Details Patient Name: Date of Service: [name] 12/06/2021 1:00 PM Medical Record Number: [id number] Patient Account Number: [id number] Date of Birth/Sex: Treating RN: [name] (61 y.o. [name] Primary Care Roxanna Mcever: [name] Other Clinician: [name] Referring Javeria Briski: Treating Jacqueline Delapena/Extender: [name] Weeks in Treatment: 4 Vital Signs Time Taken: 12:38 Temperature (F): 98.6 Height (in): 60 Pulse (bpm): 76 Weight (lbs): 88 Respiratory Rate (breaths/min): 16 Body Mass Index (BMI): 17.2 Blood Pressure (mmHg): 100/73 Reference Range: 80 - 120 mg / dl Electronic Signature(s) Signed: 12/06/2021 3:31:12 PM By: [name] EMT Entered By: [name] on 12/06/2021 15:31:12

## 2021-12-07 ENCOUNTER — Encounter (HOSPITAL_BASED_OUTPATIENT_CLINIC_OR_DEPARTMENT_OTHER): Payer: Medicare Other | Admitting: General Surgery

## 2021-12-07 DIAGNOSIS — M8788 Other osteonecrosis, other site: Secondary | ICD-10-CM | POA: Diagnosis not present

## 2021-12-07 NOTE — Progress Notes (Addendum)
Theresa, Dillon ([id number]) Visit Report for 12/07/2021 Arrival Information Details Patient Name: Date of Service: [name] 12/07/2021 1:00 PM Medical Record Number: [id number] Patient Account Number: [id number] Date of Birth/Sex: Treating RN: [birth year] (61 y.o. [name] Primary Care Alayia Meggison: [name] Other Clinician: [name] Referring Rossi Silvestro: Treating Calab Sachse/Extender: Mathews Argyle in Treatment: 4 Visit Information History Since Last Visit All ordered tests and consults were completed: Yes Patient Arrived: Ambulatory Added or deleted any medications: No Arrival Time: 12:35 Any new allergies or adverse reactions: No Accompanied By: None Had a fall or experienced change in No Transfer Assistance: None activities of daily living that may affect Patient Identification Verified: Yes risk of falls: Secondary Verification Process Completed: Yes Signs or symptoms of abuse/neglect since last visito No Patient Requires Transmission-Based Precautions: No Hospitalized since last visit: No Patient Has Alerts: No Implantable device outside of the clinic excluding No cellular tissue based products placed in the center since last visit: Pain Present Now: No Electronic Signature(s) Signed: 12/07/2021 1:42:02 PM By: [name] EMT Entered By: [name] on 12/07/2021 13:42:02 -------------------------------------------------------------------------------- Encounter Discharge Information Details Patient Name: Date of Service: [name] 12/07/2021 1:00 PM Medical Record Number: [id number] Patient Account Number: [id number] Date of Birth/Sex: Treating RN: [birth year] (61 y.o. [name] Primary Care Elfida Shimada: [name] Other Clinician: [name] Referring Willean Schurman: Treating Drenda Sobecki/Extender: Mathews Argyle in Treatment: 4 Encounter Discharge Information  Items Discharge Condition: Stable Ambulatory Status: Ambulatory Discharge Destination: Home Transportation: Private Auto Accompanied By: None Schedule Follow-up Appointment: Yes Clinical Summary of Care: Electronic Signature(s) Signed: 12/07/2021 3:18:58 PM By: [name] EMT Entered By: [name] on 12/07/2021 15:18:58 -------------------------------------------------------------------------------- Vitals Details Patient Name: Date of Service: [name] 12/07/2021 1:00 PM Medical Record Number: [id number] Patient Account Number: [id number] Date of Birth/Sex: Treating RN: [birth year] (61 y.o. [name] Primary Care [name]: [name] Other Clinician: [name] Referring Johny Pitstick: Treating Evangeline Utley/Extender: [name] Weeks in Treatment: 4 Vital Signs Time Taken: 12:57 Temperature (F): 98.3 Height (in): 60 Pulse (bpm): 68 Weight (lbs): 88 Respiratory Rate (breaths/min): 18 Body Mass Index (BMI): 17.2 Blood Pressure (mmHg): 92/71 Reference Range: 80 - 120 mg / dl Electronic Signature(s) Signed: 12/07/2021 1:43:49 PM By: [name] EMT Entered By: [name] on 12/07/2021 13:43:49

## 2021-12-07 NOTE — Progress Notes (Addendum)
MELLA, INCLAN ([id number]) Visit Report for 12/07/2021 HBO Details Patient Name: Date of Service: [name] 12/07/2021 1:00 PM Medical Record Number: [id number] Patient Account Number: [id number] Date of Birth/Sex: Treating RN: [birth year] (61 y.o. 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, Jaw HBO Treatment Details Treatment Number: 21 Patient Type: Outpatient Chamber Type: Monoplace Chamber Serial #: [id number] Treatment Protocol: 2.5 ATA with 90 minutes oxygen, with two 5 minute air breaks Treatment Details Compression Rate Down: 2.0 psi / minute De-Compression Rate Up: A breaks and breathing ir Compress Tx Pressure periods Decompress Decompress Begins Reached (leave unused spaces Begins Ends blank) Chamber Pressure (ATA 1 2.5 2.5 2.5 2.5 2.5 - - 2.5 1 ) Clock Time (24 hr) 13:00 13:12 13:42 13:47 14:17 14:22 - - 14:53 15:05 Treatment Length: 125 (minutes) Treatment Segments: 4 Vital Signs Capillary Blood Glucose Reference Range: 80 - 120 mg / dl HBO Diabetic Blood Glucose Intervention Range: <131 mg/dl or >249 mg/dl Time Vitals Blood Respiratory Capillary Blood Glucose Pulse Action Type: Pulse: Temperature: Taken: Pressure: Rate: Glucose (mg/dl): Meter #: Oximetry (%) Taken: Pre 12:57 92/71 68 18 98.3 Post 15:07 109/79 52 16 98 Treatment Response Treatment Toleration: Well Treatment Completion Status: Treatment Completed without Adverse Event Physician HBO Attestation: I certify that I supervised this HBO treatment in accordance with Medicare guidelines. A trained emergency response team is  readily available per Yes hospital policies and procedures. Continue HBOT as ordered. Yes Electronic Signature(s) Signed: 12/07/2021 3:42:58 PM By: [name] MD FACS Previous Signature: 12/07/2021 3:15:47 PM Version By: [name] EMT Previous Signature: 12/07/2021 2:24:29 PM Version By: [name] EMT Previous Signature: 12/07/2021 1:49:03 PM Version By: [name] EMT Entered By: [name] on 12/07/2021 15:42:58 -------------------------------------------------------------------------------- HBO Safety Checklist Details Patient Name: Date of Service: [name] 12/07/2021 1:00 PM Medical Record Number: [id number] Patient Account Number: [id number] Date of Birth/Sex: Treating RN: 02-08-1961 (61 y.o. [name] Primary Care Parisa Pinela: [name] Other Clinician: [name] Referring Dalonda Simoni: Treating Deke Tilghman/Extender: [name] Weeks in Treatment: 4 HBO Safety Checklist Items Safety Checklist Consent Form Signed Patient voided / foley secured and emptied When did you last eato 1000 Last dose of injectable or oral agent NA Ostomy pouch emptied and vented if applicable NA All implantable devices assessed, documented and approved Power Port Intravenous access site secured and place Power [employer] blend (less than 51% polyester) Personal oil-based products / skin lotions / body lotions removed Wigs or hairpieces removed NA Smoking or tobacco materials removed Books / newspapers / magazines / loose paper removed Cologne, aftershave, perfume and deodorant removed Jewelry removed (may wrap wedding band) NA Make-up removed Hair care products removed Battery operated devices (external) removed Heating patches and chemical warmers removed Titanium eyewear removed NA Nail polish cured greater than 10 hours 12/01/2021 Casting material cured greater than 10  hours NA Hearing aids removed NA Loose dentures or partials removed NA Prosthetics have been removed NA Patient demonstrates correct use of air break device (if applicable) Patient concerns have been addressed Patient grounding bracelet on and cord attached to chamber Specifics for Inpatients (complete in addition to above)  Medication sheet sent with patient NA Intravenous medications needed or due during therapy sent with patient NA Drainage tubes (e.g. nasogastric tube or chest tube secured and vented) NA Endotracheal or Tracheotomy tube secured NA Cuff deflated of air and inflated with saline NA Airway suctioned NA Notes The safety checklist was done before treatment started. Electronic Signature(s) Signed: 12/07/2021 1:45:58 PM By: [name] EMT Entered By: [name] on 12/07/2021 13:45:58

## 2021-12-07 NOTE — Progress Notes (Signed)
[name], [name] ([id number]) Visit Report for 12/07/2021 Problem List Details Patient Name: Date of Service: [name] 12/07/2021 1:00 PM Medical Record Number: [id number] Patient Account Number: [id number] Date of Birth/Sex: Treating RN: Dec 22, 1960 (60 y.o. 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 12/07/2021 Medical Record Number: [id number] Patient Account Number: [id number] Date of Birth/Sex: Treating RN: 1961/02/22 (61 y.o. 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

## 2021-12-10 ENCOUNTER — Encounter (HOSPITAL_BASED_OUTPATIENT_CLINIC_OR_DEPARTMENT_OTHER): Payer: Medicare Other | Admitting: Internal Medicine

## 2021-12-10 DIAGNOSIS — Z923 Personal history of irradiation: Secondary | ICD-10-CM

## 2021-12-10 DIAGNOSIS — M272 Inflammatory conditions of jaws: Secondary | ICD-10-CM

## 2021-12-10 DIAGNOSIS — C411 Malignant neoplasm of mandible: Secondary | ICD-10-CM

## 2021-12-10 DIAGNOSIS — M8788 Other osteonecrosis, other site: Secondary | ICD-10-CM

## 2021-12-10 NOTE — Progress Notes (Signed)
[name], [name] ([id number]) Visit Report for 12/10/2021 SuperBill Details Patient Name: Date of Service: [name] 12/10/2021 Medical Record Number: [id number] Patient Account Number: [id number] Date of Birth/Sex: Treating RN: 17-Nov-1960 (61 y.o. 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

## 2021-12-11 ENCOUNTER — Encounter (HOSPITAL_BASED_OUTPATIENT_CLINIC_OR_DEPARTMENT_OTHER): Payer: Medicare Other | Admitting: Internal Medicine

## 2021-12-12 ENCOUNTER — Encounter (HOSPITAL_BASED_OUTPATIENT_CLINIC_OR_DEPARTMENT_OTHER): Payer: Medicare Other | Admitting: General Surgery

## 2021-12-12 DIAGNOSIS — M8788 Other osteonecrosis, other site: Secondary | ICD-10-CM | POA: Diagnosis not present

## 2021-12-12 NOTE — Progress Notes (Signed)
[name], [name] ([id number]) Visit Report for 12/12/2021 Problem List Details Patient Name: Date of Service: [name] 12/12/2021 1:00 PM Medical Record Number: [id number] Patient Account Number: [id number] Date of Birth/Sex: Treating RN: 05-19-1961 (61 y.o. 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 12/12/2021 1:00 PM Medical Record Number: [id number] Patient Account Number: [id number] Date of Birth/Sex: Treating RN: Jul 08, 1960 (61 y.o. [name] Primary Care Provider: [name] Other Clinician: [name] Referring Provider: Treating Provider/Extender: Mathews Argyle in Treatment: 5 Oncologic Medical History: Positive for: Received Chemotherapy - 2021; Received Radiation - 33 tx 2021 Past Medical History Notes: [facility] Medical History: Past Medical History Notes: Anxiety, Major Depression Immunizations Pneumococcal Vaccine: Received Pneumococcal Vaccination: No Implantable Devices None Hospitalization / Surgery History Type of  Hospitalization/Surgery dental implants 12/11/2021 Family and Social History Unknown History: Yes; Former smoker - quit 2014; Marital Status - Married; Alcohol Use: Never; Drug Use: No History; Caffeine Use: Never; Financial Concerns: No; Food, Clothing or Shelter Needs: No; Support System Lacking: No; Transportation Concerns: No Electronic Signature(s) Signed: 12/12/2021 5:18:47 PM By: [name] MD FACS Signed: 12/12/2021 5:43:09 PM By: Baruch Gouty RN, BSN Entered By: Baruch Gouty on 12/12/2021 17:13:36 -------------------------------------------------------------------------------- SuperBill Details Patient Name: Date of Service: [name] 12/12/2021 Medical Record Number: [id number] Patient Account Number: [id number] Date of Birth/Sex: Treating RN: Sep 25, 1960 (61 y.o. 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

## 2021-12-12 NOTE — Progress Notes (Signed)
[name], [name] ([id number]) Visit Report for 12/12/2021 HBO Details Patient Name: Date of Service: Theresa LLA [name] 12/12/2021 1:00 PM Medical Record Number: [id number] Patient Account Number: [id number] Date of Birth/Sex: Treating RN: 1961-01-16 (61 y.o. 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, Jaw HBO Treatment Details Treatment Number: 23 Patient Type: Outpatient Chamber Type: Monoplace Chamber Serial #: [id number] Treatment Protocol: 2.5 ATA with 90 minutes oxygen, with two 5 minute air breaks Treatment Details Compression Rate Down: 2.0 psi / minute De-Compression Rate Up: 2.0 psi / minute A breaks and breathing ir Compress Tx Pressure periods Decompress Decompress Begins Reached (leave unused spaces Begins Ends blank) Chamber Pressure (ATA 1 2.5 2.5 2.5 2.5 2.5 - - 2.5 1 ) Clock Time (24 hr) 12:31 12:44 12:14 13:19 13:24 13:54 - - 14:25 14:34 Treatment Length: 123 (minutes) Treatment Segments: 4 Vital Signs Capillary Blood Glucose Reference Range: 80 - 120 mg / dl HBO Diabetic Blood Glucose Intervention Range: <131 mg/dl or >249 mg/dl Time Vitals Blood Respiratory Capillary Blood Glucose Pulse Action Type: Pulse: Temperature: Taken: Pressure: Rate: Glucose (mg/dl): Meter #: Oximetry (%) Taken: Pre 12:15 92/66 69 14 98.6 Post 14:36 89/76 56 14 98.2 Treatment Response Treatment Toleration: Well Treatment Completion Status: Treatment Completed without Adverse Event Physician HBO Attestation: I certify that I supervised this HBO treatment in accordance with Medicare guidelines. A trained  emergency response team is readily available per Yes hospital policies and procedures. Continue HBOT as ordered. Yes Electronic Signature(s) Signed: 12/12/2021 5:21:34 PM By: Theresa Maudlin MD FACS Previous Signature: 12/12/2021 5:16:23 PM Version By: [name] EMT Entered By: [name] on 12/12/2021 17:21:34 -------------------------------------------------------------------------------- HBO Safety Checklist Details Patient Name: Date of Service: Theresa LLA [name] 12/12/2021 1:00 PM Medical Record Number: [id number] Patient Account Number: [id number] Date of Birth/Sex: Treating RN: 1960/07/24 (61 y.o. [name], [name] Primary Care [name]: [name] Other Clinician: Valeria Dillon Referring [name]: Treating [name]/Extender: [name] Weeks in Treatment: 5 HBO Safety Checklist Items Safety Checklist Consent Form Signed Patient voided / foley secured and emptied When did you last eato 1000 Last dose of injectable or oral agent NA Ostomy pouch emptied and vented if applicable NA All implantable devices assessed, documented and approved Power Port Intravenous access site secured and place Power [employer] blend (less than 51% polyester) Personal oil-based products / skin lotions / body lotions removed Wigs or hairpieces removed NA Smoking or tobacco materials removed Books / newspapers / magazines / loose paper removed Cologne, aftershave, perfume and deodorant removed Jewelry removed (may wrap wedding band) NA Make-up removed Hair care products removed Battery operated devices (external) removed Heating patches and chemical warmers removed Titanium eyewear removed NA Nail polish cured greater than 10 hours NA Casting material cured greater than 10 hours NA Hearing aids removed NA Loose dentures or partials removed NA Prosthetics have been removed NA Patient  demonstrates correct use of air break device (if applicable) Patient concerns have been addressed Patient grounding bracelet on and cord attached to chamber Specifics for Inpatients (complete in addition to above) Medication sheet sent with patient NA Intravenous medications needed or due during therapy sent with patient  NA Drainage tubes (e.g. nasogastric tube or chest tube secured and vented) NA Endotracheal or Tracheotomy tube secured NA Cuff deflated of air and inflated with saline NA Airway suctioned NA Notes Safety checklist was done before treatment started. Electronic Signature(s) Signed: 12/12/2021 5:23:39 PM By: [name] EMT Previous Signature: 12/12/2021 5:11:18 PM Version By: [name] EMT Entered By: [name] on 12/12/2021 17:23:39

## 2021-12-12 NOTE — Progress Notes (Signed)
LIZBET, CIRRINCIONE ([id number]) Visit Report for 12/12/2021 Arrival Information Details Patient Name: Date of Service: [name] 12/12/2021 1:00 PM Medical Record Number: [id number] Patient Account Number: [id number] Date of Birth/Sex: Treating RN: [birth year] (61 y.o. [name], [name] Primary Care Eliezer Khawaja: [name] Other Clinician: [name] Referring Anallely Rosell: Treating Shashank Kwasnik/Extender: Mathews Argyle in Treatment: 5 Visit Information History Since Last Visit All ordered tests and consults were completed: Yes Patient Arrived: Ambulatory Added or deleted any medications: No Arrival Time: 12:10 Any new allergies or adverse reactions: No Accompanied By: None Had a fall or experienced change in No Transfer Assistance: None activities of daily living that may affect Patient Identification Verified: Yes risk of falls: Secondary Verification Process Completed: Yes Signs or symptoms of abuse/neglect since last visito No Patient Requires Transmission-Based Precautions: No Hospitalized since last visit: No Patient Has Alerts: No Implantable device outside of the clinic excluding Yes cellular tissue based products placed in the center since last visit: Pain Present Now: No Electronic Signature(s) Signed: 12/12/2021 5:11:50 PM By: [name] EMT Previous Signature: 12/12/2021 5:09:48 PM Version By: [name] EMT Entered By: [name] on 12/12/2021 17:11:50 -------------------------------------------------------------------------------- Vitals Details Patient Name: Date of Service: [name] 12/12/2021 1:00 PM Medical Record Number: [id number] Patient Account Number: [id number] Date of Birth/Sex: Treating RN: 11/07/1960 (61 y.o. [name] Primary Care Canda Podgorski: [name] Other Clinician: [name] Referring Reis Goga: Treating Bryan Omura/Extender: [name] Weeks  in Treatment: 5 Vital Signs Time Taken: 12:15 Temperature (F): 98.6 Height (in): 60 Pulse (bpm): 69 Weight (lbs): 88 Respiratory Rate (breaths/min): 14 Body Mass Index (BMI): 17.2 Blood Pressure (mmHg): 92/66 Reference Range: 80 - 120 mg / dl Electronic Signature(s) Signed: 12/12/2021 5:13:52 PM By: [name] EMT Previous Signature: 12/12/2021 5:10:12 PM Version By: [name] EMT Entered By: [name] on 12/12/2021 17:13:51

## 2021-12-13 ENCOUNTER — Encounter (HOSPITAL_BASED_OUTPATIENT_CLINIC_OR_DEPARTMENT_OTHER): Payer: Medicare Other | Admitting: General Surgery

## 2021-12-13 DIAGNOSIS — M8788 Other osteonecrosis, other site: Secondary | ICD-10-CM | POA: Diagnosis not present

## 2021-12-13 NOTE — Progress Notes (Addendum)
KHARI, LETT ([id number]) Visit Report for 12/13/2021 Arrival Information Details Patient Name: Date of Service: [name] 12/13/2021 1:00 PM Medical Record Number: [id number] Patient Account Number: [id number] Date of Birth/Sex: Treating RN: 19-Aug-1960 (61 y.o. [name] Primary Care Shaelin Lalley: [name] Other Clinician: [name] Referring Canaan Prue: Treating Tylin Stradley/Extender: Mathews Argyle in Treatment: 5 Visit Information History Since Last Visit All ordered tests and consults were completed: Yes Patient Arrived: Ambulatory Added or deleted any medications: No Arrival Time: 12:21 Any new allergies or adverse reactions: No Accompanied By: None Had a fall or experienced change in No Transfer Assistance: None activities of daily living that may affect Patient Identification Verified: Yes risk of falls: Secondary Verification Process Completed: Yes Signs or symptoms of abuse/neglect since last visito No Patient Requires Transmission-Based Precautions: No Hospitalized since last visit: No Patient Has Alerts: No Implantable device outside of the clinic excluding No cellular tissue based products placed in the center since last visit: Pain Present Now: No Electronic Signature(s) Signed: 12/13/2021 2:14:56 PM By: [name] EMT Entered By: [name] on 12/13/2021 14:14:56 -------------------------------------------------------------------------------- Encounter Discharge Information Details Patient Name: Date of Service: [name] 12/13/2021 1:00 PM Medical Record Number: [id number] Patient Account Number: [id number] Date of Birth/Sex: Treating RN: [birth year] (61 y.o. [name] Primary Care Arleen Bar: [name] Other Clinician: [name] Referring Ronniesha Seibold: Treating Ioma Chismar/Extender: Mathews Argyle in Treatment: 5 Encounter Discharge Information  Items Discharge Condition: Stable Ambulatory Status: Ambulatory Discharge Destination: Home Transportation: Other Accompanied By: None Schedule Follow-up Appointment: Yes Clinical Summary of Care: Electronic Signature(s) Signed: 12/13/2021 3:17:21 PM By: [name] EMT Entered By: [name] on 12/13/2021 15:17:21 -------------------------------------------------------------------------------- Vitals Details Patient Name: Date of Service: [name] 12/13/2021 1:00 PM Medical Record Number: [id number] Patient Account Number: [id number] Date of Birth/Sex: Treating RN: 10-22-1960 (61 y.o. [name] Primary Care Marlow Hendrie: [name] Other Clinician: [name] Referring Quaniya Damas: Treating Macon Sandiford/Extender: [name] Weeks in Treatment: 5 Vital Signs Time Taken: 12:36 Temperature (F): 98.2 Height (in): 60 Pulse (bpm): 78 Weight (lbs): 88 Respiratory Rate (breaths/min): 16 Body Mass Index (BMI): 17.2 Blood Pressure (mmHg): 91/68 Reference Range: 80 - 120 mg / dl Electronic Signature(s) Signed: 12/13/2021 2:15:24 PM By: [name] EMT Entered By: [name] on 12/13/2021 14:15:23

## 2021-12-13 NOTE — Progress Notes (Addendum)
[name], [name] ([id number]) Visit Report for 12/13/2021 HBO Details Patient Name: Date of Service: [name] 12/13/2021 1:00 PM Medical Record Number: [id number] Patient Account Number: [id number] Date of Birth/Sex: Treating RN: 1960/09/23 (61 y.o. 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, Jaw HBO Treatment Details Treatment Number: 24 Patient Type: Outpatient Chamber Type: Monoplace Chamber Serial #: [id number] Treatment Protocol: 2.5 ATA with 90 minutes oxygen, with two 5 minute air breaks Treatment Details Compression Rate Down: 2.0 psi / minute De-Compression Rate Up: 2.0 psi / minute A breaks and breathing ir Compress Tx Pressure periods Decompress Decompress Begins Reached (leave unused spaces Begins Ends blank) Chamber Pressure (ATA 1 2.5 2.5 2.5 2.5 2.5 - - 2.5 1 ) Clock Time (24 hr) 12:41 12:52 13:22 13:27 13:57 14:02 - - 14:32 14:42 Treatment Length: 121 (minutes) Treatment Segments: 4 Vital Signs Capillary Blood Glucose Reference Range: 80 - 120 mg / dl HBO Diabetic Blood Glucose Intervention Range: <131 mg/dl or >249 mg/dl Time Vitals Blood Respiratory Capillary Blood Glucose Pulse Action Type: Pulse: Temperature: Taken: Pressure: Rate: Glucose (mg/dl): Meter #: Oximetry (%) Taken: Pre 12:36 91/68 78 16 98.2 Post 11:45 92/61 56 14 98.4 Treatment Response Treatment Toleration: Well Treatment Completion Status: Treatment Completed without Adverse Event Physician HBO Attestation: I certify that I supervised this HBO treatment in accordance with Medicare guidelines. A trained  emergency response team is readily available per Yes hospital policies and procedures. Continue HBOT as ordered. Yes Electronic Signature(s) Signed: 12/13/2021 3:53:45 PM By: Theresa Maudlin MD FACS Previous Signature: 12/13/2021 3:14:49 PM Version By: [name] EMT Previous Signature: 12/13/2021 2:17:40 PM Version By: [name] EMT Entered By: [name] on 12/13/2021 15:53:45 -------------------------------------------------------------------------------- HBO Safety Checklist Details Patient Name: Date of Service: [name] 12/13/2021 1:00 PM Medical Record Number: [id number] Patient Account Number: [id number] Date of Birth/Sex: Treating RN: [birth year] (61 y.o. [name] Primary Care [name]: [name] Other Clinician: Valeria Dillon Referring [name]: Treating [name]/Extender: [name] Weeks in Treatment: 5 HBO Safety Checklist Items Safety Checklist Consent Form Signed Patient voided / foley secured and emptied When did you last eato 1000 Last dose of injectable or oral agent NA Ostomy pouch emptied and vented if applicable NA All implantable devices assessed, documented and approved NA Intravenous access site secured and place NA Valuables secured Linens and cotton and cotton/polyester blend (less than 51% polyester) Personal oil-based products / skin lotions / body lotions removed Wigs or hairpieces removed NA Smoking or tobacco materials removed Books / newspapers / magazines / loose paper removed Cologne, aftershave, perfume and deodorant removed Jewelry removed (may wrap wedding band) NA Make-up removed Hair care products removed Battery operated devices (external) removed Heating patches and chemical warmers removed Titanium eyewear removed NA Nail polish cured greater than 10 hours 12/08/2021 Casting material cured greater than 10 hours NA Hearing aids removed NA Loose dentures or  partials removed NA Prosthetics have been removed NA Patient demonstrates correct use of air break device (if applicable) Patient concerns have been addressed Patient grounding bracelet on and cord attached to chamber Specifics for Inpatients (complete in addition to above) Medication sheet sent with patient NA Intravenous medications  needed or due during therapy sent with patient NA Drainage tubes (e.g. nasogastric tube or chest tube secured and vented) NA Endotracheal or Tracheotomy tube secured NA Cuff deflated of air and inflated with saline NA Airway suctioned NA Notes Safety checklist was done before treatment started. Electronic Signature(s) Signed: 12/13/2021 2:21:29 PM By: [name] EMT Previous Signature: 12/13/2021 2:16:44 PM Version By: [name] EMT Entered By: [name] on 12/13/2021 14:21:29

## 2021-12-13 NOTE — Progress Notes (Signed)
[name], [name] ([id number]) Visit Report for 12/13/2021 Problem List Details Patient Name: Date of Service: [name] 12/13/2021 1:00 PM Medical Record Number: [id number] Patient Account Number: [id number] Date of Birth/Sex: Treating RN: 03/19/1961 (61 y.o. 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 12/13/2021 Medical Record Number: [id number] Patient Account Number: [id number] Date of Birth/Sex: Treating RN: Jan 17, 1961 (61 y.o. 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

## 2021-12-14 ENCOUNTER — Encounter (HOSPITAL_BASED_OUTPATIENT_CLINIC_OR_DEPARTMENT_OTHER): Payer: Medicare Other | Admitting: General Surgery

## 2021-12-17 ENCOUNTER — Encounter (HOSPITAL_BASED_OUTPATIENT_CLINIC_OR_DEPARTMENT_OTHER): Payer: Medicare Other | Admitting: Internal Medicine

## 2021-12-17 DIAGNOSIS — Z923 Personal history of irradiation: Secondary | ICD-10-CM | POA: Diagnosis not present

## 2021-12-17 DIAGNOSIS — C411 Malignant neoplasm of mandible: Secondary | ICD-10-CM | POA: Diagnosis not present

## 2021-12-17 DIAGNOSIS — M8788 Other osteonecrosis, other site: Secondary | ICD-10-CM | POA: Diagnosis not present

## 2021-12-17 DIAGNOSIS — M272 Inflammatory conditions of jaws: Secondary | ICD-10-CM

## 2021-12-17 NOTE — Progress Notes (Signed)
SHELLEE, STRENG ([id number]) Visit Report for 12/17/2021 Arrival Information Details Patient Name: Date of Service: [name] 12/17/2021 10:00 A M Medical Record Number: [id number] Patient Account Number: [id number] Date of Birth/Sex: Treating RN: [birth year] (61 y.o. [name], [name] Primary Care Lexiana Spindel: [name] Other Clinician: [name] Referring Imane Burrough: Treating Ersel Enslin/Extender: [name] in Treatment: 6 Visit Information History Since Last Visit All ordered tests and consults were completed: Yes Patient Arrived: Ambulatory Added or deleted any medications: No Arrival Time: 09:41 Any new allergies or adverse reactions: No Accompanied By: None Had a fall or experienced change in No Transfer Assistance: None activities of daily living that may affect Patient Identification Verified: Yes risk of falls: Secondary Verification Process Completed: Yes Signs or symptoms of abuse/neglect since last visito No Patient Requires Transmission-Based Precautions: No Hospitalized since last visit: No Patient Has Alerts: No Implantable device outside of the clinic excluding No cellular tissue based products placed in the center since last visit: Pain Present Now: No Electronic Signature(s) Signed: 12/17/2021 4:35:51 PM By: [name] EMT Entered By: [name] on 12/17/2021 16:35:51 -------------------------------------------------------------------------------- Encounter Discharge Information Details Patient Name: Date of Service: [name] Lamonte Sakai [name] 12/17/2021 10:00 A M Medical Record Number: [id number] Patient Account Number: [id number] Date of Birth/Sex: Treating RN: April 16, 1961 (61 y.o. [name] Primary Care Michoel Kunin: [name] Other Clinician: [name] Referring Willene Holian: Treating Reinette Cuneo/Extender: [name] in Treatment: 6 Encounter Discharge Information  Items Discharge Condition: Stable Ambulatory Status: Ambulatory Discharge Destination: Home Transportation: Private Auto Accompanied By: None Schedule Follow-up Appointment: Yes Clinical Summary of Care: Electronic Signature(s) Signed: 12/17/2021 4:43:05 PM By: [name] EMT Entered By: [name] on 12/17/2021 16:43:05 -------------------------------------------------------------------------------- Vitals Details Patient Name: Date of Service: [name] 12/17/2021 10:00 A M Medical Record Number: [id number] Patient Account Number: [id number] Date of Birth/Sex: Treating RN: 09-18-1960 (61 y.o. [name], [name] Primary Care Harshal Sirmon: [name] Other Clinician: [name] Referring Tenishia Ekman: Treating Kamill Fulbright/Extender: [name] Weeks in Treatment: 6 Vital Signs Time Taken: 09:35 Temperature (F): 98.2 Height (in): 60 Pulse (bpm): 72 Weight (lbs): 88 Respiratory Rate (breaths/min): 16 Body Mass Index (BMI): 17.2 Blood Pressure (mmHg): 94/77 Reference Range: 80 - 120 mg / dl Electronic Signature(s) Signed: 12/17/2021 4:36:20 PM By: [name] EMT Entered By: [name] on 12/17/2021 16:36:20

## 2021-12-17 NOTE — Progress Notes (Signed)
Theresa, Dillon ([id number]) Visit Report for 12/17/2021 HBO Details Patient Name: Date of Service: [name] 12/17/2021 10:00 A M Medical Record Number: [id number] Patient Account Number: [id number] Date of Birth/Sex: Treating RN: 03/23/1961 (61 y.o. 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, Jaw HBO Treatment Details Treatment Number: 25 Patient Type: Outpatient Chamber Type: Monoplace Chamber Serial #: [id number] Treatment Protocol: 2.5 ATA with 90 minutes oxygen, with two 5 minute air breaks Treatment Details Compression Rate Down: 2.0 psi / minute De-Compression Rate Up: 2.0 psi / minute A breaks and breathing ir Compress Tx Pressure periods Decompress Decompress Begins Reached (leave unused spaces Begins Ends blank) Chamber Pressure (ATA 1 2.5 2.5 2.5 2.5 2.5 - - 2.5 1 ) Clock Time (24 hr) 10:42 10:54 11:24 11:29 11:59 12:04 - - 12:34 12:48 Treatment Length: 126 (minutes) Treatment Segments: 4 Vital Signs Capillary Blood Glucose Reference Range: 80 - 120 mg / dl HBO Diabetic Blood Glucose Intervention Range: <131 mg/dl or >249 mg/dl Time Vitals Blood Respiratory Capillary Blood Glucose Pulse Action Type: Pulse: Temperature: Taken: Pressure: Rate: Glucose (mg/dl): Meter #: Oximetry (%) Taken: Pre 09:35 94/77 72 16 98.2 Post 12:51 99/74 59 16 98 Treatment Response Treatment Toleration: Well Treatment Completion Status: Treatment Completed without Adverse Event Physician HBO Attestation: I certify that I supervised this HBO treatment in accordance with Medicare guidelines. A trained emergency  response team is readily available per Yes hospital policies and procedures. Continue HBOT as ordered. Yes Electronic Signature(s) Signed: 12/18/2021 6:23:18 PM By: [name] DO Previous Signature: 12/17/2021 4:41:50 PM Version By: [name] EMT Entered By: [name] on 12/18/2021 18:21:47 -------------------------------------------------------------------------------- HBO Safety Checklist Details Patient Name: Date of Service: [name] 12/17/2021 10:00 A M Medical Record Number: [id number] Patient Account Number: [id number] Date of Birth/Sex: Treating RN: [birth year] (61 y.o. [name], [name] Primary Care [name]: [name] Other Clinician: [name] Referring Kyleeann Cremeans: Treating Treesa Mccully/Extender: [name] Weeks in Treatment: 6 HBO Safety Checklist Items Safety Checklist Consent Form Signed Patient voided / foley secured and emptied When did you last eato 0900 Last dose of injectable or oral agent NA Ostomy pouch emptied and vented if applicable NA All implantable devices assessed, documented and approved NA Intravenous access site secured and place NA Valuables secured Linens and cotton and cotton/polyester blend (less than 51% polyester) Personal oil-based products / skin lotions / body lotions removed Wigs or hairpieces removed NA Smoking or tobacco materials removed Books / newspapers / magazines / loose paper removed Cologne, aftershave, perfume and deodorant removed Jewelry removed (may wrap wedding band) NA Make-up removed Hair care products removed Battery operated devices (external) removed Heating patches and chemical warmers removed Titanium eyewear removed NA Nail polish cured greater than 10 hours 12/08/2021 Casting material cured greater than 10 hours NA Hearing aids removed NA Loose dentures or partials removed NA Prosthetics have been removed NA Patient demonstrates correct  use of air break device (if applicable) Patient concerns have been addressed Patient grounding bracelet on and cord attached to chamber Specifics for Inpatients (complete in addition to above) Medication sheet sent with patient NA Intravenous medications needed or due during therapy sent with patient NA  Drainage tubes (e.g. nasogastric tube or chest tube secured and vented) NA Endotracheal or Tracheotomy tube secured NA Cuff deflated of air and inflated with saline NA Airway suctioned NA Notes Safety checklist was done before treatment started. Electronic Signature(s) Signed: 12/17/2021 4:37:39 PM By: [name] EMT Entered By: [name] on 12/17/2021 16:37:38

## 2021-12-18 ENCOUNTER — Encounter (HOSPITAL_BASED_OUTPATIENT_CLINIC_OR_DEPARTMENT_OTHER): Payer: Medicare Other | Admitting: Internal Medicine

## 2021-12-18 DIAGNOSIS — M8788 Other osteonecrosis, other site: Secondary | ICD-10-CM | POA: Diagnosis not present

## 2021-12-18 DIAGNOSIS — M272 Inflammatory conditions of jaws: Secondary | ICD-10-CM | POA: Diagnosis not present

## 2021-12-18 DIAGNOSIS — C411 Malignant neoplasm of mandible: Secondary | ICD-10-CM

## 2021-12-18 DIAGNOSIS — Z923 Personal history of irradiation: Secondary | ICD-10-CM | POA: Diagnosis not present

## 2021-12-18 NOTE — Progress Notes (Addendum)
[name], [name] ([id number]) Visit Report for 12/18/2021 HBO Details Patient Name: Date of Service: [name] 12/18/2021 1:00 PM Medical Record Number: [id number] Patient Account Number: [id number] Date of Birth/Sex: Treating RN: 10-Sep-1960 (61 y.o. 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, Jaw HBO Treatment Details Treatment Number: 26 Patient Type: Outpatient Chamber Type: Monoplace Chamber Serial #: [id number] Treatment Protocol: 2.5 ATA with 90 minutes oxygen, with two 5 minute air breaks Treatment Details Compression Rate Down: 2.0 psi / minute De-Compression Rate Up: 2.0 psi / minute A breaks and breathing ir Compress Tx Pressure periods Decompress Decompress Begins Reached (leave unused spaces Begins Ends blank) Chamber Pressure (ATA 1 2.5 2.5 2.5 2.5 2.5 - - 2.5 1 ) Clock Time (24 hr) 13:10 13:23 13:53 13:58 14:28 14:32 - - 15:03 15:12 Treatment Length: 122 (minutes) Treatment Segments: 4 Vital Signs Capillary Blood Glucose Reference Range: 80 - 120 mg / dl HBO Diabetic Blood Glucose Intervention Range: <131 mg/dl or >249 mg/dl Time Vitals Blood Respiratory Capillary Blood Glucose Pulse Action Type: Pulse: Temperature: Taken: Pressure: Rate: Glucose (mg/dl): Meter #: Oximetry (%) Taken: Pre 13:06 93/70 70 14 98.2 Post 15:21 104/65 58 16 98.2 Treatment Response Treatment Toleration: Well Treatment Completion Status: Treatment Completed without Adverse Event Physician HBO Attestation: I certify that I supervised this HBO treatment in accordance with Medicare guidelines. A trained  emergency response team is readily available per Yes hospital policies and procedures. Continue HBOT as ordered. Yes Electronic Signature(s) Signed: 12/20/2021 5:19:24 PM By: [name] DO Previous Signature: 12/18/2021 4:52:47 PM Version By: [name] EMT Entered By: [name] on 12/20/2021 17:18:35 -------------------------------------------------------------------------------- HBO Safety Checklist Details Patient Name: Date of Service: [name] 12/18/2021 1:00 PM Medical Record Number: [id number] Patient Account Number: [id number] Date of Birth/Sex: Treating RN: Jul 18, 1960 (61 y.o. [name], [name] Primary Care Undra Trembath: [name] Other Clinician: [name] Referring Padraic Marinos: Treating Eria Lozoya/Extender: [name] Weeks in Treatment: 6 HBO Safety Checklist Items Safety Checklist Consent Form Signed Patient voided / foley secured and emptied When did you last eato 1000 Last dose of injectable or oral agent NA Ostomy pouch emptied and vented if applicable NA All implantable devices assessed, documented and approved Power Port Intravenous access site secured and place Power [employer] blend (less than 51% polyester) Personal oil-based products / skin lotions / body lotions removed Wigs or hairpieces removed NA Smoking or tobacco materials removed Books / newspapers / magazines / loose paper removed Cologne, aftershave, perfume and deodorant removed Jewelry removed (may wrap wedding band) NA Make-up removed Hair care products removed Battery operated devices (external) removed Heating patches and chemical warmers removed Titanium eyewear removed NA Nail polish cured greater than 10 hours NA 12/08/2021 Casting material cured greater than 10 hours NA Hearing aids removed NA Loose dentures or partials removed NA Prosthetics have been  removed NA Patient demonstrates correct use of air break device (if applicable) Patient concerns have been addressed Patient grounding bracelet on and cord attached to chamber Specifics for Inpatients (complete in addition to above) Medication sheet sent with patient NA Intravenous medications needed or due during therapy sent with patient  NA Drainage tubes (e.g. nasogastric tube or chest tube secured and vented) NA Endotracheal or Tracheotomy tube secured NA Cuff deflated of air and inflated with saline NA Airway suctioned NA Electronic Signature(s) Signed: 12/18/2021 4:51:16 PM By: [name] EMT Entered By: [name] on 12/18/2021 16:51:16

## 2021-12-18 NOTE — Progress Notes (Signed)
Theresa, Dillon ([id number]) Visit Report for 12/18/2021 Arrival Information Details Patient Name: Date of Service: [name] 12/18/2021 1:00 PM Medical Record Number: [id number] Patient Account Number: [id number] Date of Birth/Sex: Treating RN: 04-Nov-1960 (61 y.o. [name], [name] Primary Care Andersson Larrabee: [name] Other Clinician: [name] Referring Tremayne Sheldon: Treating Britten Seyfried/Extender: [name] in Treatment: 6 Visit Information History Since Last Visit All ordered tests and consults were completed: Yes Patient Arrived: Ambulatory Added or deleted any medications: No Arrival Time: 12:36 Any new allergies or adverse reactions: No Accompanied By: None Had a fall or experienced change in No Transfer Assistance: None activities of daily living that may affect Patient Identification Verified: Yes risk of falls: Secondary Verification Process Completed: Yes Signs or symptoms of abuse/neglect since last visito No Patient Requires Transmission-Based Precautions: No Hospitalized since last visit: No Patient Has Alerts: No Implantable device outside of the clinic excluding No cellular tissue based products placed in the center since last visit: Pain Present Now: No Electronic Signature(s) Signed: 12/18/2021 4:49:41 PM By: [name] EMT Entered By: [name] on 12/18/2021 16:49:40 -------------------------------------------------------------------------------- Encounter Discharge Information Details Patient Name: Date of Service: [name] 12/18/2021 1:00 PM Medical Record Number: [id number] Patient Account Number: [id number] Date of Birth/Sex: Treating RN: 1961-03-27 (61 y.o. [name] Primary Care Dandrae Kustra: [name] Other Clinician: [name] Referring Gershon Shorten: Treating Lovelle Deitrick/Extender: [name] in Treatment: 6 Encounter Discharge Information  Items Discharge Condition: Stable Ambulatory Status: Ambulatory Discharge Destination: Home Transportation: Private Auto Accompanied By: None Schedule Follow-up Appointment: Yes Clinical Summary of Care: Electronic Signature(s) Signed: 12/18/2021 4:53:56 PM By: [name] EMT Entered By: [name] on 12/18/2021 16:53:56 -------------------------------------------------------------------------------- Vitals Details Patient Name: Date of Service: [name] 12/18/2021 1:00 PM Medical Record Number: [id number] Patient Account Number: [id number] Date of Birth/Sex: Treating RN: 14-Sep-1960 (61 y.o. [name] Primary Care Laurice Kimmons: [name] Other Clinician: [name] Referring Hussam Muniz: Treating [name]/Extender: [name] Weeks in Treatment: 6 Vital Signs Time Taken: 13:06 Temperature (F): 98.2 Height (in): 60 Pulse (bpm): 70 Weight (lbs): 88 Respiratory Rate (breaths/min): 14 Body Mass Index (BMI): 17.2 Blood Pressure (mmHg): 93/70 Reference Range: 80 - 120 mg / dl Electronic Signature(s) Signed: 12/18/2021 4:50:08 PM By: [name] EMT Entered By: [name] on 12/18/2021 16:50:08

## 2021-12-18 NOTE — Progress Notes (Signed)
AFTON, LAVALLE ([id number]) Visit Report for 12/17/2021 Problem List Details Patient Name: Date of Service: [name] 12/17/2021 10:00 A M Medical Record Number: [id number] Patient Account Number: [id number] Date of Birth/Sex: Treating RN: 1961/05/13 (61 y.o. 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 12/17/2021 Medical Record Number: [id number] Patient Account Number: [id number] Date of Birth/Sex: Treating RN: 07-06-1960 (61 y.o. 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

## 2021-12-19 ENCOUNTER — Encounter (HOSPITAL_BASED_OUTPATIENT_CLINIC_OR_DEPARTMENT_OTHER): Payer: Medicare Other | Admitting: General Surgery

## 2021-12-19 DIAGNOSIS — M8788 Other osteonecrosis, other site: Secondary | ICD-10-CM | POA: Diagnosis not present

## 2021-12-19 NOTE — Progress Notes (Signed)
ANNAROSE, OUELLET ([id number]) Visit Report for 12/19/2021 Arrival Information Details Patient Name: Date of Service: [name] 12/19/2021 1:00 PM Medical Record Number: [id number] Patient Account Number: [id number] Date of Birth/Sex: Treating RN: 09-Nov-1960 (61 y.o. [name] Primary Care Faustine Tates: [name] Other Clinician: [name] Referring Nykeria Mealing: Treating Grady Mohabir/Extender: Mathews Argyle in Treatment: 6 Visit Information History Since Last Visit All ordered tests and consults were completed: Yes Patient Arrived: Ambulatory Added or deleted any medications: No Arrival Time: 12:01 Any new allergies or adverse reactions: No Accompanied By: None Had a fall or experienced change in No Transfer Assistance: None activities of daily living that may affect Patient Identification Verified: Yes risk of falls: Secondary Verification Process Completed: Yes Signs or symptoms of abuse/neglect since last visito No Patient Requires Transmission-Based Precautions: No Hospitalized since last visit: No Patient Has Alerts: No Implantable device outside of the clinic excluding No cellular tissue based products placed in the center since last visit: Pain Present Now: No Electronic Signature(s) Signed: 12/19/2021 4:08:29 PM By: [name] EMT Entered By: [name] on 12/19/2021 16:08:29 -------------------------------------------------------------------------------- Encounter Discharge Information Details Patient Name: Date of Service: [name] 12/19/2021 1:00 PM Medical Record Number: [id number] Patient Account Number: [id number] Date of Birth/Sex: Treating RN: May 11, 1961 (61 y.o. [name] Primary Care Kutler Vanvranken: [name] Other Clinician: [name] Referring Vincente Asbridge: Treating Saxton Chain/Extender: Mathews Argyle in Treatment: 6 Encounter Discharge Information  Items Discharge Condition: Stable Ambulatory Status: Ambulatory Discharge Destination: Home Transportation: Private Auto Accompanied By: None Schedule Follow-up Appointment: Yes Clinical Summary of Care: Electronic Signature(s) Signed: 12/19/2021 4:14:33 PM By: [name] EMT Entered By: [name] on 12/19/2021 16:14:33 -------------------------------------------------------------------------------- Vitals Details Patient Name: Date of Service: [name] 12/19/2021 1:00 PM Medical Record Number: [id number] Patient Account Number: [id number] Date of Birth/Sex: Treating RN: July 20, 1960 (61 y.o. [name] Primary Care Brydon Spahr: [name] Other Clinician: [name] Referring Demia Viera: Treating Terrell Ostrand/Extender: [name] Weeks in Treatment: 6 Vital Signs Time Taken: 12:36 Temperature (F): 98.4 Height (in): 60 Pulse (bpm): 65 Weight (lbs): 88 Respiratory Rate (breaths/min): 16 Body Mass Index (BMI): 17.2 Blood Pressure (mmHg): 97/68 Reference Range: 80 - 120 mg / dl Electronic Signature(s) Signed: 12/19/2021 4:10:39 PM By: [name] EMT Entered By: [name] on 12/19/2021 16:10:39

## 2021-12-19 NOTE — Progress Notes (Signed)
DALEIGH, POLLINGER ([id number]) Visit Report for 12/19/2021 Problem List Details Patient Name: Date of Service: Theresa LLA [name] 12/19/2021 1:00 PM Medical Record Number: [id number] Patient Account Number: [id number] Date of Birth/Sex: Treating RN: 04/29/1961 (61 y.o. 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 12/19/2021 Medical Record Number: [id number] Patient Account Number: [id number] Date of Birth/Sex: Treating RN: Nov 05, 1960 (61 y.o. 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

## 2021-12-19 NOTE — Progress Notes (Signed)
[name], [name] ([id number]) Visit Report for 12/19/2021 HBO Details Patient Name: Date of Service: [name] 12/19/2021 1:00 PM Medical Record Number: [id number] Patient Account Number: [id number] Date of Birth/Sex: Treating RN: 1961/06/03 (61 y.o. 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, Jaw HBO Treatment Details Treatment Number: 27 Patient Type: Outpatient Chamber Type: Monoplace Chamber Serial #: [id number] Treatment Protocol: 2.5 ATA with 90 minutes oxygen, with two 5 minute air breaks Treatment Details Compression Rate Down: 2.0 psi / minute De-Compression Rate Up: 2.0 psi / minute A breaks and breathing ir Compress Tx Pressure periods Decompress Decompress Begins Reached (leave unused spaces Begins Ends blank) Chamber Pressure (ATA 1 2.5 2.5 2.5 2.5 2.5 - - 2.5 1 ) Clock Time (24 hr) 12:54 13:10 10:40 13:45 14:15 14:20 - - 14:50 15:03 Treatment Length: 129 (minutes) Treatment Segments: 4 Vital Signs Capillary Blood Glucose Reference Range: 80 - 120 mg / dl HBO Diabetic Blood Glucose Intervention Range: <131 mg/dl or >249 mg/dl Time Vitals Blood Respiratory Capillary Blood Glucose Pulse Action Type: Pulse: Temperature: Taken: Pressure: Rate: Glucose (mg/dl): Meter #: Oximetry (%) Taken: Pre 12:36 97/68 65 16 98.4 Post 15:06 99/68 55 14 98 Treatment Response Treatment Toleration: Well Treatment Completion Status: Treatment Completed without Adverse Event Physician HBO Attestation: I certify that I supervised this HBO treatment in accordance with Medicare guidelines. A trained emergency  response team is readily available per Yes hospital policies and procedures. Continue HBOT as ordered. Yes Electronic Signature(s) Signed: 12/19/2021 5:00:58 PM By: [name] MD FACS Previous Signature: 12/19/2021 4:13:35 PM Version By: [name] EMT Entered By: [name] on 12/19/2021 17:00:57 -------------------------------------------------------------------------------- HBO Safety Checklist Details Patient Name: Date of Service: [name] 12/19/2021 1:00 PM Medical Record Number: [id number] Patient Account Number: [id number] Date of Birth/Sex: Treating RN: [birth year] (61 y.o. [name] Primary Care Armani Gawlik: [name] Other Clinician: [name] Referring Kamera Dubas: Treating Arbutus Nelligan/Extender: [name] Weeks in Treatment: 6 HBO Safety Checklist Items Safety Checklist Consent Form Signed Patient voided / foley secured and emptied When did you last eato 1000 Last dose of injectable or oral agent NA Ostomy pouch emptied and vented if applicable NA All implantable devices assessed, documented and approved Power Port Intravenous access site secured and place Power [employer] blend (less than 51% polyester) Personal oil-based products / skin lotions / body lotions removed Wigs or hairpieces removed NA Smoking or tobacco materials removed Books / newspapers / magazines / loose paper removed Cologne, aftershave, perfume and deodorant removed Jewelry removed (may wrap wedding band) NA Make-up removed NA Hair care products removed Battery operated devices (external) removed Heating patches and chemical warmers removed Titanium eyewear removed NA Nail polish cured greater than 10 hours Casting material cured greater than 10 hours NA Hearing aids removed NA Loose dentures or partials removed NA Prosthetics have been removed NA Patient demonstrates  correct use of air break device (if applicable) Patient concerns have been addressed Patient grounding bracelet on and cord attached to chamber Specifics for Inpatients (complete in addition to above) Medication sheet sent with patient NA Intravenous medications needed or due during therapy sent with patient  NA Drainage tubes (e.g. nasogastric tube or chest tube secured and vented) NA Endotracheal or Tracheotomy tube secured NA Cuff deflated of air and inflated with saline NA Airway suctioned NA Notes The safety checklist was done before treatment started. Electronic Signature(s) Signed: 12/19/2021 4:12:34 PM By: [name] EMT Entered By: [name] on 12/19/2021 16:12:33

## 2021-12-20 ENCOUNTER — Encounter (HOSPITAL_BASED_OUTPATIENT_CLINIC_OR_DEPARTMENT_OTHER): Payer: Medicare Other | Admitting: General Surgery

## 2021-12-20 DIAGNOSIS — M8788 Other osteonecrosis, other site: Secondary | ICD-10-CM | POA: Diagnosis not present

## 2021-12-20 NOTE — Progress Notes (Addendum)
[name], [name] ([id number]) Visit Report for 12/20/2021 HBO Details Patient Name: Date of Service: Theresa LLA [name] 12/20/2021 1:00 PM Medical Record Number: [id number] Patient Account Number: [id number] Date of Birth/Sex: Treating RN: [birth year] (61 y.o. 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, Jaw HBO Treatment Details Treatment Number: 28 Patient Type: Outpatient Chamber Type: Monoplace Chamber Serial #: [id number] Treatment Protocol: 2.5 ATA with 90 minutes oxygen, with two 5 minute air breaks Treatment Details Compression Rate Down: 2.0 psi / minute De-Compression Rate Up: A breaks and breathing ir Compress Tx Pressure periods Decompress Decompress Begins Reached (leave unused spaces Begins Ends blank) Chamber Pressure (ATA 1 2.5 2.5 2.5 2.5 2.5 - - 2.5 1 ) Clock Time (24 hr) 12:40 12:52 13:22 13:27 13:58 14:03 - - 14:33 14:42 Treatment Length: 122 (minutes) Treatment Segments: 4 Vital Signs Capillary Blood Glucose Reference Range: 80 - 120 mg / dl HBO Diabetic Blood Glucose Intervention Range: <131 mg/dl or >249 mg/dl Time Vitals Blood Respiratory Capillary Blood Glucose Pulse Action Type: Pulse: Temperature: Taken: Pressure: Rate: Glucose (mg/dl): Meter #: Oximetry (%) Taken: Pre 12:30 96/75 68 16 97.9 Post 14:44 64/63 52 14 98 Treatment Response Treatment Toleration: Well Treatment Completion Status: Treatment Completed without Adverse Event Physician HBO Attestation: I certify that I supervised this HBO treatment in accordance with Medicare guidelines. A trained emergency response team is  readily available per Yes hospital policies and procedures. Continue HBOT as ordered. Yes Electronic Signature(s) Signed: 12/20/2021 4:08:46 PM By: [name] MD FACS Previous Signature: 12/20/2021 2:47:50 PM Version By: [name] EMT Previous Signature: 12/20/2021 2:40:49 PM Version By: [name] EMT Entered By: [name] on 12/20/2021 16:08:45 -------------------------------------------------------------------------------- HBO Safety Checklist Details Patient Name: Date of Service: Theresa LLA [name] 12/20/2021 1:00 PM Medical Record Number: [id number] Patient Account Number: [id number] Date of Birth/Sex: Treating RN: 1961/03/23 (61 y.o. [name], [name] Primary Care Trayce Caravello: [name] Other Clinician: [name] Referring Lucienne Sawyers: Treating Delani Kohli/Extender: [name] Weeks in Treatment: 6 HBO Safety Checklist Items Safety Checklist Consent Form Signed Patient voided / foley secured and emptied When did you last eato 1100 Last dose of injectable or oral agent NA Ostomy pouch emptied and vented if applicable NA All implantable devices assessed, documented and approved Power Port Intravenous access site secured and place Power [employer] blend (less than 51% polyester) Personal oil-based products / skin lotions / body lotions removed Wigs or hairpieces removed NA Smoking or tobacco materials removed Books / newspapers / magazines / loose paper removed Cologne, aftershave, perfume and deodorant removed Jewelry removed (may wrap wedding band) NA Make-up removed Hair care products removed Battery operated devices (external) removed Heating patches and chemical warmers removed Titanium eyewear removed NA Nail polish cured greater than 10 hours 12/08/2021 Casting material cured greater than 10 hours NA Hearing aids removed NA Loose dentures or partials  removed NA Prosthetics have been removed NA Patient demonstrates correct use of air break device (if applicable) Patient concerns have been addressed Patient grounding bracelet on and cord attached to chamber Specifics for Inpatients (complete in addition to above) Medication sheet sent with patient NA Intravenous medications needed or  due during therapy sent with patient NA Drainage tubes (e.g. nasogastric tube or chest tube secured and vented) NA Endotracheal or Tracheotomy tube secured NA Cuff deflated of air and inflated with saline NA Airway suctioned NA Notes The safety checklist was done before treatment started. Electronic Signature(s) Signed: 12/20/2021 2:39:59 PM By: [name] EMT Entered By: [name] on 12/20/2021 14:39:59

## 2021-12-20 NOTE — Progress Notes (Signed)
[name], [name] ([id number]) Visit Report for 12/20/2021 Arrival Information Details Patient Name: Date of Service: Theresa LLA [name] 12/20/2021 1:00 PM Medical Record Number: [id number] Patient Account Number: [id number] Date of Birth/Sex: Treating RN: 1961/05/27 (61 y.o. [name], [name] Primary Care Graci Hulce: [name] Other Clinician: Donavan Burnet Referring Claborn Janusz: Treating Laiklynn Raczynski/Extender: Mathews Argyle in Treatment: 6 Visit Information History Since Last Visit All ordered tests and consults were completed: Yes Patient Arrived: Ambulatory Added or deleted any medications: No Arrival Time: 12:33 Any new allergies or adverse reactions: No Accompanied By: self Had a fall or experienced change in No Transfer Assistance: None activities of daily living that may affect Patient Identification Verified: Yes risk of falls: Secondary Verification Process Completed: Yes Signs or symptoms of abuse/neglect since last visito No Patient Requires Transmission-Based Precautions: No Hospitalized since last visit: No Patient Has Alerts: No Implantable device outside of the clinic excluding No cellular tissue based products placed in the center since last visit: Pain Present Now: No Electronic Signature(s) Signed: 12/20/2021 4:18:42 PM By: Donavan Burnet CHT EMT BS , , Entered By: Donavan Burnet on 12/20/2021 13:45:21 -------------------------------------------------------------------------------- Encounter Discharge Information Details Patient Name: Date of Service: Theresa [name] 12/20/2021 1:00 PM Medical Record Number: [id number] Patient Account Number: [id number] Date of Birth/Sex: Treating RN: 10-Jan-1961 (61 y.o. [name] Primary Care Tamico Mundo: [name] Other Clinician: [name] Referring Jacere Pangborn: Treating Jabbar Palmero/Extender: Mathews Argyle in Treatment: 6 Encounter  Discharge Information Items Discharge Condition: Stable Ambulatory Status: Ambulatory Discharge Destination: Home Transportation: Private Auto Accompanied By: None Schedule Follow-up Appointment: Yes Clinical Summary of Care: Electronic Signature(s) Signed: 12/20/2021 2:55:44 PM By: [name] EMT Entered By: [name] on 12/20/2021 14:55:44 -------------------------------------------------------------------------------- Vitals Details Patient Name: Date of Service: Theresa LLA [name] 12/20/2021 1:00 PM Medical Record Number: [id number] Patient Account Number: [id number] Date of Birth/Sex: Treating RN: Jun 09, 1961 (61 y.o. [name] Primary Care Milas Schappell: [name] Other Clinician: [name] Referring Mahagony Grieb: Treating Sabriel Borromeo/Extender: [name] Weeks in Treatment: 6 Vital Signs Time Taken: 12:30 Temperature (F): 97.9 Height (in): 60 Pulse (bpm): 68 Weight (lbs): 88 Respiratory Rate (breaths/min): 16 Body Mass Index (BMI): 17.2 Blood Pressure (mmHg): 96/75 Reference Range: 80 - 120 mg / dl Electronic Signature(s) Signed: 12/20/2021 4:18:42 PM By: Donavan Burnet CHT EMT BS , , Entered By: Donavan Burnet on 12/20/2021 13:46:44

## 2021-12-20 NOTE — Progress Notes (Signed)
[name], [name] ([id number]) Visit Report for 12/20/2021 Problem List Details Patient Name: Date of Service: [name] 12/20/2021 1:00 PM Medical Record Number: [id number] Patient Account Number: [id number] Date of Birth/Sex: Treating RN: 02-Jul-1960 (61 y.o. 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 12/20/2021 Medical Record Number: [id number] Patient Account Number: [id number] Date of Birth/Sex: Treating RN: [birth year] (61 y.o. 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

## 2021-12-21 ENCOUNTER — Encounter (HOSPITAL_BASED_OUTPATIENT_CLINIC_OR_DEPARTMENT_OTHER): Payer: Medicare Other | Admitting: General Surgery

## 2021-12-21 DIAGNOSIS — M8788 Other osteonecrosis, other site: Secondary | ICD-10-CM | POA: Diagnosis not present

## 2021-12-24 ENCOUNTER — Encounter (HOSPITAL_BASED_OUTPATIENT_CLINIC_OR_DEPARTMENT_OTHER): Payer: Medicare Other | Admitting: Internal Medicine

## 2021-12-24 DIAGNOSIS — M272 Inflammatory conditions of jaws: Secondary | ICD-10-CM | POA: Diagnosis not present

## 2021-12-24 DIAGNOSIS — C411 Malignant neoplasm of mandible: Secondary | ICD-10-CM

## 2021-12-24 DIAGNOSIS — M8788 Other osteonecrosis, other site: Secondary | ICD-10-CM

## 2021-12-24 DIAGNOSIS — Z923 Personal history of irradiation: Secondary | ICD-10-CM

## 2021-12-25 ENCOUNTER — Encounter (HOSPITAL_BASED_OUTPATIENT_CLINIC_OR_DEPARTMENT_OTHER): Payer: Medicare Other | Admitting: Internal Medicine

## 2021-12-25 DIAGNOSIS — M8788 Other osteonecrosis, other site: Secondary | ICD-10-CM | POA: Diagnosis not present

## 2021-12-26 ENCOUNTER — Encounter (HOSPITAL_BASED_OUTPATIENT_CLINIC_OR_DEPARTMENT_OTHER): Payer: Medicare Other | Admitting: General Surgery

## 2021-12-26 DIAGNOSIS — M8788 Other osteonecrosis, other site: Secondary | ICD-10-CM | POA: Diagnosis not present

## 2021-12-27 ENCOUNTER — Encounter (HOSPITAL_BASED_OUTPATIENT_CLINIC_OR_DEPARTMENT_OTHER): Payer: Medicare Other | Admitting: General Surgery

## 2021-12-28 ENCOUNTER — Encounter (HOSPITAL_BASED_OUTPATIENT_CLINIC_OR_DEPARTMENT_OTHER): Payer: Medicare Other | Admitting: General Surgery

## 2021-12-28 DIAGNOSIS — M8788 Other osteonecrosis, other site: Secondary | ICD-10-CM | POA: Diagnosis not present

## 2021-12-28 NOTE — Progress Notes (Signed)
[name], [name] ([id number]) Visit Report for 12/28/2021 Arrival Information Details Patient Name: Date of Service: [name] Theresa Alexander D. 12/28/2021 1:00 PM Medical Record Number: [id number] Patient Account Number: [id number] Date of Birth/Sex: Treating RN: [birth year] (61 y.o. [name] Primary Care [name]: [name] Other Clinician: Referring Sanay Belmar: Treating Leimomi Zervas/Extender: Mathews Argyle in Treatment: 7 Visit Information History Since Last Visit Added or deleted any medications: No Patient Arrived: Ambulatory Any new allergies or adverse reactions: No Arrival Time: 12:25 Had a fall or experienced change in No Accompanied By: self activities of daily living that may affect Transfer Assistance: None risk of falls: Patient Identification Verified: Yes Signs or symptoms of abuse/neglect since last visito No Secondary Verification Process Completed: Yes Hospitalized since last visit: No Patient Requires Transmission-Based Precautions: No Implantable device outside of the clinic excluding No Patient Has Alerts: No cellular tissue based products placed in the center since last visit: Pain Present Now: No Electronic Signature(s) Signed: 12/28/2021 4:48:44 PM By: Deon Pilling RN, BSN Entered By: Deon Pilling on 12/28/2021 13:17:02 -------------------------------------------------------------------------------- Encounter Discharge Information Details Patient Name: Date of Service: [name] HA Theresa Bienenstock D. 12/28/2021 1:00 PM Medical Record Number: [id number] Patient Account Number: [id number] Date of Birth/Sex: Treating RN: [birth year] (61 y.o. [name] Primary Care Huyen Perazzo: [name] Other Clinician: Referring Jarryd Gratz: Treating Naeemah Jasmer/Extender: Mathews Argyle in Treatment: 7 Encounter Discharge Information Items Discharge Condition: Stable Ambulatory Status: Ambulatory Discharge  Destination: Home Transportation: Private Auto Accompanied By: self Schedule Follow-up Appointment: Yes Clinical Summary of Care: Electronic Signature(s) Signed: 12/28/2021 4:48:44 PM By: Deon Pilling RN, BSN Entered By: Deon Pilling on 12/28/2021 14:49:07 -------------------------------------------------------------------------------- Vitals Details Patient Name: Date of Service: [name] Theresa Alexander D. 12/28/2021 1:00 PM Medical Record Number: [id number] Patient Account Number: [id number] Date of Birth/Sex: Treating RN: [birth year] (61 y.o. [name], [name] Primary Care Saleah Rishel: [name] Other Clinician: Referring Eddith Mentor: Treating Finnean Cerami/Extender: Mathews Argyle in Treatment: 7 Vital Signs Time Taken: 12:25 Temperature (F): 98 Height (in): 60 Pulse (bpm): 73 Weight (lbs): 88 Respiratory Rate (breaths/min): 16 Body Mass Index (BMI): 17.2 Blood Pressure (mmHg): 104/88 Reference Range: 80 - 120 mg / dl Electronic Signature(s) Signed: 12/28/2021 4:48:44 PM By: Deon Pilling RN, BSN Entered By: Deon Pilling on 12/28/2021 13:17:46

## 2021-12-28 NOTE — Progress Notes (Signed)
[name], [name] ([id number]) Visit Report for 12/28/2021 Problem List Details Patient Name: Date of Service: [name] 12/28/2021 1:00 PM Medical Record Number: [id number] Patient Account Number: [id number] Date of Birth/Sex: Treating RN: 12-Jun-1961 (61 y.o. 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 12/28/2021 Medical Record Number: [id number] Patient Account Number: [id number] Date of Birth/Sex: Treating RN: June 23, 1961 (61 y.o. 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, unspecified Facility Procedures CPT4 Code: 12751700 Description: G0277-(Facility Use Only) HBOT full body chamber, 5mn , Modifier: Quantity: 4 Physician  Procedures : CPT4 Code Description Modifier [id number]- WC PHYS HYPERBARIC OXYGEN THERAPY ICD-10 Diagnosis Description 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

## 2021-12-28 NOTE — Progress Notes (Signed)
SIGNORA, ZUCCO ([id number]) Visit Report for 12/28/2021 HBO Details Patient Name: Date of Service: [name] 12/28/2021 1:00 PM Medical Record Number: [id number] Patient Account Number: [id number] Date of Birth/Sex: Treating RN: 09/27/1960 (61 y.o. 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, Jaw HBO Treatment Details Treatment Number: 33 Patient Type: Outpatient Chamber Type: Monoplace Chamber Serial #: [id number] Treatment Protocol: 2.5 ATA with 90 minutes oxygen, with two 5 minute air breaks Treatment Details Compression Rate Down: 2.5 psi / minute De-Compression Rate Up: 2.5 psi / minute A breaks and breathing ir Compress Tx Pressure periods Decompress Decompress Begins Reached (leave unused spaces Begins Ends blank) Chamber Pressure (ATA 1 2.5 2.5 2.5 2.5 2.5 - - 2.5 1 ) Clock Time (24 hr) 12:33 12:45 13:15 13:20 13:50 13:55 - - 14:25 14:35 Treatment Length: 122 (minutes) Treatment Segments: 4 Vital Signs Capillary Blood Glucose Reference Range: 80 - 120 mg / dl HBO Diabetic Blood Glucose Intervention Range: <131 mg/dl or >249 mg/dl Time Vitals Blood Respiratory Capillary Blood Glucose Pulse Action Type: Pulse: Temperature: Taken: Pressure: Rate: Glucose (mg/dl): Meter #: Oximetry (%) Taken: Pre 12:25 104/88 73 16 98 Post 14:36 114/74 63 16 98.5 Treatment Response Treatment Toleration: Well Treatment Completion Status: Treatment Completed without Adverse Event Physician HBO Attestation: I certify that I supervised this HBO treatment in accordance with Medicare guidelines. A trained emergency response team  is readily available per Yes hospital policies and procedures. Continue HBOT as ordered. Yes Electronic Signature(s) Signed: 12/28/2021 4:39:27 PM By: [name] MD FACS Entered By: [name] on 12/28/2021 16:39:27 -------------------------------------------------------------------------------- HBO Safety Checklist Details Patient Name: Date of Service: [name] 12/28/2021 1:00 PM Medical Record Number: [id number] Patient Account Number: [id number] Date of Birth/Sex: Treating RN: [birth year] (61 y.o. [name], [name] Primary Care Tomia Enlow: [name] Other Clinician: Referring [name]: Treating Derric Dealmeida/Extender: Mathews Argyle in Treatment: 7 HBO Safety Checklist Items Safety Checklist Consent Form Signed Patient voided / foley secured and emptied When did you last eato 1200 Last dose of injectable or oral agent n/a Ostomy pouch emptied and vented if applicable NA All implantable devices assessed, documented and approved Intravenous access site secured and place NA Valuables secured Linens and cotton and cotton/polyester blend (less than 51% polyester) Personal oil-based products / skin lotions / body lotions removed NA Wigs or hairpieces removed NA Smoking or tobacco materials removed NA Books / newspapers / magazines / loose paper removed NA Cologne, aftershave, perfume and deodorant removed NA Jewelry removed (may wrap wedding band) NA Make-up removed NA Hair care products removed NA Battery operated devices (external) removed NA Heating patches and chemical warmers removed NA Titanium eyewear removed NA Nail polish cured greater than 10 hours NA Casting material cured greater than 10 hours NA Hearing aids removed NA Loose dentures or partials removed NA Prosthetics have been removed NA Patient demonstrates correct use of air break device (if applicable) Patient concerns have been  addressed Patient grounding bracelet on and cord attached to chamber Specifics for Inpatients (complete in addition to above) Medication sheet sent with patient Intravenous medications needed or due during therapy sent with patient Drainage tubes (e.g. nasogastric tube or chest tube secured and  vented) Endotracheal or Tracheotomy tube secured Cuff deflated of air and inflated with saline Airway suctioned Electronic Signature(s) Signed: 12/28/2021 4:48:44 PM By: Deon Pilling RN, BSN Entered By: Deon Pilling on 12/28/2021 13:18:37

## 2021-12-31 ENCOUNTER — Encounter (HOSPITAL_BASED_OUTPATIENT_CLINIC_OR_DEPARTMENT_OTHER): Payer: Medicare Other | Attending: General Surgery | Admitting: General Surgery

## 2021-12-31 DIAGNOSIS — C049 Malignant neoplasm of floor of mouth, unspecified: Secondary | ICD-10-CM | POA: Diagnosis not present

## 2021-12-31 DIAGNOSIS — M8788 Other osteonecrosis, other site: Secondary | ICD-10-CM | POA: Diagnosis not present

## 2021-12-31 DIAGNOSIS — M272 Inflammatory conditions of jaws: Secondary | ICD-10-CM | POA: Insufficient documentation

## 2021-12-31 DIAGNOSIS — Z923 Personal history of irradiation: Secondary | ICD-10-CM | POA: Diagnosis not present

## 2021-12-31 DIAGNOSIS — C411 Malignant neoplasm of mandible: Secondary | ICD-10-CM | POA: Insufficient documentation

## 2021-12-31 NOTE — Progress Notes (Addendum)
[name] ([id number]) Visit Report for 12/31/2021 Arrival Information Details Patient Name: Date of Service: [name] 12/31/2021 1:00 PM Medical Record Number: [id number] Patient Account Number: [id number] Date of Birth/Sex: Treating RN: 09/18/1960 (61 y.o. [name], [name] Primary Care Oveda Dadamo: [name] Other Clinician: [name] Referring Marvel Mcphillips: Treating Karryn Kosinski/Extender: Mathews Argyle in Treatment: 8 Visit Information History Since Last Visit All ordered tests and consults were completed: Yes Patient Arrived: Ambulatory Added or deleted any medications: No Arrival Time: 12:09 Any new allergies or adverse reactions: No Accompanied By: None Had a fall or experienced change in No Transfer Assistance: None activities of daily living that may affect Patient Identification Verified: Yes risk of falls: Secondary Verification Process Completed: Yes Signs or symptoms of abuse/neglect since last visito No Patient Requires Transmission-Based Precautions: No Hospitalized since last visit: No Patient Has Alerts: No Implantable device outside of the clinic excluding No cellular tissue based products placed in the center since last visit: Pain Present Now: No Electronic Signature(s) Signed: 12/31/2021 2:56:45 PM By: [name] EMT Entered By: [name] on 12/31/2021 14:56:45 -------------------------------------------------------------------------------- Encounter Discharge Information Details Patient Name: Date of Service: [name] Gearldine Dillon D. 12/31/2021 1:00 PM Medical Record Number: [id number] Patient Account Number: [id number] Date of Birth/Sex: Treating RN: 04-03-1961 (61 y.o. [name] Primary Care Lendy Dittrich: [name] Other Clinician: [name] Referring Bensyn Bornemann: Treating Kamila Broda/Extender: Mathews Argyle in Treatment: 8 Encounter Discharge Information  Items Discharge Condition: Stable Ambulatory Status: Ambulatory Discharge Destination: Home Transportation: Private Auto Accompanied By: None Schedule Follow-up Appointment: Yes Clinical Summary of Care: Electronic Signature(s) Signed: 12/31/2021 3:13:21 PM By: [name] EMT Entered By: [name] on 12/31/2021 15:13:21 -------------------------------------------------------------------------------- Vitals Details Patient Name: Date of Service: [name] 12/31/2021 1:00 PM Medical Record Number: [id number] Patient Account Number: [id number] Date of Birth/Sex: Treating RN: [birth year] (61 y.o. [name] Primary Care Zae Kirtz: [name] Other Clinician: [name] Referring Vyron Fronczak: Treating Emanuelle Bastos/Extender: [name] Weeks in Treatment: 8 Vital Signs Time Taken: 12:16 Temperature (F): 98.2 Height (in): 60 Pulse (bpm): 79 Weight (lbs): 88 Respiratory Rate (breaths/min): 16 Body Mass Index (BMI): 17.2 Blood Pressure (mmHg): 99/69 Reference Range: 80 - 120 mg / dl Electronic Signature(s) Signed: 12/31/2021 2:57:11 PM By: [name] EMT Entered By: [name] on 12/31/2021 14:57:11

## 2021-12-31 NOTE — Progress Notes (Signed)
MARRA, FRAGA ([id number]) Visit Report for 12/31/2021 SuperBill Details Patient Name: Date of Service: [name] 12/31/2021 Medical Record Number: [id number] Patient Account Number: [id number] Date of Birth/Sex: Treating RN: 07-19-1960 (61 y.o. 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, unspecified Facility Procedures CPT4 Code Description Modifier Quantity [id number] G0277-(Facility Use Only) HBOT full body chamber, 71mn , 4 ICD-10 Diagnosis Description M87.88 Other osteonecrosis, 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

## 2021-12-31 NOTE — Progress Notes (Addendum)
Theresa, Dillon ([id number]) Visit Report for 12/31/2021 HBO Details Patient Name: Date of Service: [name] 12/31/2021 1:00 PM Medical Record Number: [id number] Patient Account Number: [id number] Date of Birth/Sex: Treating RN: September 08, 1960 (61 y.o. 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, Jaw HBO Treatment Details Treatment Number: 34 Patient Type: Outpatient Chamber Type: Monoplace Chamber Serial #: [id number] Treatment Protocol: 2.5 ATA with 90 minutes oxygen, with two 5 minute air breaks Treatment Details Compression Rate Down: 2.0 psi / minute De-Compression Rate Up: 2.0 psi / minute A breaks and breathing ir Compress Tx Pressure periods Decompress Decompress Begins Reached (leave unused spaces Begins Ends blank) Chamber Pressure (ATA 1 2.5 2.5 2.5 2.5 2.5 - - 2.5 1 ) Clock Time (24 hr) 12:24 12:39 13:10 13:15 13:45 13:50 - - 14:20 14:32 Treatment Length: 128 (minutes) Treatment Segments: 4 Vital Signs Capillary Blood Glucose Reference Range: 80 - 120 mg / dl HBO Diabetic Blood Glucose Intervention Range: <131 mg/dl or >249 mg/dl Time Vitals Blood Respiratory Capillary Blood Glucose Pulse Action Type: Pulse: Temperature: Taken: Pressure: Rate: Glucose (mg/dl): Meter #: Oximetry (%) Taken: Pre 12:16 99/69 79 16 98.2 Post 14:35 113/67 56 16 98.1 Treatment Response Treatment Toleration: Well Treatment Completion Status: Treatment Completed without Adverse Event Physician HBO Attestation: I certify that I supervised this HBO treatment in accordance with Medicare guidelines. A trained  emergency response team is readily available per Yes hospital policies and procedures. Continue HBOT as ordered. Yes Electronic Signature(s) Signed: 12/31/2021 3:22:04 PM By: [name] MD FACS Previous Signature: 12/31/2021 3:12:26 PM Version By: [name] EMT Entered By: [name] on 12/31/2021 15:22:03 -------------------------------------------------------------------------------- HBO Safety Checklist Details Patient Name: Date of Service: [name] 12/31/2021 1:00 PM Medical Record Number: [id number] Patient Account Number: [id number] Date of Birth/Sex: Treating RN: 1961/05/16 (61 y.o. [name], [name] Primary Care Avrielle Fry: [name] Other Clinician: [name] Referring Martha Soltys: Treating Anicia Leuthold/Extender: [name] Weeks in Treatment: 8 HBO Safety Checklist Items Safety Checklist Consent Form Signed Patient voided / foley secured and emptied When did you last eato 1030 Last dose of injectable or oral agent NA Ostomy pouch emptied and vented if applicable NA All implantable devices assessed, documented and approved Power Port Intravenous access site secured and place Power [employer] blend (less than 51% polyester) Personal oil-based products / skin lotions / body lotions removed Wigs or hairpieces removed NA Smoking or tobacco materials removed Books / newspapers / magazines / loose paper removed Cologne, aftershave, perfume and deodorant removed Jewelry removed (may wrap wedding band) NA Make-up removed Hair care products removed Battery operated devices (external) removed Heating patches and chemical warmers removed Titanium eyewear removed NA Nail polish cured greater than 10 hours 12/08/2021 Casting material cured greater than 10 hours NA Hearing aids removed NA Loose dentures or partials removed NA Prosthetics have been  removed NA Patient demonstrates correct use of air break device (if applicable) Patient concerns have been addressed Patient grounding bracelet on and cord attached to chamber Specifics for Inpatients (complete in addition to above) Medication sheet sent with patient NA Intravenous medications needed or due during therapy sent with patient  NA Drainage tubes (e.g. nasogastric tube or chest tube secured and vented) NA Endotracheal or Tracheotomy tube secured NA Cuff deflated of air and inflated with saline NA Airway suctioned NA Notes The safety checklist was done before treatment started. Electronic Signature(s) Signed: 12/31/2021 2:59:07 PM By: [name] EMT Entered By: [name] on 12/31/2021 14:59:07

## 2022-01-02 ENCOUNTER — Encounter (HOSPITAL_BASED_OUTPATIENT_CLINIC_OR_DEPARTMENT_OTHER): Payer: Medicare Other | Admitting: General Surgery

## 2022-01-02 DIAGNOSIS — M272 Inflammatory conditions of jaws: Secondary | ICD-10-CM | POA: Diagnosis not present

## 2022-01-02 NOTE — Progress Notes (Signed)
[name], [name] ([id number]) Visit Report for 01/02/2022 Problem List Details Patient Name: Date of Service: [name] 01/02/2022 1:30 PM Medical Record Number: [id number] Patient Account Number: [id number] Date of Birth/Sex: Treating RN: 1960/10/22 (61 y.o. 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 01/02/2022 Medical Record Number: [id number] Patient Account Number: [id number] Date of Birth/Sex: Treating RN: [birth year] (61 y.o. 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

## 2022-01-02 NOTE — Progress Notes (Signed)
[name], [name] ([id number]) Visit Report for 01/02/2022 Chief Complaint Document Details Patient Name: Date of Service: [name] 01/02/2022 12:45 PM Medical Record Number: [id number] Patient Account Number: [id number] Date of Birth/Sex: Treating RN: [birth year] (61 y.o. 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 01/02/2022 12:45 PM Medical Record Number: [id number] Patient Account Number: [id number] Date of Birth/Sex: Treating RN: [birth year] (61 y.o. 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 She underwent resection with a fibular free flap in followed by a chin implant and tissue rearrangement at [facility] in June 2021. She underwent chemotherapy with cisplatin, completed in September 2021. She received radiation therapy of 66 Gray in 33 fractions which was completed in September 2021. She also previously had resection of gingival and tongue cancer with a marginal mandibulectomy. This was back in 2012. More recently, an attempt at maxillary dental implants was made.  During the course of this, it was hypothesized that she had some transient bacteremia which seeded her titanium implant. This subsequently eroded through her skin and she has a draining sinus tract. She is followed by otolaryngology at [facility]. They felt that her best chance of healing the wound and preserving her implant, specifically in the setting of radiation therapy would be a prolonged course of IV antibiotics as well as hyperbaric oxygen therapy. She was initially seen by the [facility] wound care clinic but expressed a preference to come here. Fortunately, the majority of the work-up was done by Dr. Con Memos. This included an EKG and chest x-ray. She does have a Port-A-Cath and will be initiating IV antibiotics today for 6-week course of ertapenem. She has been Referred to undergo hyperbaric oxygen therapy at our center. 12/05/2021: 30-day interval follow-up. She says that she thinks she has oral thrush from her IV antibiotics and is also complaining of a vaginal yeast infection. She has been tolerating hyperbaric oxygen therapy without difficulty. She was supposed to have posts placed for dental implants earlier this week, but the oral surgeon felt like the bone was not in good enough condition. The draining sinus beneath her mandible does not seem to be putting out much. 01/02/2022: 30-day interval follow-up. She has recovered from oral and vaginal yeast infections. She continues to tolerate hyperbaric oxygen therapy. She is feeling a little bit frustrated that her other providers are telling her that the exposed bone will just eventually fall off and the wound should close over the gap. She is not noticing any substantial drainage from the site. Electronic Signature(s) Signed: 01/02/2022 12:59:15 PM By: [name] MD FACS Entered By: [name] on 01/02/2022 12:59:15 -------------------------------------------------------------------------------- Physical Exam Details Patient Name:  Date of Service: [name] 01/02/2022 12:45 PM Medical Record Number: [id number] Patient Account Number: [id number] Date of Birth/Sex: Treating RN: Aug 05, 1960 (61 y.o.  [name] Primary Care Provider: [name] Other Clinician: Referring Provider: Treating Provider/Extender: [name] Weeks in Treatment: 8 Constitutional . . . . No acute distress.. Neck Skin changes consistent with prior radiation therapy. Multiple scars from reconstructive surgery. Exposed bone protruding on the right mandible, adjacent to the chin. No erythema, induration, or purulent drainage.Marland Kitchen Respiratory Normal work of breathing on room air.. Electronic Signature(s) Signed: 01/02/2022 1:00:55 PM By: [name] MD FACS Entered By: [name] on 01/02/2022 13:00:55 -------------------------------------------------------------------------------- Physician Orders Details Patient Name: Date of Service: [name] 01/02/2022 12:45 PM Medical Record Number: [id number] Patient Account Number: [id number] Date of Birth/Sex: Treating RN: 1961-04-14 (61 y.o. 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, unspecified Follow-up Appointments ppointment in: - 30 days Return A Hyperbaric Oxygen Therapy Evaluate for HBO Therapy Indication: - Osteoradionecrosis of jaw If appropriate for treatment, begin HBOT per protocol: 2.5 ATA for 90 Minutes with 2 Five (5) Minute A Breaks ir Total Number of Treatments: - 40 more treatments 01/02/22 One  treatments per day (delivered Monday through Friday unless otherwise specified in Special Instructions below): A frin (Oxymetazoline HCL) 0.05% nasal spray - 1 spray in both nostrils daily as needed prior to HBO treatment for difficulty clearing ears Consults Plastic Surgery - Osteoradionecrosis of jaw receiving hyperbaric oxygen therapy - exposed bone, evaluation for debridement and coverage Electronic Signature(s) Signed: 01/02/2022 1:09:35 PM By: [name] MD FACS Entered By: [name] on 01/02/2022 13:09:35 Prescription 01/02/2022 -------------------------------------------------------------------------------- Janelle Floor D. [name] MD Patient Name: Provider: [birth year] [id number] Date of Birth: NPI#: [id number] Sex: DEA #: [id number] Phone #: License #: [place] Patient Address: [address] [facility] [address] [place], [place] [address] [place], [place] [address] [phone] Allergies codeine; doxycycline HCl; Sulfa (Sulfonamide Antibiotics) Provider's Orders Plastic Surgery - Osteoradionecrosis of jaw receiving hyperbaric oxygen therapy - exposed bone, evaluation for debridement and coverage Hand Signature: Date(s): Electronic Signature(s) Signed: 01/02/2022 1:11:39 PM By: [name] MD FACS Entered By: [name] on 01/02/2022 13:11:39 -------------------------------------------------------------------------------- Problem List Details Patient Name: Date of Service: [name] 01/02/2022 12:45 PM Medical Record Number: [id number] Patient Account Number: [id number] Date of Birth/Sex: Treating RN: 1960-11-30 (61 y.o. 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 01/02/2022 12:45 PM Medical Record Number: [id number] Patient Account Number: [id number] Date of Birth/Sex: Treating RN: [birth year] (61 y.o. 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 She underwent resection with a fibular free flap in followed by a chin implant and tissue rearrangement at [facility] in June 2021. She underwent chemotherapy with cisplatin, completed in September 2021. She received radiation therapy of 66 Gray in 33 fractions which was completed in September 2021. She also previously had resection of gingival and tongue cancer with a marginal mandibulectomy. This was back in 2012. More recently, an attempt at maxillary dental implants was made. During the course of this, it was hypothesized that she had some transient bacteremia which seeded her titanium  implant. This subsequently eroded through her skin and she has a draining sinus tract. She is followed by otolaryngology at [facility]. They felt that her best chance of healing the wound and preserving her implant, specifically in the setting of radiation therapy would be a prolonged course of IV antibiotics as well as hyperbaric oxygen therapy. She was initially seen by the [facility] wound care clinic but expressed a preference to come here. Fortunately, the majority of the work-up was done by Dr. Con Memos. This included an EKG and chest x-ray. She does have a Port-A-Cath and will be initiating IV antibiotics today for 6-week course of ertapenem. She has been Referred to undergo hyperbaric oxygen therapy at our center. 12/05/2021: 30-day interval follow-up. She says that she thinks she has oral thrush from her IV antibiotics and is also complaining of a vaginal yeast infection. She has been tolerating hyperbaric oxygen therapy without difficulty. She was supposed to have posts placed for dental implants earlier this week, but the oral surgeon felt like the bone was not in good enough condition. The draining sinus beneath her mandible does not seem to be putting out much. 01/02/2022: 30-day interval follow-up. She has recovered from oral and vaginal yeast infections. She continues to tolerate hyperbaric oxygen therapy. She is feeling a little bit frustrated that her other providers are telling her that the exposed bone will just eventually fall off and the wound should close over the gap. She is not noticing any substantial drainage from the site. Patient History Family History Unknown History. Social History Former smoker - quit 2014, Marital Status - Married, Alcohol Use - Never, Drug Use - No History, Caffeine Use - Never. Medical History Oncologic Patient has history of Received Chemotherapy - 2021, Received Radiation - 33 tx 2021 Hospitalization/Surgery History - dental implants 12/11/2021. Medical A  Surgical History Notes nd Oncologic [facility] Psychiatric Anxiety, Major Depression Objective Constitutional No acute distress.. Vitals Time Taken: 12:48 PM, Height: 60 in, Weight: 88 lbs, BMI: 17.2, Temperature: 97.8 F, Pulse: 77 bpm, Respiratory Rate: 16 breaths/min, Blood Pressure: 122/76 mmHg. Neck Skin changes consistent with prior radiation therapy. Multiple scars from reconstructive surgery. Exposed bone protruding on the right mandible, adjacent to the chin. No erythema, induration, or purulent drainage.Marland Kitchen Respiratory Normal work of breathing on room air.. 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, unspecified Plan Follow-up Appointments: Return Appointment in: - 30 days Hyperbaric Oxygen Therapy: Evaluate for HBO Therapy Indication: - Osteoradionecrosis of jaw If appropriate for treatment, begin  HBOT per protocol: 2.5 ATA for 90 Minutes with 2 Five (5) Minute Air Breaks T Number of Treatments: - 40 more treatments 01/02/22 otal One treatments per day (delivered Monday through Friday unless otherwise specified in Special Instructions below): Afrin (Oxymetazoline HCL) 0.05% nasal spray - 1 spray in both nostrils daily as needed prior to HBO treatment for difficulty clearing ears Consults ordered were: Plastic Surgery - Osteoradionecrosis of jaw receiving hyperbaric oxygen therapy - exposed bone, evaluation for debridement and coverage 01/02/2022: Skin changes consistent with prior radiation therapy. Multiple scars from reconstructive surgery. Exposed bone protruding on the right mandible, adjacent to the chin. No erythema, induration, or purulent drainage. She is tolerating hyperbaric oxygen therapy well for osteoradionecrosis of the mandible. 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 The patient is interested in a second opinion and so we will place referral to plastic surgery to evaluate for debridement and coverage of the exposed bone. We will continue hyperbaric oxygen therapy. I have extended her course for another 40 treatments. Follow-up in 30 days. Electronic Signature(s) Signed: 01/02/2022 1:11:17 PM By: [name] MD FACS Entered By: [name] on 01/02/2022 13:11:17 -------------------------------------------------------------------------------- HxROS Details Patient Name: Date of Service: [name] 01/02/2022 12:45 PM Medical Record Number: [id number] Patient Account Number: [id number] Date of Birth/Sex: Treating RN: March 19, 1961 (61 y.o. [name] Primary Care Provider: [name] Other Clinician: Referring Provider: Treating Provider/Extender: Mathews Argyle in Treatment: 8 Oncologic Medical History: Positive for: Received Chemotherapy - 2021; Received Radiation - 33 tx 2021 Past Medical History Notes: [facility] Medical History: Past Medical History Notes: Anxiety, Major Depression Immunizations Pneumococcal Vaccine: Received Pneumococcal Vaccination: No Implantable Devices None Hospitalization / Surgery History Type of Hospitalization/Surgery dental implants 12/11/2021 Family and Social History Unknown History: Yes; Former smoker - quit 2014; Marital Status - Married; Alcohol Use: Never; Drug Use: No History; Caffeine Use: Never; Financial Concerns: No; Food, Clothing or Shelter Needs: No; Support System Lacking: No; Transportation Concerns: No Electronic Signature(s) Signed: 01/02/2022 1:19:52 PM By: [name] MD FACS Signed: 01/02/2022 5:10:18 PM By: Adline Peals Entered By: [name] on 01/02/2022  12:59:20 -------------------------------------------------------------------------------- [place] Details Patient Name: Date of Service: [name] 01/02/2022 Medical Record Number: [id number] Patient Account Number: [id number] Date of Birth/Sex: Treating RN: 1960/12/25 (61 y.o. 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

## 2022-01-02 NOTE — Progress Notes (Signed)
AMMA, CREAR ([id number]) Visit Report for 01/02/2022 Arrival Information Details Patient Name: Date of Service: [name] 01/02/2022 12:45 PM Medical Record Number: [id number] Patient Account Number: [id number] Date of Birth/Sex: Treating RN: 1961-06-27 (61 y.o. [name] Primary Care [name]: [name] Other Clinician: Referring [name]: Treating [name]/Extender: Mathews Argyle in Treatment: 8 Visit Information History Since Last Visit Added or deleted any medications: No Patient Arrived: Ambulatory Any new allergies or adverse reactions: No Arrival Time: 12:46 Had a fall or experienced change in No Accompanied By: self activities of daily living that may affect Transfer Assistance: None risk of falls: Patient Identification Verified: Yes Signs or symptoms of abuse/neglect since last visito No Secondary Verification Process Completed: Yes Hospitalized since last visit: No Patient Requires Transmission-Based Precautions: No Implantable device outside of the clinic excluding No Patient Has Alerts: No cellular tissue based products placed in the center since last visit: Pain Present Now: No Electronic Signature(s) Signed: 01/02/2022 5:10:18 PM By: Adline Peals Entered By: Adline Peals on 01/02/2022 12:48:20 -------------------------------------------------------------------------------- Clinic Level of Care Assessment Details Patient Name: Date of Service: Theresa LLA HA Theresa Bienenstock D. 01/02/2022 12:45 PM Medical Record Number: [id number] Patient Account Number: [id number] Date of Birth/Sex: Treating RN: 10/23/1960 (61 y.o. [name] Primary Care [name]: [name] Other Clinician: Referring [name]: Treating [name]/Extender: Mathews Argyle in Treatment: 8 Clinic Level of Care Assessment Items TOOL 4 Quantity Score X- 1 0 Use when only an EandM is performed  on FOLLOW-UP visit ASSESSMENTS - Nursing Assessment / Reassessment X- 1 10 Reassessment of Co-morbidities (includes updates in patient status) X- 1 5 Reassessment of Adherence to Treatment Plan ASSESSMENTS - Wound and Skin A ssessment / Reassessment X - Simple Wound Assessment / Reassessment - one wound 1 5 '[]'$  - 0 Complex Wound Assessment / Reassessment - multiple wounds '[]'$  - 0 Dermatologic / Skin Assessment (not related to wound area) ASSESSMENTS - Focused Assessment '[]'$  - 0 Circumferential Edema Measurements - multi extremities '[]'$  - 0 Nutritional Assessment / Counseling / Intervention '[]'$  - 0 Lower Extremity Assessment (monofilament, tuning fork, pulses) '[]'$  - 0 Peripheral Arterial Disease Assessment (using hand held doppler) ASSESSMENTS - Ostomy and/or Continence Assessment and Care '[]'$  - 0 Incontinence Assessment and Management '[]'$  - 0 Ostomy Care Assessment and Management (repouching, etc.) PROCESS - Coordination of Care X - Simple Patient / Family Education for ongoing care 1 15 '[]'$  - 0 Complex (extensive) Patient / Family Education for ongoing care X- 1 10 Staff obtains Programmer, systems, Records, T Results / Process Orders est '[]'$  - 0 Staff telephones HHA, Nursing Homes / Clarify orders / etc '[]'$  - 0 Routine Transfer to another Facility (non-emergent condition) '[]'$  - 0 Routine Hospital Admission (non-emergent condition) '[]'$  - 0 New Admissions / Biomedical engineer / Ordering NPWT Apligraf, etc. , '[]'$  - 0 Emergency Hospital Admission (emergent condition) X- 1 10 Simple Discharge Coordination '[]'$  - 0 Complex (extensive) Discharge Coordination PROCESS - Special Needs '[]'$  - 0 Pediatric / Minor Patient Management '[]'$  - 0 Isolation Patient Management '[]'$  - 0 Hearing / Language / Visual special needs '[]'$  - 0 Assessment of Community assistance (transportation, D/C planning, etc.) '[]'$  - 0 Additional assistance / Altered mentation '[]'$  - 0 Support Surface(s) Assessment (bed,  cushion, seat, etc.) INTERVENTIONS - Wound Cleansing / Measurement '[]'$  - 0 Simple Wound Cleansing - one wound '[]'$  - 0 Complex Wound Cleansing - multiple wounds '[]'$  - 0 Wound  Imaging (photographs - any number of wounds) '[]'$  - 0 Wound Tracing (instead of photographs) '[]'$  - 0 Simple Wound Measurement - one wound '[]'$  - 0 Complex Wound Measurement - multiple wounds INTERVENTIONS - Wound Dressings '[]'$  - 0 Small Wound Dressing one or multiple wounds '[]'$  - 0 Medium Wound Dressing one or multiple wounds '[]'$  - 0 Large Wound Dressing one or multiple wounds '[]'$  - 0 Application of Medications - topical '[]'$  - 0 Application of Medications - injection INTERVENTIONS - Miscellaneous '[]'$  - 0 External ear exam '[]'$  - 0 Specimen Collection (cultures, biopsies, blood, body fluids, etc.) '[]'$  - 0 Specimen(s) / Culture(s) sent or taken to Lab for analysis '[]'$  - 0 Patient Transfer (multiple staff / Civil Service fast streamer / Similar devices) '[]'$  - 0 Simple Staple / Suture removal (25 or less) '[]'$  - 0 Complex Staple / Suture removal (26 or more) '[]'$  - 0 Hypo / Hyperglycemic Management (close monitor of Blood Glucose) '[]'$  - 0 Ankle / Brachial Index (ABI) - do not check if billed separately X- 1 5 Vital Signs Has the patient been seen at the hospital within the last three years: Yes Total Score: 60 Level Of Care: New/Established - Level 2 Electronic Signature(s) Signed: 01/02/2022 5:10:18 PM By: Adline Peals Entered By: Adline Peals on 01/02/2022 12:53:03 -------------------------------------------------------------------------------- Encounter Discharge Information Details Patient Name: Date of Service: Theresa LLA Theresa Alexander D. 01/02/2022 12:45 PM Medical Record Number: [id number] Patient Account Number: [id number] Date of Birth/Sex: Treating RN: Feb 26, 1961 (61 y.o. [name] Primary Care [name]: [name] Other Clinician: Referring [name]: Treating [name]/Extender: Mathews Argyle in Treatment: 8 Encounter Discharge Information Items Discharge Condition: Stable Ambulatory Status: Ambulatory Discharge Destination: Home Transportation: Private Auto Accompanied By: self Schedule Follow-up Appointment: Yes Clinical Summary of Care: Patient Declined Electronic Signature(s) Signed: 01/02/2022 5:10:18 PM By: Adline Peals Entered By: Adline Peals on 01/02/2022 12:52:30 -------------------------------------------------------------------------------- Lower Extremity Assessment Details Patient Name: Date of Service: Theresa LLA HA Theresa Bienenstock D. 01/02/2022 12:45 PM Medical Record Number: [id number] Patient Account Number: [id number] Date of Birth/Sex: Treating RN: 03-19-1961 (61 y.o. [name] Primary Care Danylah Holden: [name] Other Clinician: Referring Shyana Kulakowski: Treating Hershall Benkert/Extender: [name] Weeks in Treatment: 8 Electronic Signature(s) Signed: 01/02/2022 5:10:18 PM By: Sabas Sous By: Adline Peals on 01/02/2022 12:48:57 -------------------------------------------------------------------------------- Multi Wound Chart Details Patient Name: Date of Service: Theresa LLA Theresa Alexander D. 01/02/2022 12:45 PM Medical Record Number: [id number] Patient Account Number: [id number] Date of Birth/Sex: Treating RN: [birth year] (61 y.o. [name] Primary Care Dayra Rapley: Other Clinician: [name] Referring Agnieszka Newhouse: Treating Neil Errickson/Extender: Mathews Argyle in Treatment: 8 Vital Signs Height(in): 60 Pulse(bpm): 77 Weight(lbs): 88 Blood Pressure(mmHg): 122/76 Body Mass Index(BMI): 17.2 Temperature(F): 97.8 Respiratory Rate(breaths/min): 16 Wound Assessments Treatment Notes Electronic Signature(s) Signed: 01/02/2022 12:57:05 PM By: [name] MD FACS Signed: 01/02/2022 5:10:18 PM By: Adline Peals Entered By: [name] on 01/02/2022 12:57:05 -------------------------------------------------------------------------------- Multi-Disciplinary Care Plan Details Patient Name: Date of Service: Theresa LLA Theresa Alexander D. 01/02/2022 12:45 PM Medical Record Number: [id number] Patient Account Number: [id number] Date of Birth/Sex: Treating RN: [birth year] (61 y.o. 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 and  treatment procedures Discomfort related to temperature and humidity changes inside hyperbaric chamber Potential for barotraumas to ears, sinuses, teeth, and lungs or cerebral gas embolism related to changes in atmospheric pressure inside hyperbaric oxygen chamber Potential for oxygen toxicity seizures related to delivery of 100% oxygen at an increased atmospheric pressure Potential for pulmonary oxygen toxicity related to delivery of 100% oxygen at an increased atmospheric pressure Goals: Barotrauma will be prevented during HBO2 Date Initiated: 11/05/2021 T arget Resolution Date: 01/25/2022 Goal Status: Active Patient and/or family will be able to state/discuss factors appropriate to the management of their disease process during treatment Date Initiated: 11/05/2021 T arget Resolution Date: 01/25/2022 Goal Status: Active Patient will tolerate the hyperbaric oxygen therapy treatment Date Initiated: 11/05/2021 T arget Resolution Date: 01/25/2022 Goal Status: Active Patient will tolerate the internal climate of the chamber Date Initiated: 11/05/2021 T arget Resolution Date: 01/25/2022 Goal Status: Active Patient/caregiver will  verbalize understanding of HBO goals, rationale, procedures and potential hazards Date Initiated: 11/05/2021 T arget Resolution Date: 01/25/2022 Goal Status: Active Signs and symptoms of pulmonary oxygen toxicity will be recognized and promptly addressed Date Initiated: 11/05/2021 T arget Resolution Date: 01/25/2022 Goal Status: Active Signs and symptoms of seizure will be recognized and promptly addressed ; seizing patients will suffer no harm Date Initiated: 11/05/2021 Target Resolution Date: 01/25/2022 Goal Status: Active Interventions: Administer a five (5) minute air break for patient if signs and symptoms of seizure appear and notify the hyperbaric physician Administer the correct therapeutic gas delivery based on the patients needs and limitations, per physician order Assess and provide for patients comfort related to the hyperbaric environment and equalization of middle ear Assess for signs and symptoms related to adverse events, including but not limited to confinement anxiety, pneumothorax, oxygen toxicity and baurotrauma Assess patient for any history of confinement anxiety Assess patient's knowledge and expectations regarding hyperbaric medicine and provide education related to the hyperbaric environment, goals of treatment and prevention of adverse events Implement protocols to decrease risk of pneumothorax in high risk patients Notes: Electronic Signature(s) Signed: 01/02/2022 5:10:18 PM By: Adline Peals Entered By: Adline Peals on 01/02/2022 12:50:47 -------------------------------------------------------------------------------- Pain Assessment Details Patient Name: Date of Service: Theresa LLA Theresa Alexander D. 01/02/2022 12:45 PM Medical Record Number: [id number] Patient Account Number: [id number] Date of Birth/Sex: Treating RN: [birth year] (61 y.o. [name] Primary Care Khori Rosevear: [name] Other Clinician: Referring Abby Stines: Treating  Kolbey Teichert/Extender: Mathews Argyle in Treatment: 8 Active Problems Location of Pain Severity and Description of Pain Patient Has Paino No Site Locations Rate the pain. Current Pain Level: 0 Pain Management and Medication Current Pain Management: Electronic Signature(s) Signed: 01/02/2022 5:10:18 PM By: Adline Peals Entered By: Adline Peals on 01/02/2022 12:48:28 -------------------------------------------------------------------------------- Patient/Caregiver Education Details Patient Name: Date of Service: Theresa LLA HA [name] 7/5/2023andnbsp12:45 PM Medical Record Number: [id number] Patient Account Number: [id number] Date of Birth/Gender: Treating RN: Nov 16, 1960 (61 y.o. [name] Primary Care Physician: [name] Other Clinician: Referring Physician: Treating Physician/Extender: Mathews Argyle in Treatment: 8 Education Assessment Education Provided To: Patient Education Topics Provided Peripheral Neuropathy: Methods: Explain/Verbal Responses: Reinforcements needed, State content correctly Wound/Skin Impairment: Electronic Signature(s) Signed: 01/02/2022 5:10:18 PM By: Adline Peals Entered By: Adline Peals on 01/02/2022 12:51:19 -------------------------------------------------------------------------------- Taylor Details Patient Name: Date of Service: Theresa LLA Theresa Alexander D. 01/02/2022 12:45 PM Medical Record Number: [id number] Patient Account Number: [id number] Date of Birth/Sex: Treating RN: 11-04-1960 (61 y.o. [name] Primary  Care Bhargav Barbaro: [name] Other Clinician: Referring Dee Paden: Treating Amyjo Mizrachi/Extender: Mathews Argyle in Treatment: 8 Vital Signs Time Taken: 12:48 Temperature (F): 97.8 Height (in): 60 Pulse (bpm): 77 Weight (lbs): 88 Respiratory Rate (breaths/min): 16 Body Mass Index (BMI): 17.2 Blood  Pressure (mmHg): 122/76 Reference Range: 80 - 120 mg / dl Electronic Signature(s) Signed: 01/02/2022 5:10:18 PM By: Adline Peals Entered By: Adline Peals on 01/02/2022 12:48:53

## 2022-01-03 ENCOUNTER — Encounter (HOSPITAL_BASED_OUTPATIENT_CLINIC_OR_DEPARTMENT_OTHER): Payer: Medicare Other | Admitting: General Surgery

## 2022-01-03 NOTE — Progress Notes (Addendum)
[name], [name] ([id number]) Visit Report for 01/02/2022 HBO Details Patient Name: Date of Service: [name] Theresa Alexander D. 01/02/2022 1:30 PM Medical Record Number: [id number] Patient Account Number: [id number] Date of Birth/Sex: Treating RN: [birth year] (61 y.o. 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, Jaw HBO Treatment Details Treatment Number: 35 Patient Type: Outpatient Chamber Type: Monoplace Chamber Serial #: [id number] Treatment Protocol: 2.5 ATA with 90 minutes oxygen, with two 5 minute air breaks Treatment Details Compression Rate Down: 2.0 psi / minute De-Compression Rate Up: 2.0 psi / minute A breaks and breathing ir Compress Tx Pressure periods Decompress Decompress Begins Reached (leave unused spaces Begins Ends blank) Chamber Pressure (ATA 1 2.5 2.5 2.5 2.5 2.5 - - 2.5 1 ) Clock Time (24 hr) 13:06 13:17 13:47 13:52 14:22 14:27 - - 14:57 15:09 Treatment Length: 123 (minutes) Treatment Segments: 4 Vital Signs Capillary Blood Glucose Reference Range: 80 - 120 mg / dl HBO Diabetic Blood Glucose Intervention Range: <131 mg/dl or >249 mg/dl Time Vitals Blood Respiratory Capillary Blood Glucose Pulse Action Type: Pulse: Temperature: Taken: Pressure: Rate: Glucose (mg/dl): Meter #: Oximetry (%) Taken: Pre 12:48 122/76 77 16 97.8 Post 15:11 102/71 73 18 98.3 Treatment Response Treatment Toleration: Well Treatment Completion Status: Treatment Completed without Adverse Event Physician HBO Attestation: I certify that I supervised this HBO treatment in accordance with Medicare guidelines. A trained  emergency response team is readily available per Yes hospital policies and procedures. Continue HBOT as ordered. Yes Electronic Signature(s) Signed: 01/03/2022 3:26:58 PM By: [name] Previous Signature: 01/03/2022 2:17:36 PM Version By: [name] Previous Signature: 01/02/2022 3:39:28 PM Version By: [name] Previous Signature: 01/02/2022 3:33:14 PM Version By: [name] Previous Signature: 01/02/2022 2:56:56 PM Version By: [name] Entered By: [name] on 01/03/2022 15:26:57 -------------------------------------------------------------------------------- HBO Safety Checklist Details Patient Name: Date of Service: [name] HA Theresa Bienenstock D. 01/02/2022 1:30 PM Medical Record Number: [id number] Patient Account Number: [id number] Date of Birth/Sex: Treating RN: 10-26-1960 (61 y.o. [name] Primary Care [name]: [name] Other Clinician: Donavan Dillon Referring [name]: Treating [name]: [name] in Treatment: 8 HBO Safety Checklist Items Safety Checklist Consent Form Signed Patient voided / foley secured and emptied When did you last eato 1000 Last dose of injectable or oral agent n/a Ostomy pouch emptied and vented if applicable NA All implantable devices assessed, documented and approved NA Intravenous access site secured and place NA Valuables secured Linens and cotton and cotton/polyester blend (less than 51% polyester) Personal oil-based products / skin lotions / body lotions removed Wigs or hairpieces removed NA Smoking or tobacco materials removed NA Books / newspapers / magazines / loose paper removed Cologne, aftershave, perfume and deodorant removed Jewelry removed (may wrap wedding band) Make-up removed NA Hair care products removed Battery operated devices (external) removed Heating patches and chemical warmers removed Titanium eyewear  removed Nail polish cured greater than 10 hours Casting material cured greater than 10 hours NA Hearing aids removed NA Loose dentures or partials removed NA Prosthetics have been removed NA Patient demonstrates correct use of air break device (if applicable) Patient concerns have been addressed Patient grounding bracelet on  and cord attached to chamber Specifics for Inpatients (complete in addition to above) Medication sheet sent with patient NA Intravenous medications needed or due during therapy sent with patient NA Drainage tubes (e.g. nasogastric tube or chest tube secured and vented) NA Endotracheal or Tracheotomy tube secured NA Cuff deflated of air and inflated with saline NA Airway suctioned NA Notes Paper version used prior to treatment. Electronic Signature(s) Signed: 01/03/2022 1:41:31 PM By: [name] CHT Dillon BS , , Entered By: [name] on 01/02/2022 14:07:36

## 2022-01-03 NOTE — Progress Notes (Signed)
[name], [name] ([id number]) Visit Report for 01/02/2022 Arrival Information Details Patient Name: Date of Service: [name] 01/02/2022 1:30 PM Medical Record Number: [id number] Patient Account Number: [id number] Date of Birth/Sex: Treating RN: [birth year] (61 y.o. [name] Primary Care Zamorah Ailes: [name] Other Clinician: Donavan Burnet Referring Denorris Reust: Treating Cynia Abruzzo/Extender: Mathews Argyle in Treatment: 8 Visit Information History Since Last Visit All ordered tests and consults were completed: Yes Patient Arrived: Ambulatory Added or deleted any medications: No Arrival Time: 12:02 Any new allergies or adverse reactions: No Accompanied By: self Had a fall or experienced change in No Transfer Assistance: None activities of daily living that may affect Patient Identification Verified: Yes risk of falls: Secondary Verification Process Completed: Yes Signs or symptoms of abuse/neglect since last visito No Patient Requires Transmission-Based Precautions: No Hospitalized since last visit: No Patient Has Alerts: No Implantable device outside of the clinic excluding No cellular tissue based products placed in the center since last visit: Pain Present Now: No Electronic Signature(s) Signed: 01/03/2022 1:41:31 PM By: Donavan Burnet CHT EMT BS , , Entered By: Donavan Burnet on 01/02/2022 14:03:54 -------------------------------------------------------------------------------- Encounter Discharge Information Details Patient Name: Date of Service: [name] 01/02/2022 1:30 PM Medical Record Number: [id number] Patient Account Number: [id number] Date of Birth/Sex: Treating RN: 07-Dec-1960 (61 y.o. [name] Primary Care Bellah Alia: [name] Other Clinician: Donavan Burnet Referring Javoris Star: Treating Dastan Krider/Extender: Mathews Argyle in Treatment: 8 Encounter  Discharge Information Items Discharge Condition: Stable Ambulatory Status: Ambulatory Discharge Destination: Home Transportation: Private Auto Accompanied By: None Schedule Follow-up Appointment: Yes Clinical Summary of Care: Electronic Signature(s) Signed: 01/02/2022 3:34:37 PM By: [name] EMT Entered By: [name] on 01/02/2022 15:34:36 -------------------------------------------------------------------------------- Vitals Details Patient Name: Date of Service: [name] 01/02/2022 1:30 PM Medical Record Number: [id number] Patient Account Number: [id number] Date of Birth/Sex: Treating RN: 1961-02-05 (61 y.o. [name] Primary Care Gillian Meeuwsen: [name] Other Clinician: Donavan Burnet Referring Nikala Walsworth: Treating Keilon Ressel/Extender: Mathews Argyle in Treatment: 8 Vital Signs Time Taken: 12:48 Temperature (F): 97.8 Height (in): 60 Pulse (bpm): 77 Weight (lbs): 88 Respiratory Rate (breaths/min): 16 Body Mass Index (BMI): 17.2 Blood Pressure (mmHg): 122/76 Reference Range: 80 - 120 mg / dl Electronic Signature(s) Signed: 01/03/2022 1:41:31 PM By: Donavan Burnet CHT EMT BS , , Entered By: Donavan Burnet on 01/02/2022 14:04:22

## 2022-01-04 ENCOUNTER — Encounter (HOSPITAL_BASED_OUTPATIENT_CLINIC_OR_DEPARTMENT_OTHER): Payer: Medicare Other | Admitting: General Surgery

## 2022-01-04 DIAGNOSIS — M272 Inflammatory conditions of jaws: Secondary | ICD-10-CM | POA: Diagnosis not present

## 2022-01-04 NOTE — Progress Notes (Signed)
[name], [name] ([id number]) Visit Report for 01/04/2022 Problem List Details Patient Name: Date of Service: [name] 01/04/2022 1:00 PM Medical Record Number: [id number] Patient Account Number: [id number] Date of Birth/Sex: Treating RN: 12/13/1960 (61 y.o. 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 01/04/2022 Medical Record Number: [id number] Patient Account Number: [id number] Date of Birth/Sex: Treating RN: 18-Jun-1961 (61 y.o. 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

## 2022-01-04 NOTE — Progress Notes (Addendum)
[name], [name] ([id number]) Visit Report for 01/04/2022 HBO Details Patient Name: Date of Service: [name] 01/04/2022 1:00 PM Medical Record Number: [id number] Patient Account Number: [id number] Date of Birth/Sex: Treating RN: 11/10/1960 (61 y.o. 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, Jaw HBO Treatment Details Treatment Number: 36 Patient Type: Outpatient Chamber Type: Monoplace Chamber Serial #: [id number] Treatment Protocol: 2.5 ATA with 90 minutes oxygen, with two 5 minute air breaks Treatment Details Compression Rate Down: 2.0 psi / minute De-Compression Rate Up: 2.0 psi / minute A breaks and breathing ir Compress Tx Pressure periods Decompress Decompress Begins Reached (leave unused spaces Begins Ends blank) Chamber Pressure (ATA 1 2.5 2.5 2.5 2.5 2.5 - - 2.5 1 ) Clock Time (24 hr) 12:25 12:36 13:06 13:11 13:42 13:47 - - 14:17 14:29 Treatment Length: 124 (minutes) Treatment Segments: 4 Vital Signs Capillary Blood Glucose Reference Range: 80 - 120 mg / dl HBO Diabetic Blood Glucose Intervention Range: <131 mg/dl or >249 mg/dl Time Vitals Blood Respiratory Capillary Blood Glucose Pulse Action Type: Pulse: Temperature: Taken: Pressure: Rate: Glucose (mg/dl): Meter #: Oximetry (%) Taken: Pre 12:19 90/78 104 16 98.4 Post 14:32 97/72 70 16 97.8 Treatment Response Treatment Toleration: Well Treatment Completion Status: Treatment Completed without Adverse Event Physician HBO Attestation: I certify that I supervised this HBO treatment in accordance with Medicare guidelines. A trained emergency  response team is readily available per Yes hospital policies and procedures. Continue HBOT as ordered. Yes Electronic Signature(s) Signed: 01/04/2022 3:13:36 PM By: [name] MD FACS Previous Signature: 01/04/2022 2:50:39 PM Version By: [name] EMT Entered By: [name] on 01/04/2022 15:13:36 -------------------------------------------------------------------------------- HBO Safety Checklist Details Patient Name: Date of Service: [name] 01/04/2022 1:00 PM Medical Record Number: [id number] Patient Account Number: [id number] Date of Birth/Sex: Treating RN: 05-13-1961 (61 y.o. [name], [name] Primary Care [name]: [name] Other Clinician: [name] Referring Liset Mcmonigle: Treating Daneille Desilva/Extender: [name] Weeks in Treatment: 8 HBO Safety Checklist Items Safety Checklist Consent Form Signed Patient voided / foley secured and emptied When did you last eato 1030 Last dose of injectable or oral agent NA Ostomy pouch emptied and vented if applicable NA All implantable devices assessed, documented and approved NA Intravenous access site secured and place Power [employer] secured Linens and cotton and cotton/polyester blend (less than 51% polyester) Personal oil-based products / skin lotions / body lotions removed Wigs or hairpieces removed NA Smoking or tobacco materials removed Books / newspapers / magazines / loose paper removed Cologne, aftershave, perfume and deodorant removed Jewelry removed (may wrap wedding band) NA Make-up removed Hair care products removed Battery operated devices (external) removed Heating patches and chemical warmers removed Titanium eyewear removed NA Nail polish cured greater than 10 hours 12/29/2021 Casting material cured greater than 10 hours NA Hearing aids removed NA Loose dentures or partials removed NA Prosthetics have been removed NA Patient demonstrates  correct use of air break device (if applicable) Patient concerns have been addressed Patient grounding bracelet on and cord attached to chamber Specifics for Inpatients (complete in addition to above) Medication sheet sent with patient NA Intravenous medications needed or due during therapy sent with patient NA  Drainage tubes (e.g. nasogastric tube or chest tube secured and vented) NA Endotracheal or Tracheotomy tube secured NA Cuff deflated of air and inflated with saline NA Airway suctioned NA Notes The safety checklist was done before the treatment was started. Electronic Signature(s) Signed: 01/04/2022 2:49:21 PM By: [name] EMT Entered By: [name] on 01/04/2022 14:49:21

## 2022-01-04 NOTE — Progress Notes (Addendum)
ALIVIYAH, MALANGA ([id number]) Visit Report for 01/04/2022 Arrival Information Details Patient Name: Date of Service: [name] 01/04/2022 1:00 PM Medical Record Number: [id number] Patient Account Number: [id number] Date of Birth/Sex: Treating RN: [birth year] (61 y.o. [name], [name] Primary Care Iliyana Convey: [name] Other Clinician: [name] Referring Whitman Meinhardt: Treating Florence Antonelli/Extender: Mathews Argyle in Treatment: 8 Visit Information History Since Last Visit All ordered tests and consults were completed: Yes Patient Arrived: Ambulatory Added or deleted any medications: No Arrival Time: 12:11 Any new allergies or adverse reactions: No Accompanied By: None Had a fall or experienced change in No Transfer Assistance: None activities of daily living that may affect Patient Identification Verified: Yes risk of falls: Secondary Verification Process Completed: Yes Signs or symptoms of abuse/neglect since last visito No Patient Requires Transmission-Based Precautions: No Hospitalized since last visit: No Patient Has Alerts: No Implantable device outside of the clinic excluding No cellular tissue based products placed in the center since last visit: Pain Present Now: No Electronic Signature(s) Signed: 01/04/2022 2:46:01 PM By: [name] EMT Entered By: [name] on 01/04/2022 14:46:01 -------------------------------------------------------------------------------- Encounter Discharge Information Details Patient Name: Date of Service: [name] 01/04/2022 1:00 PM Medical Record Number: [id number] Patient Account Number: [id number] Date of Birth/Sex: Treating RN: [birth year] (61 y.o. [name] Primary Care Hollis Oh: [name] Other Clinician: [name] Referring Brookelynn Hamor: Treating Moishy Laday/Extender: Mathews Argyle in Treatment: 8 Encounter Discharge Information  Items Discharge Condition: Stable Ambulatory Status: Ambulatory Discharge Destination: Home Transportation: Private Auto Accompanied By: None Schedule Follow-up Appointment: Yes Clinical Summary of Care: Electronic Signature(s) Signed: 01/04/2022 2:51:36 PM By: [name] EMT Entered By: [name] on 01/04/2022 14:51:35 -------------------------------------------------------------------------------- Vitals Details Patient Name: Date of Service: [name] 01/04/2022 1:00 PM Medical Record Number: [id number] Patient Account Number: [id number] Date of Birth/Sex: Treating RN: 1960/09/01 (60 y.o. [name], [name] Primary Care Edward Trevino: [name] Other Clinician: [name] Referring Tasheema Perrone: Treating Eshal Propps/Extender: [name] Weeks in Treatment: 8 Vital Signs Time Taken: 12:19 Temperature (F): 98.4 Height (in): 60 Pulse (bpm): 104 Weight (lbs): 88 Respiratory Rate (breaths/min): 16 Body Mass Index (BMI): 17.2 Blood Pressure (mmHg): 90/78 Reference Range: 80 - 120 mg / dl Electronic Signature(s) Signed: 01/04/2022 2:46:33 PM By: [name] EMT Entered By: [name] on 01/04/2022 14:46:33

## 2022-01-07 ENCOUNTER — Encounter (HOSPITAL_BASED_OUTPATIENT_CLINIC_OR_DEPARTMENT_OTHER): Payer: Medicare Other | Admitting: Internal Medicine

## 2022-01-07 DIAGNOSIS — C411 Malignant neoplasm of mandible: Secondary | ICD-10-CM

## 2022-01-07 DIAGNOSIS — M272 Inflammatory conditions of jaws: Secondary | ICD-10-CM

## 2022-01-07 DIAGNOSIS — Z923 Personal history of irradiation: Secondary | ICD-10-CM

## 2022-01-07 DIAGNOSIS — M8788 Other osteonecrosis, other site: Secondary | ICD-10-CM | POA: Diagnosis not present

## 2022-01-07 NOTE — Progress Notes (Addendum)
SAREE, KROGH ([id number]) Visit Report for 01/07/2022 HBO Details Patient Name: Date of Service: [name] 01/07/2022 1:00 PM Medical Record Number: [id number] Patient Account Number: [id number] Date of Birth/Sex: Treating RN: January 03, 1961 (61 y.o. 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, Jaw HBO Treatment Details Treatment Number: 37 Patient Type: Outpatient Chamber Type: Monoplace Chamber Serial #: [id number] Treatment Protocol: 2.5 ATA with 90 minutes oxygen, with two 5 minute air breaks Treatment Details Compression Rate Down: 2.0 psi / minute De-Compression Rate Up: 2.0 psi / minute A breaks and breathing ir Compress Tx Pressure periods Decompress Decompress Begins Reached (leave unused spaces Begins Ends blank) Chamber Pressure (ATA 1 2.5 2.5 2.5 2.5 2.5 - - 2.5 1 ) Clock Time (24 hr) 12:33 12:42 13:12 13:17 13:47 13:52 - - 14:22 14:34 Treatment Length: 121 (minutes) Treatment Segments: 4 Vital Signs Capillary Blood Glucose Reference Range: 80 - 120 mg / dl HBO Diabetic Blood Glucose Intervention Range: <131 mg/dl or >249 mg/dl Time Vitals Blood Respiratory Capillary Blood Glucose Pulse Action Type: Pulse: Temperature: Taken: Pressure: Rate: Glucose (mg/dl): Meter #: Oximetry (%) Taken: Pre 12:25 121/81 95 16 98 Post 14:38 110/73 61 14 97.7 Treatment Response Treatment Toleration: Well Treatment Completion Status: Treatment Completed without Adverse Event Physician HBO Attestation: I certify that I supervised this HBO treatment in accordance with Medicare guidelines. A trained  emergency response team is readily available per Yes hospital policies and procedures. Continue HBOT as ordered. Yes Electronic Signature(s) Signed: 01/07/2022 4:25:55 PM By: [name] DO Previous Signature: 01/07/2022 3:09:49 PM Version By: [name] EMT Entered By: [name] on 01/07/2022 16:25:09 -------------------------------------------------------------------------------- HBO Safety Checklist Details Patient Name: Date of Service: [name] 01/07/2022 1:00 PM Medical Record Number: [id number] Patient Account Number: [id number] Date of Birth/Sex: Treating RN: 12/25/1960 (61 y.o. [name] Primary Care Vanessia Bokhari: [name] Other Clinician: [name] Referring Alethea Terhaar: Treating Natanya Holecek/Extender: [name] Weeks in Treatment: 9 HBO Safety Checklist Items Safety Checklist Consent Form Signed Patient voided / foley secured and emptied When did you last eato 1145 Last dose of injectable or oral agent NA Ostomy pouch emptied and vented if applicable NA All implantable devices assessed, documented and approved NA Intravenous access site secured and place Power [employer] secured Linens and cotton and cotton/polyester blend (less than 51% polyester) Personal oil-based products / skin lotions / body lotions removed Wigs or hairpieces removed NA Smoking or tobacco materials removed Books / newspapers / magazines / loose paper removed Cologne, aftershave, perfume and deodorant removed Jewelry removed (may wrap wedding band) NA Make-up removed Hair care products removed Battery operated devices (external) removed Heating patches and chemical warmers removed Titanium eyewear removed NA Nail polish cured greater than 10 hours 12/31/2021 Casting material cured greater than 10 hours NA Hearing aids removed NA Loose dentures or partials removed NA Prosthetics have been removed NA Patient  demonstrates correct use of air break device (if applicable) Patient concerns have been addressed Patient grounding bracelet on and cord attached to chamber Specifics for Inpatients (complete in addition to above) Medication sheet sent with patient NA Intravenous medications needed or due during therapy sent with patient NA Drainage  tubes (e.g. nasogastric tube or chest tube secured and vented) NA Endotracheal or Tracheotomy tube secured NA Cuff deflated of air and inflated with saline NA Airway suctioned NA Notes The safety checklist was done before the treatment was started. Electronic Signature(s) Signed: 01/07/2022 2:18:29 PM By: [name] EMT Entered By: [name] on 01/07/2022 14:18:29

## 2022-01-07 NOTE — Progress Notes (Addendum)
[name], [name] ([id number]) Visit Report for 01/07/2022 Arrival Information Details Patient Name: Date of Service: [name] 01/07/2022 1:00 PM Medical Record Number: [id number] Patient Account Number: [id number] Date of Birth/Sex: Treating RN: [birth year] (61 y.o. [name] Primary Care Kacelyn Rowzee: [name] Other Clinician: [name] Referring Alana Dayton: Treating Fouad Taul/Extender: [name] in Treatment: 9 Visit Information History Since Last Visit All ordered tests and consults were completed: Yes Patient Arrived: Ambulatory Added or deleted any medications: No Arrival Time: 12:18 Any new allergies or adverse reactions: No Accompanied By: None Had a fall or experienced change in No Transfer Assistance: None activities of daily living that may affect Patient Identification Verified: Yes risk of falls: Secondary Verification Process Completed: Yes Signs or symptoms of abuse/neglect since last visito No Patient Requires Transmission-Based Precautions: No Hospitalized since last visit: No Patient Has Alerts: No Implantable device outside of the clinic excluding No cellular tissue based products placed in the center since last visit: Pain Present Now: No Electronic Signature(s) Signed: 01/07/2022 2:03:06 PM By: [name] EMT Entered By: [name] on 01/07/2022 14:03:06 -------------------------------------------------------------------------------- Encounter Discharge Information Details Patient Name: Date of Service: [name] 01/07/2022 1:00 PM Medical Record Number: [id number] Patient Account Number: [id number] Date of Birth/Sex: Treating RN: 1961-06-30 (61 y.o. [name] Primary Care Dorenda Pfannenstiel: [name] Other Clinician: [name] Referring Jayne Peckenpaugh: Treating Christmas Faraci/Extender: [name] in Treatment: 9 Encounter Discharge  Information Items Discharge Condition: Stable Ambulatory Status: Ambulatory Discharge Destination: Home Transportation: Private Auto Accompanied By: None Schedule Follow-up Appointment: Yes Clinical Summary of Care: Electronic Signature(s) Signed: 01/07/2022 3:10:58 PM By: [name] EMT Entered By: [name] on 01/07/2022 15:10:58 -------------------------------------------------------------------------------- Vitals Details Patient Name: Date of Service: [name] 01/07/2022 1:00 PM Medical Record Number: [id number] Patient Account Number: [id number] Date of Birth/Sex: Treating RN: 01/13/1961 (61 y.o. [name] Primary Care Jaishon Krisher: [name] Other Clinician: [name] Referring Meyer Arora: Treating Mehmet Scally/Extender: [name] Weeks in Treatment: 9 Vital Signs Time Taken: 12:25 Temperature (F): 98.0 Height (in): 60 Pulse (bpm): 95 Weight (lbs): 88 Respiratory Rate (breaths/min): 16 Body Mass Index (BMI): 17.2 Blood Pressure (mmHg): 121/81 Reference Range: 80 - 120 mg / dl Electronic Signature(s) Signed: 01/07/2022 2:03:54 PM By: [name] EMT Entered By: [name] on 01/07/2022 14:03:54

## 2022-01-07 NOTE — Progress Notes (Signed)
LARIN, DEPAOLI ([id number]) Visit Report for 01/07/2022 Problem List Details Patient Name: Date of Service: [name] 01/07/2022 1:00 PM Medical Record Number: [id number] Patient Account Number: [id number] Date of Birth/Sex: Treating RN: [birth year] (61 y.o. 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 01/07/2022 Medical Record Number: [id number] Patient Account Number: [id number] Date of Birth/Sex: Treating RN: 04/13/1961 (61 y.o. 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

## 2022-01-08 ENCOUNTER — Encounter (HOSPITAL_BASED_OUTPATIENT_CLINIC_OR_DEPARTMENT_OTHER): Payer: Medicare Other | Admitting: Internal Medicine

## 2022-01-08 DIAGNOSIS — C411 Malignant neoplasm of mandible: Secondary | ICD-10-CM | POA: Diagnosis not present

## 2022-01-08 DIAGNOSIS — M8788 Other osteonecrosis, other site: Secondary | ICD-10-CM | POA: Diagnosis not present

## 2022-01-08 DIAGNOSIS — M272 Inflammatory conditions of jaws: Secondary | ICD-10-CM | POA: Diagnosis not present

## 2022-01-08 DIAGNOSIS — Z923 Personal history of irradiation: Secondary | ICD-10-CM | POA: Diagnosis not present

## 2022-01-08 NOTE — Progress Notes (Signed)
Theresa, Dillon ([id number]) Visit Report for 01/08/2022 HBO Safety Checklist Details Patient Name: Date of Service: [name] 01/08/2022 1:00 PM Medical Record Number: [id number] Patient Account Number: [id number] Date of Birth/Sex: Treating RN: 02-Sep-1960 (61 y.o. [name] Primary Care [name]: [name] Other Clinician: Donavan Burnet Referring [name]: Treating Sage Kopera/Extender: [name] Weeks in Treatment: 9 HBO Safety Checklist Items Safety Checklist Consent Form Signed Patient voided / foley secured and emptied When did you last eato 1100 Last dose of injectable or oral agent n/a Ostomy pouch emptied and vented if applicable NA All implantable devices assessed, documented and approved NA Intravenous access site secured and place NA Valuables secured Linens and cotton and cotton/polyester blend (less than 51% polyester) Personal oil-based products / skin lotions / body lotions removed Wigs or hairpieces removed NA Smoking or tobacco materials removed NA Books / newspapers / magazines / loose paper removed Cologne, aftershave, perfume and deodorant removed Jewelry removed (may wrap wedding band) Make-up removed Hair care products removed Battery operated devices (external) removed Heating patches and chemical warmers removed Titanium eyewear removed NA Nail polish cured greater than 10 hours 12/31/21 Casting material cured greater than 10 hours NA Hearing aids removed NA Loose dentures or partials removed NA Prosthetics have been removed NA Patient demonstrates correct use of air break device (if applicable) Patient concerns have been addressed Patient grounding bracelet on and cord attached to chamber Specifics for Inpatients (complete in addition to above) Medication sheet sent with patient NA Intravenous medications needed or due during therapy sent with patient NA Drainage tubes (e.g.  nasogastric tube or chest tube secured and vented) NA Endotracheal or Tracheotomy tube secured NA Cuff deflated of air and inflated with saline NA Airway suctioned NA Notes Paper version used prior to treatment. Electronic Signature(s) Signed: 01/08/2022 2:08:59 PM By: Donavan Burnet CHT EMT BS , , Entered By: Donavan Burnet on 01/08/2022 14:08:58

## 2022-01-08 NOTE — Progress Notes (Addendum)
[name], [name] ([id number]) Visit Report for 01/08/2022 Arrival Information Details Patient Name: Date of Service: Theresa LLA [name] 01/08/2022 1:00 PM Medical Record Number: [id number] Patient Account Number: [id number] Date of Birth/Sex: Treating RN: [birth year] (61 y.o. [name] Primary Care Shawntina Diffee: [name] Other Clinician: Donavan Burnet Referring Rexanna Louthan: Treating Eryx Zane/Extender: [name] in Treatment: 9 Visit Information History Since Last Visit All ordered tests and consults were completed: Yes Patient Arrived: Ambulatory Added or deleted any medications: No Arrival Time: 12:22 Any new allergies or adverse reactions: No Accompanied By: self Had a fall or experienced change in No Transfer Assistance: None activities of daily living that may affect Patient Identification Verified: Yes risk of falls: Secondary Verification Process Completed: Yes Signs or symptoms of abuse/neglect since last visito No Patient Requires Transmission-Based Precautions: No Hospitalized since last visit: No Patient Has Alerts: No Implantable device outside of the clinic excluding No cellular tissue based products placed in the center since last visit: Pain Present Now: No Electronic Signature(s) Signed: 01/08/2022 2:07:16 PM By: Donavan Burnet CHT EMT BS , , Entered By: Donavan Burnet on 01/08/2022 14:07:16 -------------------------------------------------------------------------------- Encounter Discharge Information Details Patient Name: Date of Service: Theresa [name] 01/08/2022 1:00 PM Medical Record Number: [id number] Patient Account Number: [id number] Date of Birth/Sex: Treating RN: May 21, 1961 (61 y.o. [name] Primary Care Bonny Egger: [name] Other Clinician: Donavan Burnet Referring Courtenay Creger: Treating Kyngston Pickelsimer/Extender: [name] in Treatment:  9 Encounter Discharge Information Items Discharge Condition: Stable Ambulatory Status: Ambulatory Discharge Destination: Home Transportation: Private Auto Accompanied By: self Schedule Follow-up Appointment: No Clinical Summary of Care: Electronic Signature(s) Signed: 01/08/2022 3:55:48 PM By: Donavan Burnet CHT EMT BS , , Entered By: Donavan Burnet on 01/08/2022 15:55:48 -------------------------------------------------------------------------------- Vitals Details Patient Name: Date of Service: Theresa LLA [name] 01/08/2022 1:00 PM Medical Record Number: [id number] Patient Account Number: [id number] Date of Birth/Sex: Treating RN: [birth year] (61 y.o. [name] Primary Care Verdell Kincannon: [name] Other Clinician: Donavan Burnet Referring Janine Reller: Treating Rihaan Barrack/Extender: [name] Weeks in Treatment: 9 Vital Signs Time Taken: 12:27 Temperature (F): 98.4 Height (in): 60 Pulse (bpm): 93 Weight (lbs): 88 Respiratory Rate (breaths/min): 18 Body Mass Index (BMI): 17.2 Blood Pressure (mmHg): 113/78 Reference Range: 80 - 120 mg / dl Electronic Signature(s) Signed: 01/08/2022 2:07:36 PM By: Donavan Burnet CHT EMT BS , , Entered By: Donavan Burnet on 01/08/2022 14:07:36

## 2022-01-09 ENCOUNTER — Encounter (HOSPITAL_BASED_OUTPATIENT_CLINIC_OR_DEPARTMENT_OTHER): Payer: Medicare Other | Admitting: General Surgery

## 2022-01-09 DIAGNOSIS — M272 Inflammatory conditions of jaws: Secondary | ICD-10-CM | POA: Diagnosis not present

## 2022-01-09 NOTE — Progress Notes (Addendum)
[name], [name] ([id number]) Visit Report for 01/09/2022 Arrival Information Details Patient Name: Date of Service: Theresa LLA [name] 01/09/2022 1:00 PM Medical Record Number: [id number] Patient Account Number: [id number] Date of Birth/Sex: Treating RN: 08-17-1960 (61 y.o. [name] Primary Care Shaney Deckman: [name] Other Clinician: [name] Referring Sheletha Bow: Treating Jairy Angulo/Extender: Mathews Argyle in Treatment: 9 Visit Information History Since Last Visit All ordered tests and consults were completed: Yes Patient Arrived: Ambulatory Added or deleted any medications: No Arrival Time: 12:19 Any new allergies or adverse reactions: No Accompanied By: None Had a fall or experienced change in No Transfer Assistance: None activities of daily living that may affect Patient Identification Verified: Yes risk of falls: Secondary Verification Process Completed: Yes Signs or symptoms of abuse/neglect since last visito No Patient Requires Transmission-Based Precautions: No Hospitalized since last visit: No Patient Has Alerts: No Implantable device outside of the clinic excluding No cellular tissue based products placed in the center since last visit: Pain Present Now: No Electronic Signature(s) Signed: 01/09/2022 1:21:06 PM By: [name] EMT Entered By: [name] on 01/09/2022 13:21:06 -------------------------------------------------------------------------------- Encounter Discharge Information Details Patient Name: Date of Service: Theresa [name] 01/09/2022 1:00 PM Medical Record Number: [id number] Patient Account Number: [id number] Date of Birth/Sex: Treating RN: April 08, 1961 (61 y.o. [name] Primary Care Sanav Remer: [name] Other Clinician: [name] Referring Nelsie Domino: Treating Amilyah Nack/Extender: Mathews Argyle in Treatment: 9 Encounter Discharge Information  Items Discharge Condition: Stable Ambulatory Status: Ambulatory Discharge Destination: Home Transportation: Private Auto Accompanied By: None Schedule Follow-up Appointment: Yes Clinical Summary of Care: Electronic Signature(s) Signed: 01/09/2022 2:47:51 PM By: [name] EMT Entered By: [name] on 01/09/2022 14:47:51 -------------------------------------------------------------------------------- Vitals Details Patient Name: Date of Service: Theresa LLA [name] 01/09/2022 1:00 PM Medical Record Number: [id number] Patient Account Number: [id number] Date of Birth/Sex: Treating RN: 10/11/1960 (61 y.o. [name] Primary Care Sera Hitsman: [name] Other Clinician: [name] Referring Blaike Newburn: Treating Corean Yoshimura/Extender: [name] Weeks in Treatment: 9 Vital Signs Time Taken: 12:27 Temperature (F): 97.7 Height (in): 60 Pulse (bpm): 98 Weight (lbs): 88 Respiratory Rate (breaths/min): 16 Body Mass Index (BMI): 17.2 Blood Pressure (mmHg): 95/79 Reference Range: 80 - 120 mg / dl Electronic Signature(s) Signed: 01/09/2022 1:21:33 PM By: [name] EMT Entered By: [name] on 01/09/2022 13:21:32

## 2022-01-09 NOTE — Progress Notes (Signed)
[name], [name] ([id number]) Visit Report for 01/09/2022 Problem List Details Patient Name: Date of Service: [name] 01/09/2022 1:00 PM Medical Record Number: [id number] Patient Account Number: [id number] Date of Birth/Sex: Treating RN: December 05, 1960 (61 y.o. 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 01/09/2022 Medical Record Number: [id number] Patient Account Number: [id number] Date of Birth/Sex: Treating RN: 01-16-1961 (61 y.o. 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

## 2022-01-09 NOTE — Progress Notes (Addendum)
VERNIS, CABACUNGAN ([id number]) Visit Report for 01/09/2022 HBO Details Patient Name: Date of Service: CA Theresa Theresa Alexander D. 01/09/2022 1:00 PM Medical Record Number: [id number] Patient Account Number: [id number] Date of Birth/Sex: Treating RN: [birth year] (61 y.o. 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, Jaw HBO Treatment Details Treatment Number: 39 Patient Type: Outpatient Chamber Type: Monoplace Chamber Serial #: [id number] Treatment Protocol: 2.5 ATA with 90 minutes oxygen, with two 5 minute air breaks Treatment Details Compression Rate Down: 2.0 psi / minute De-Compression Rate Up: A breaks and breathing ir Compress Tx Pressure periods Decompress Decompress Begins Reached (leave unused spaces Begins Ends blank) Chamber Pressure (ATA 1 2.5 2.5 2.5 2.5 2.5 - - 2.5 1 ) Clock Time (24 hr) 12:34 12:45 13:15 13:20 13:50 13:56 - - 14:26 14:39 Treatment Length: 125 (minutes) Treatment Segments: 4 Vital Signs Capillary Blood Glucose Reference Range: 80 - 120 mg / dl HBO Diabetic Blood Glucose Intervention Range: <131 mg/dl or >249 mg/dl Time Vitals Blood Respiratory Capillary Blood Glucose Pulse Action Type: Pulse: Temperature: Taken: Pressure: Rate: Glucose (mg/dl): Meter #: Oximetry (%) Taken: Pre 12:27 95/79 98 16 97.7 Post 14:44 115/68 68 14 98.3 Treatment Response Treatment Toleration: Well Treatment Completion Status: Treatment Completed without Adverse Event Physician HBO Attestation: I certify that I supervised this HBO treatment in accordance with Medicare guidelines. A trained emergency response team  is readily available per Yes hospital policies and procedures. Continue HBOT as ordered. Yes Electronic Signature(s) Signed: 01/09/2022 2:52:21 PM By: Theresa Maudlin MD FACS Previous Signature: 01/09/2022 2:46:25 PM Version By: [name] EMT Previous Signature: 01/09/2022 2:23:43 PM Version By: [name] EMT Previous Signature: 01/09/2022 1:24:18 PM Version By: [name] EMT Entered By: [name] on 01/09/2022 14:52:21 -------------------------------------------------------------------------------- HBO Safety Checklist Details Patient Name: Date of Service: [name] 01/09/2022 1:00 PM Medical Record Number: [id number] Patient Account Number: [id number] Date of Birth/Sex: Treating RN: 21-Sep-1960 (61 y.o. [name] Primary Care [name]: [name] Other Clinician: Valeria Dillon Referring [name]: Treating [name]/Extender: [name] Weeks in Treatment: 9 HBO Safety Checklist Items Safety Checklist Consent Form Signed Patient voided / foley secured and emptied When did you last eato 1130 Last dose of injectable or oral agent NA Ostomy pouch emptied and vented if applicable NA All implantable devices assessed, documented and approved NA Intravenous access site secured and place Power [employer] blend (less than 51% polyester) Personal oil-based products / skin lotions / body lotions removed Wigs or hairpieces removed NA Smoking or tobacco materials removed Books / newspapers / magazines / loose paper removed Cologne, aftershave, perfume and deodorant removed Jewelry removed (may wrap wedding band) NA Make-up removed Hair care products removed Battery operated devices (external) removed Heating patches and chemical warmers removed Titanium eyewear removed NA Nail polish cured greater than 10 hours 12/31/2021 Casting material cured greater than 10  hours NA Hearing aids removed NA Loose dentures or partials removed NA Prosthetics have been removed NA Patient demonstrates correct use of air break device (if applicable) Patient concerns have been addressed Patient grounding bracelet on and cord attached to chamber Specifics for Inpatients (complete in addition to above) Medication  sheet sent with patient NA Intravenous medications needed or due during therapy sent with patient NA Drainage tubes (e.g. nasogastric tube or chest tube secured and vented) NA Endotracheal or Tracheotomy tube secured NA Cuff deflated of air and inflated with saline NA Airway suctioned NA Notes The safety checklist was done before the patient treatment was started. Electronic Signature(s) Signed: 01/09/2022 1:23:39 PM By: [name] EMT Entered By: [name] on 01/09/2022 13:23:39

## 2022-01-09 NOTE — Progress Notes (Signed)
[name], [name] ([id number]) Visit Report for 01/08/2022 SuperBill Details Patient Name: Date of Service: [name] 01/08/2022 Medical Record Number: [id number] Patient Account Number: [id number] Date of Birth/Sex: Treating RN: [birth year] (61 y.o. 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

## 2022-01-10 ENCOUNTER — Encounter (HOSPITAL_BASED_OUTPATIENT_CLINIC_OR_DEPARTMENT_OTHER): Payer: Medicare Other | Admitting: General Surgery

## 2022-01-11 ENCOUNTER — Encounter (HOSPITAL_BASED_OUTPATIENT_CLINIC_OR_DEPARTMENT_OTHER): Payer: Medicare Other | Admitting: Internal Medicine

## 2022-01-11 DIAGNOSIS — M272 Inflammatory conditions of jaws: Secondary | ICD-10-CM | POA: Diagnosis not present

## 2022-01-11 NOTE — Progress Notes (Signed)
[name], [name] ([id number]) Visit Report for 01/11/2022 Problem List Details Patient Name: Date of Service: [name] 01/11/2022 8:00 A M Medical Record Number: [id number] Patient Account Number: [id number] Date of Birth/Sex: Treating RN: 02-12-1961 (61 y.o. 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 01/11/2022 Medical Record Number: [id number] Patient Account Number: [id number] Date of Birth/Sex: Treating RN: [birth year] (61 y.o. 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

## 2022-01-11 NOTE — Progress Notes (Addendum)
[name], [name] ([id number]) Visit Report for 01/11/2022 Arrival Information Details Patient Name: Date of Service: Theresa LLA Theresa Alexander D. 01/11/2022 8:00 A M Medical Record Number: [id number] Patient Account Number: [id number] Date of Birth/Sex: Treating RN: Sep 24, 1960 (61 y.o. [name] Primary Care [name]: [name] Other Clinician: Valeria Dillon Referring [name]: Treating [name]/Extender: Rushie Dillon in Treatment: 9 Visit Information History Since Last Visit All ordered tests and consults were completed: Yes Patient Arrived: Ambulatory Added or deleted any medications: No Arrival Time: 07:35 Any new allergies or adverse reactions: No Accompanied By: None Had a fall or experienced change in No Transfer Assistance: None activities of daily living that may affect Patient Identification Verified: Yes risk of falls: Secondary Verification Process Completed: Yes Signs or symptoms of abuse/neglect since last visito No Patient Requires Transmission-Based Precautions: No Hospitalized since last visit: No Patient Has Alerts: No Implantable device outside of the clinic excluding No cellular tissue based products placed in the center since last visit: Pain Present Now: No Electronic Signature(s) Signed: 01/11/2022 8:17:15 AM By: [name] EMT Entered By: [name] on 01/11/2022 08:17:15 -------------------------------------------------------------------------------- Encounter Discharge Information Details Patient Name: Date of Service: Theresa [name] 01/11/2022 8:00 A M Medical Record Number: [id number] Patient Account Number: [id number] Date of Birth/Sex: Treating RN: 01-06-1961 (62 y.o. [name] Primary Care [name]: [name] Other Clinician: Valeria Dillon Referring [name]: Treating [name]/Extender: Rushie Dillon in Treatment: 9 Encounter Discharge Information  Items Discharge Condition: Stable Ambulatory Status: Ambulatory Discharge Destination: Home Transportation: Private Auto Accompanied By: None Schedule Follow-up Appointment: Yes Clinical Summary of Care: Electronic Signature(s) Signed: 01/11/2022 10:58:11 AM By: [name] EMT Entered By: [name] on 01/11/2022 10:58:11 -------------------------------------------------------------------------------- Vitals Details Patient Name: Date of Service: Theresa LLA Theresa Alexander D. 01/11/2022 8:00 A M Medical Record Number: [id number] Patient Account Number: [id number] Date of Birth/Sex: Treating RN: 08/31/1960 (61 y.o. [name] Primary Care [name]: [name] Other Clinician: Valeria Dillon Referring [name]: Treating [name]/Extender: Rushie Dillon in Treatment: 9 Vital Signs Time Taken: 07:48 Temperature (F): 98.2 Height (in): 60 Pulse (bpm): 80 Weight (lbs): 88 Respiratory Rate (breaths/min): 16 Body Mass Index (BMI): 17.2 Blood Pressure (mmHg): 103/79 Reference Range: 80 - 120 mg / dl Electronic Signature(s) Signed: 01/11/2022 8:17:54 AM By: [name] EMT Entered By: [name] on 01/11/2022 08:17:54

## 2022-01-11 NOTE — Progress Notes (Addendum)
[name], [name] ([id number]) Visit Report for 01/11/2022 HBO Details Patient Name: Date of Service: [name] 01/11/2022 8:00 A M Medical Record Number: [id number] Patient Account Number: [id number] Date of Birth/Sex: Treating RN: [birth year] (61 y.o. 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, Jaw HBO Treatment Details Treatment Number: 40 Patient Type: Outpatient Chamber Type: Monoplace Chamber Serial #: [id number] Treatment Protocol: 2.5 ATA with 90 minutes oxygen, with two 5 minute air breaks Treatment Details Compression Rate Down: 2.0 psi / minute De-Compression Rate Up: 2.0 psi / minute A breaks and breathing ir Compress Tx Pressure periods Decompress Decompress Begins Reached (leave unused spaces Begins Ends blank) Chamber Pressure (ATA 1 2.5 2.5 2.5 2.5 2.5 - - 2.5 1 ) Clock Time (24 hr) 07:54 08:06 08:36 08:41 09:11 09:16 - - 09:46 09:58 Treatment Length: 124 (minutes) Treatment Segments: 4 Vital Signs Capillary Blood Glucose Reference Range: 80 - 120 mg / dl HBO Diabetic Blood Glucose Intervention Range: <131 mg/dl or >249 mg/dl Time Vitals Blood Respiratory Capillary Blood Glucose Pulse Action Type: Pulse: Temperature: Taken: Pressure: Rate: Glucose (mg/dl): Meter #: Oximetry (%) Taken: Pre 07:48 103/79 80 16 98.2 Post 10:03 114/82 70 16 98.2 Treatment Response Treatment Toleration: Well Treatment Completion Status: Treatment Completed without Adverse Event Cinde Ebert Notes No concerns with treatment given Physician HBO Attestation: I certify that I supervised this HBO treatment in  accordance with Medicare guidelines. A trained emergency response team is readily available per Yes hospital policies and procedures. Continue HBOT as ordered. Yes Electronic Signature(s) Signed: 01/11/2022 4:59:49 PM By: Linton Ham MD Previous Signature: 01/11/2022 10:58:46 AM Version By: [name] EMT Previous Signature: 01/11/2022 10:57:09 AM Version By: [name] EMT Previous Signature: 01/11/2022 8:21:33 AM Version By: [name] EMT Entered By: Linton Ham on 01/11/2022 16:56:02 -------------------------------------------------------------------------------- HBO Safety Checklist Details Patient Name: Date of Service: [name] 01/11/2022 8:00 A M Medical Record Number: [id number] Patient Account Number: [id number] Date of Birth/Sex: Treating RN: 03-Jun-1961 (61 y.o. [name] Primary Care Develle Sievers: [name] Other Clinician: [name] Referring Emmalena Canny: Treating Ginia Dillon/Extender: Rushie Chestnut in Treatment: 9 HBO Safety Checklist Items Safety Checklist Consent Form Signed Patient voided / foley secured and emptied When did you last eato 0600 Last dose of injectable or oral agent NA Ostomy pouch emptied and vented if applicable NA All implantable devices assessed, documented and approved Power Port Intravenous access site secured and place NA not in use Valuables secured Linens and cotton and cotton/polyester blend (less than 51% polyester) Personal oil-based products / skin lotions / body lotions removed Wigs or hairpieces removed NA Smoking or tobacco materials removed Books / newspapers / magazines / loose paper removed Cologne, aftershave, perfume and deodorant removed Jewelry removed (may wrap wedding band) NA Make-up removed Hair care products removed Battery operated devices (external) removed Heating patches and chemical warmers removed Titanium eyewear removed NA Nail polish  cured greater than 10 hours 12/31/2021 Casting material cured greater than 10 hours NA Hearing aids removed NA Loose dentures or partials removed NA Prosthetics have been removed NA Patient demonstrates correct use of air break device (if applicable) Patient concerns have been addressed Patient grounding bracelet  on and cord attached to chamber Specifics for Inpatients (complete in addition to above) Medication sheet sent with patient NA Intravenous medications needed or due during therapy sent with patient NA Drainage tubes (e.g. nasogastric tube or chest tube secured and vented) NA Endotracheal or Tracheotomy tube secured NA Cuff deflated of air and inflated with saline NA Airway suctioned NA Notes The safety checklist was done before the patient treatment was started. Electronic Signature(s) Signed: 01/11/2022 1:23:02 PM By: [name] EMT Previous Signature: 01/11/2022 8:21:01 AM Version By: [name] EMT Entered By: [name] on 01/11/2022 13:23:02

## 2022-01-14 ENCOUNTER — Encounter (HOSPITAL_BASED_OUTPATIENT_CLINIC_OR_DEPARTMENT_OTHER): Payer: Medicare Other | Admitting: Internal Medicine

## 2022-01-14 DIAGNOSIS — C411 Malignant neoplasm of mandible: Secondary | ICD-10-CM | POA: Diagnosis not present

## 2022-01-14 DIAGNOSIS — M272 Inflammatory conditions of jaws: Secondary | ICD-10-CM | POA: Diagnosis not present

## 2022-01-14 DIAGNOSIS — Z923 Personal history of irradiation: Secondary | ICD-10-CM | POA: Diagnosis not present

## 2022-01-14 DIAGNOSIS — M8788 Other osteonecrosis, other site: Secondary | ICD-10-CM | POA: Diagnosis not present

## 2022-01-14 NOTE — Progress Notes (Addendum)
[name], [name] ([id number]) Visit Report for 01/14/2022 Arrival Information Details Patient Name: Date of Service: [name] 01/14/2022 1:00 PM Medical Record Number: [id number] Patient Account Number: [id number] Date of Birth/Sex: Treating RN: 04-Feb-1961 (61 y.o. Orvan Falconer Primary Care Maayan Jenning: [name] Other Clinician: Donavan Burnet Referring Ceciley Buist: Treating Acie Custis/Extender: [name] in Treatment: 10 Visit Information History Since Last Visit All ordered tests and consults were completed: Yes Patient Arrived: Ambulatory Added or deleted any medications: No Arrival Time: 12:27 Any new allergies or adverse reactions: No Accompanied By: self Had a fall or experienced change in No Transfer Assistance: None activities of daily living that may affect Patient Identification Verified: Yes risk of falls: Secondary Verification Process Completed: Yes Signs or symptoms of abuse/neglect since last visito No Patient Requires Transmission-Based Precautions: No Hospitalized since last visit: No Patient Has Alerts: No Implantable device outside of the clinic excluding No cellular tissue based products placed in the center since last visit: Pain Present Now: No Electronic Signature(s) Signed: 01/14/2022 1:21:36 PM By: Donavan Burnet CHT EMT BS , , Previous Signature: 01/14/2022 1:01:17 PM Version By: Donavan Burnet CHT EMT BS , , Entered By: Donavan Burnet on 01/14/2022 13:21:36 -------------------------------------------------------------------------------- Encounter Discharge Information Details Patient Name: Date of Service: [name] 01/14/2022 1:00 PM Medical Record Number: [id number] Patient Account Number: [id number] Date of Birth/Sex: Treating RN: 08-Apr-1961 (61 y.o. Orvan Falconer Primary Care Takoda Janowiak: [name] Other Clinician: [name] Referring Roselynne Lortz: Treating  Brigitt Mcclish/Extender: [name] in Treatment: 10 Encounter Discharge Information Items Discharge Condition: Stable Ambulatory Status: Ambulatory Discharge Destination: Home Transportation: Private Auto Accompanied By: None Schedule Follow-up Appointment: Yes Clinical Summary of Care: Electronic Signature(s) Signed: 01/14/2022 3:32:32 PM By: [name] EMT Entered By: [name] on 01/14/2022 15:32:32 -------------------------------------------------------------------------------- Vitals Details Patient Name: Date of Service: [name] 01/14/2022 1:00 PM Medical Record Number: [id number] Patient Account Number: [id number] Date of Birth/Sex: Treating RN: [birth year] (61 y.o. Orvan Falconer Primary Care [name]: [name] Other Clinician: Donavan Burnet Referring Carden Teel: Treating Amyr Sluder/Extender: [name] Weeks in Treatment: 10 Vital Signs Time Taken: 12:31 Temperature (F): 98.1 Height (in): 60 Pulse (bpm): 90 Weight (lbs): 88 Respiratory Rate (breaths/min): 16 Body Mass Index (BMI): 17.2 Blood Pressure (mmHg): 107/74 Reference Range: 80 - 120 mg / dl Electronic Signature(s) Signed: 01/14/2022 1:18:02 PM By: Donavan Burnet CHT EMT BS , , Entered By: Donavan Burnet on 01/14/2022 13:18:02

## 2022-01-14 NOTE — Progress Notes (Signed)
CAROLEEN, STOERMER ([id number]) Visit Report for 01/14/2022 SuperBill Details Patient Name: Date of Service: [name] 01/14/2022 Medical Record Number: [id number] Patient Account Number: [id number] Date of Birth/Sex: Treating RN: [birth year] (61 y.o. 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

## 2022-01-14 NOTE — Progress Notes (Addendum)
Theresa, Dillon ([id number]) Visit Report for 01/14/2022 HBO Details Patient Name: Date of Service: [name] 01/14/2022 1:00 PM Medical Record Number: [id number] Patient Account Number: [id number] Date of Birth/Sex: Treating RN: [birth year] (61 y.o. 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, Jaw HBO Treatment Details Treatment Number: 41 Patient Type: Outpatient Chamber Type: Monoplace Chamber Serial #: [id number] Treatment Protocol: 2.5 ATA with 90 minutes oxygen, with two 5 minute air breaks Treatment Details Compression Rate Down: 2.0 psi / minute De-Compression Rate Up: 2.0 psi / minute A breaks and breathing ir Compress Tx Pressure periods Decompress Decompress Begins Reached (leave unused spaces Begins Ends blank) Chamber Pressure (ATA 1 2.5 2.5 2.5 2.5 2.5 - - 2.5 1 ) Clock Time (24 hr) 12:44 12:56 13:26 13:31 14:01 14:06 - - 14:36 14:47 Treatment Length: 123 (minutes) Treatment Segments: 4 Vital Signs Capillary Blood Glucose Reference Range: 80 - 120 mg / dl HBO Diabetic Blood Glucose Intervention Range: <131 mg/dl or >249 mg/dl Time Vitals Blood Respiratory Capillary Blood Glucose Pulse Action Type: Pulse: Temperature: Taken: Pressure: Rate: Glucose (mg/dl): Meter #: Oximetry (%) Taken: Pre 12:31 107/74 90 16 98.1 Post 14:51 135/89 72 16 98.1 Treatment Response Treatment Toleration: Well Treatment Completion Status: Treatment Completed without Adverse Event Physician HBO Attestation: I certify that I supervised this HBO treatment in accordance with Medicare guidelines. A trained  emergency response team is readily available per Yes hospital policies and procedures. Continue HBOT as ordered. Yes Electronic Signature(s) Signed: 01/14/2022 3:52:49 PM By: [name] DO Previous Signature: 01/14/2022 3:43:01 PM Version By: [name] EMT Previous Signature: 01/14/2022 3:31:29 PM Version By: [name] EMT Previous Signature: 01/14/2022 1:19:56 PM Version By: Donavan Burnet CHT EMT BS , , Entered By: [name] on 01/14/2022 15:52:07 -------------------------------------------------------------------------------- HBO Safety Checklist Details Patient Name: Date of Service: [name] 01/14/2022 1:00 PM Medical Record Number: [id number] Patient Account Number: [id number] Date of Birth/Sex: Treating RN: 1961/02/14 (61 y.o. Orvan Falconer Primary Care Ethal Gotay: [name] Other Clinician: Donavan Burnet Referring Charlott Calvario: Treating Breon Rehm/Extender: [name] Weeks in Treatment: 10 HBO Safety Checklist Items Safety Checklist Consent Form Signed Patient voided / foley secured and emptied When did you last eato 1000 Last dose of injectable or oral agent n/a Ostomy pouch emptied and vented if applicable NA All implantable devices assessed, documented and approved Power port Intravenous access site secured and place NA Valuables secured Linens and cotton and cotton/polyester blend (less than 51% polyester) Personal oil-based products / skin lotions / body lotions removed Wigs or hairpieces removed NA Smoking or tobacco materials removed NA Books / newspapers / magazines / loose paper removed Cologne, aftershave, perfume and deodorant removed Jewelry removed (may wrap wedding band) Make-up removed Hair care products removed Battery operated devices (external) removed Heating patches and chemical warmers removed Titanium eyewear removed NA Nail polish cured greater than 10 hours Casting  material cured greater than 10 hours NA Hearing aids removed NA Loose dentures or partials removed NA Prosthetics have been removed NA Patient demonstrates correct use of air break device (if applicable) Patient concerns have been addressed Patient grounding bracelet on and cord attached to chamber Specifics for Inpatients (  complete in addition to above) Medication sheet sent with patient NA Intravenous medications needed or due during therapy sent with patient NA Drainage tubes (e.g. nasogastric tube or chest tube secured and vented) NA Endotracheal or Tracheotomy tube secured NA Cuff deflated of air and inflated with saline NA Airway suctioned NA Electronic Signature(s) Signed: 01/14/2022 1:19:30 PM By: Donavan Burnet CHT EMT BS , , Entered By: Donavan Burnet on 01/14/2022 13:19:30

## 2022-01-15 ENCOUNTER — Encounter (HOSPITAL_BASED_OUTPATIENT_CLINIC_OR_DEPARTMENT_OTHER): Payer: Medicare Other | Admitting: Internal Medicine

## 2022-01-15 DIAGNOSIS — M272 Inflammatory conditions of jaws: Secondary | ICD-10-CM | POA: Diagnosis not present

## 2022-01-15 DIAGNOSIS — Z923 Personal history of irradiation: Secondary | ICD-10-CM | POA: Diagnosis not present

## 2022-01-15 DIAGNOSIS — M8788 Other osteonecrosis, other site: Secondary | ICD-10-CM

## 2022-01-15 DIAGNOSIS — C411 Malignant neoplasm of mandible: Secondary | ICD-10-CM | POA: Diagnosis not present

## 2022-01-15 NOTE — Progress Notes (Addendum)
AMBRY, DIX ([id number]) Visit Report for 01/15/2022 HBO Details Patient Name: Date of Service: [name] 01/15/2022 1:00 PM Medical Record Number: [id number] Patient Account Number: [id number] Date of Birth/Sex: Treating RN: 04/13/1961 (61 y.o. 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, Jaw HBO Treatment Details Treatment Number: 42 Patient Type: Outpatient Chamber Type: Monoplace Chamber Serial #: [id number] Treatment Protocol: 2.5 ATA with 90 minutes oxygen, with two 5 minute air breaks Treatment Details Compression Rate Down: 2.0 psi / minute De-Compression Rate Up: 2.0 psi / minute A breaks and breathing ir Compress Tx Pressure periods Decompress Decompress Begins Reached (leave unused spaces Begins Ends blank) Chamber Pressure (ATA 1 2.5 2.5 2.5 2.5 2.5 - - 2.5 1 ) Clock Time (24 hr) 12:45 12:57 13:27 13:32 14:02 14:07 - - 14:37 14:48 Treatment Length: 123 (minutes) Treatment Segments: 4 Vital Signs Capillary Blood Glucose Reference Range: 80 - 120 mg / dl HBO Diabetic Blood Glucose Intervention Range: <131 mg/dl or >249 mg/dl Time Vitals Blood Respiratory Capillary Blood Glucose Pulse Action Type: Pulse: Temperature: Taken: Pressure: Rate: Glucose (mg/dl): Meter #: Oximetry (%) Taken: Pre 12:36 120/73 86 16 98.6 Post 14:51 121/88 69 14 98.2 Treatment Response Treatment Toleration: Well Treatment Completion Status: Treatment Completed without Adverse Event Physician HBO Attestation: I certify that I supervised this HBO treatment in accordance with Medicare guidelines. A trained  emergency response team is readily available per Yes hospital policies and procedures. Continue HBOT as ordered. Yes Electronic Signature(s) Signed: 01/15/2022 4:15:59 PM By: Theresa Shan DO Previous Signature: 01/15/2022 3:03:08 PM Version By: [name] EMT Previous Signature: 01/15/2022 1:19:14 PM Version By: [name] EMT Entered By: [name] on 01/15/2022 15:56:29 -------------------------------------------------------------------------------- HBO Safety Checklist Details Patient Name: Date of Service: [name] 01/15/2022 1:00 PM Medical Record Number: [id number] Patient Account Number: [id number] Date of Birth/Sex: Treating RN: [birth year] (61 y.o. [name], [name] Primary Care [name]: [name] Other Clinician: Valeria Dillon Referring [name]: Treating [name]/Extender: [name] Weeks in Treatment: 10 HBO Safety Checklist Items Safety Checklist Consent Form Signed Patient voided / foley secured and emptied When did you last eato 1000 Last dose of injectable or oral agent NA Ostomy pouch emptied and vented if applicable NA All implantable devices assessed, documented and approved NA Intravenous access site secured and place NA Valuables secured Linens and cotton and cotton/polyester blend (less than 51% polyester) Personal oil-based products / skin lotions / body lotions removed Wigs or hairpieces removed NA Smoking or tobacco materials removed Books / newspapers / magazines / loose paper removed Cologne, aftershave, perfume and deodorant removed Jewelry removed (may wrap wedding band) NA Make-up removed Hair care products removed Battery operated devices (external) removed Heating patches and chemical warmers removed Titanium eyewear removed NA Nail polish cured greater than 10 hours Casting material cured greater than 10 hours NA Hearing aids removed NA Loose dentures or partials  removed NA Prosthetics have been removed NA Patient demonstrates correct use of air break device (if applicable) Patient concerns have been addressed Patient grounding bracelet on and cord attached to chamber Specifics for Inpatients (complete in addition to above) Medication sheet sent with patient NA Intravenous medications needed or  due during therapy sent with patient NA Drainage tubes (e.g. nasogastric tube or chest tube secured and vented) NA Endotracheal or Tracheotomy tube secured NA Cuff deflated of air and inflated with saline NA Airway suctioned NA Notes The safety checklist was done before the treatment was started. Electronic Signature(s) Signed: 01/15/2022 1:18:07 PM By: [name] EMT Entered By: [name] on 01/15/2022 13:18:07

## 2022-01-15 NOTE — Progress Notes (Signed)
PHIONA, RAMNAUTH ([id number]) Visit Report for 01/15/2022 Problem List Details Patient Name: Date of Service: Theresa LLA [name] 01/15/2022 1:00 PM Medical Record Number: [id number] Patient Account Number: [id number] Date of Birth/Sex: Treating RN: 04-Nov-1960 (61 y.o. 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 01/15/2022 Medical Record Number: [id number] Patient Account Number: [id number] Date of Birth/Sex: Treating RN: 08-Jul-1960 (61 y.o. 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

## 2022-01-15 NOTE — Progress Notes (Addendum)
[name], [name] ([id number]) Visit Report for 01/15/2022 Arrival Information Details Patient Name: Date of Service: [name] 01/15/2022 1:00 PM Medical Record Number: [id number] Patient Account Number: [id number] Date of Birth/Sex: Treating RN: 05/04/1961 (61 y.o. [name], [name] Primary Care Wilmetta Speiser: [name] Other Clinician: [name] Referring Dalinda Heidt: Treating Amyia Lodwick/Extender: [name] in Treatment: 10 Visit Information History Since Last Visit All ordered tests and consults were completed: Yes Patient Arrived: Ambulatory Added or deleted any medications: No Arrival Time: 12:27 Any new allergies or adverse reactions: No Accompanied By: None Had a fall or experienced change in No Transfer Assistance: None activities of daily living that may affect Patient Identification Verified: Yes risk of falls: Secondary Verification Process Completed: Yes Signs or symptoms of abuse/neglect since last visito No Patient Requires Transmission-Based Precautions: No Hospitalized since last visit: No Patient Has Alerts: No Implantable device outside of the clinic excluding No cellular tissue based products placed in the center since last visit: Pain Present Now: No Electronic Signature(s) Signed: 01/15/2022 1:15:11 PM By: [name] EMT Entered By: [name] on 01/15/2022 13:15:11 -------------------------------------------------------------------------------- Encounter Discharge Information Details Patient Name: Date of Service: [name] 01/15/2022 1:00 PM Medical Record Number: [id number] Patient Account Number: [id number] Date of Birth/Sex: Treating RN: 1960/07/19 (61 y.o. [name] Primary Care Samah Lapiana: [name] Other Clinician: [name] Referring Pearson Reasons: Treating Legacie Dillingham/Extender: [name] in Treatment: 10 Encounter Discharge  Information Items Discharge Condition: Stable Ambulatory Status: Ambulatory Discharge Destination: Home Transportation: Private Auto Accompanied By: None Schedule Follow-up Appointment: Yes Clinical Summary of Care: Electronic Signature(s) Signed: 01/15/2022 3:04:26 PM By: [name] EMT Entered By: [name] on 01/15/2022 15:04:26 -------------------------------------------------------------------------------- Vitals Details Patient Name: Date of Service: [name] 01/15/2022 1:00 PM Medical Record Number: [id number] Patient Account Number: [id number] Date of Birth/Sex: Treating RN: 1961/06/03 (61 y.o. [name] Primary Care Zeki Bedrosian: [name] Other Clinician: [name] Referring Rina Adney: Treating Frenchie Pribyl/Extender: [name] Weeks in Treatment: 10 Vital Signs Time Taken: 12:36 Temperature (F): 98.6 Height (in): 60 Pulse (bpm): 86 Weight (lbs): 88 Respiratory Rate (breaths/min): 16 Body Mass Index (BMI): 17.2 Blood Pressure (mmHg): 120/73 Reference Range: 80 - 120 mg / dl Electronic Signature(s) Signed: 01/15/2022 1:15:41 PM By: [name] EMT Entered By: [name] on 01/15/2022 13:15:40

## 2022-01-16 ENCOUNTER — Encounter (HOSPITAL_BASED_OUTPATIENT_CLINIC_OR_DEPARTMENT_OTHER): Payer: Medicare Other | Admitting: General Surgery

## 2022-01-16 DIAGNOSIS — M272 Inflammatory conditions of jaws: Secondary | ICD-10-CM | POA: Diagnosis not present

## 2022-01-16 NOTE — Progress Notes (Addendum)
[name], [name] ([id number]) Visit Report for 01/16/2022 Arrival Information Details Patient Name: Date of Service: [name] 01/16/2022 1:00 PM Medical Record Number: [id number] Patient Account Number: [id number] Date of Birth/Sex: Treating RN: Sep 12, 1960 (61 y.o. [name] Primary Care Jeana Kersting: [name] Other Clinician: Donavan Burnet Referring Renel Ende: Treating Miata Culbreth/Extender: Mathews Argyle in Treatment: 10 Visit Information History Since Last Visit All ordered tests and consults were completed: Yes Patient Arrived: Ambulatory Added or deleted any medications: No Arrival Time: 12:09 Any new allergies or adverse reactions: No Accompanied By: self Had a fall or experienced change in No Transfer Assistance: None activities of daily living that may affect Patient Identification Verified: Yes risk of falls: Secondary Verification Process Completed: Yes Signs or symptoms of abuse/neglect since last visito No Patient Requires Transmission-Based Precautions: No Hospitalized since last visit: No Patient Has Alerts: No Implantable device outside of the clinic excluding No cellular tissue based products placed in the center since last visit: Pain Present Now: No Electronic Signature(s) Signed: 01/16/2022 2:46:04 PM By: Donavan Burnet CHT EMT BS , , Entered By: Donavan Burnet on 01/16/2022 14:46:04 -------------------------------------------------------------------------------- Encounter Discharge Information Details Patient Name: Date of Service: [name] 01/16/2022 1:00 PM Medical Record Number: [id number] Patient Account Number: [id number] Date of Birth/Sex: Treating RN: 03/15/1961 (61 y.o. [name] Primary Care Brystol Wasilewski: [name] Other Clinician: Donavan Burnet Referring Ponce Skillman: Treating Zyah Gomm/Extender: Mathews Argyle in Treatment:  10 Encounter Discharge Information Items Discharge Condition: Stable Ambulatory Status: Ambulatory Discharge Destination: Home Transportation: Private Auto Accompanied By: self Schedule Follow-up Appointment: No Clinical Summary of Care: Electronic Signature(s) Signed: 01/16/2022 2:50:09 PM By: Donavan Burnet CHT EMT BS , , Entered By: Donavan Burnet on 01/16/2022 14:50:09 -------------------------------------------------------------------------------- Vitals Details Patient Name: Date of Service: [name] 01/16/2022 1:00 PM Medical Record Number: [id number] Patient Account Number: [id number] Date of Birth/Sex: Treating RN: 1961/01/07 (61 y.o. [name] Primary Care Drewey Begue: [name] Other Clinician: [name] Referring Azelie Noguera: Treating Fiona Coto/Extender: [name] Weeks in Treatment: 10 Vital Signs Time Taken: 12:24 Temperature (F): 98.4 Height (in): 60 Pulse (bpm): 86 Weight (lbs): 88 Respiratory Rate (breaths/min): 16 Body Mass Index (BMI): 17.2 Blood Pressure (mmHg): 94/73 Reference Range: 80 - 120 mg / dl Electronic Signature(s) Signed: 01/16/2022 2:47:25 PM By: Donavan Burnet CHT EMT BS , , Entered By: Donavan Burnet on 01/16/2022 14:47:25

## 2022-01-16 NOTE — Progress Notes (Addendum)
[name], [name] ([id number]) Visit Report for 01/16/2022 HBO Details Patient Name: Date of Service: [name] 01/16/2022 1:00 PM Medical Record Number: [id number] Patient Account Number: [id number] Date of Birth/Sex: Treating RN: 1961/02/06 (61 y.o. 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, Jaw HBO Treatment Details Treatment Number: 43 Patient Type: Outpatient Chamber Type: Monoplace Chamber Serial #: [id number] Treatment Protocol: 2.5 ATA with 90 minutes oxygen, with two 5 minute air breaks Treatment Details Compression Rate Down: 2.0 psi / minute De-Compression Rate Up: 2.0 psi / minute A breaks and breathing ir Compress Tx Pressure periods Decompress Decompress Begins Reached (leave unused spaces Begins Ends blank) Chamber Pressure (ATA 1 2.5 2.5 2.5 2.5 2.5 - - 2.5 1 ) Clock Time (24 hr) 12:28 12:42 13:12 13:17 13:48 13:52 - - 14:22 14:33 Treatment Length: 125 (minutes) Treatment Segments: 4 Vital Signs Capillary Blood Glucose Reference Range: 80 - 120 mg / dl HBO Diabetic Blood Glucose Intervention Range: <131 mg/dl or >249 mg/dl Type: Time Vitals Blood Respiratory Capillary Blood Glucose Pulse Action Pulse: Temperature: Taken: Pressure: Rate: Glucose (mg/dl): Meter #: Oximetry (%) Taken: Pre 12:24 94/73 86 16 98.4 none per protocol Post 14:37 148/98 73 16 97.9 none per protocol Treatment Response Treatment Toleration: Well Treatment Completion Status: Treatment Completed without Adverse Event Physician HBO Attestation: I certify that I supervised this HBO treatment in accordance with  Medicare guidelines. A trained emergency response team is readily available per Yes hospital policies and procedures. Continue HBOT as ordered. Yes Electronic Signature(s) Signed: 01/16/2022 3:22:23 PM By: [name] MD FACS Previous Signature: 01/16/2022 2:49:28 PM Version By: Donavan Burnet CHT EMT BS , , Entered By: [name] on 01/16/2022 15:22:23 -------------------------------------------------------------------------------- HBO Safety Checklist Details Patient Name: Date of Service: [name] 01/16/2022 1:00 PM Medical Record Number: [id number] Patient Account Number: [id number] Date of Birth/Sex: Treating RN: 24-Jun-1961 (61 y.o. [name], [name] Primary Care [name]: [name] Other Clinician: [name] Referring Fabian Walder: Treating Meiah Zamudio/Extender: [name] Weeks in Treatment: 10 HBO Safety Checklist Items Safety Checklist Consent Form Signed Patient voided / foley secured and emptied When did you last eato 1030 Last dose of injectable or oral agent n/a Ostomy pouch emptied and vented if applicable NA All implantable devices assessed, documented and approved NA Intravenous access site secured and place NA Valuables secured Linens and cotton and cotton/polyester blend (less than 51% polyester) Personal oil-based products / skin lotions / body lotions removed Wigs or hairpieces removed NA Smoking or tobacco materials removed NA Books / newspapers / magazines / loose paper removed Cologne, aftershave, perfume and deodorant removed Jewelry removed (may wrap wedding band) Make-up removed Hair care products removed Battery operated devices (external) removed Heating patches and chemical warmers removed Titanium eyewear removed NA Nail polish cured greater than 10 hours Casting material cured greater than 10 hours NA Hearing aids removed NA Loose dentures or partials removed NA Prosthetics  have been removed NA Patient demonstrates correct use of air break device (if applicable) Patient concerns have been addressed Patient grounding bracelet on and cord attached to chamber Specifics for Inpatients (complete in addition to above) Medication sheet sent with patient NA Intravenous medications needed  or due during therapy sent with patient NA Drainage tubes (e.g. nasogastric tube or chest tube secured and vented) NA Endotracheal or Tracheotomy tube secured NA Cuff deflated of air and inflated with saline NA Airway suctioned NA Notes Paper version used prior to treatment. Electronic Signature(s) Signed: 01/16/2022 2:48:26 PM By: Donavan Burnet CHT EMT BS , , Entered By: Donavan Burnet on 01/16/2022 14:48:25

## 2022-01-16 NOTE — Progress Notes (Signed)
LYNNEA, VANDERVOORT ([id number]) Visit Report for 01/16/2022 SuperBill Details Patient Name: Date of Service: [name] 01/16/2022 Medical Record Number: [id number] Patient Account Number: [id number] Date of Birth/Sex: Treating RN: August 05, 1960 (61 y.o. 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

## 2022-01-17 ENCOUNTER — Encounter (HOSPITAL_BASED_OUTPATIENT_CLINIC_OR_DEPARTMENT_OTHER): Payer: Medicare Other | Admitting: General Surgery

## 2022-01-17 DIAGNOSIS — M272 Inflammatory conditions of jaws: Secondary | ICD-10-CM | POA: Diagnosis not present

## 2022-01-17 NOTE — Progress Notes (Addendum)
SIERA, BEYERSDORF ([id number]) Visit Report for 01/17/2022 HBO Details Patient Name: Date of Service: [name] 01/17/2022 1:00 PM Medical Record Number: [id number] Patient Account Number: [id number] Date of Birth/Sex: Treating RN: April 11, 1961 (61 y.o. 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, Jaw HBO Treatment Details Treatment Number: 44 Patient Type: Outpatient Chamber Type: Monoplace Chamber Serial #: [id number] Treatment Protocol: 2.5 ATA with 90 minutes oxygen, with two 5 minute air breaks Treatment Details Compression Rate Down: 2.0 psi / minute De-Compression Rate Up: A breaks and breathing ir Compress Tx Pressure periods Decompress Decompress Begins Reached (leave unused spaces Begins Ends blank) Chamber Pressure (ATA 1 2.5 2.5 2.5 2.5 2.5 - - 2.5 1 ) Clock Time (24 hr) 12:37 12:48 13:18 13:24 13:54 13:59 - - 14:29 14:37 Treatment Length: 120 (minutes) Treatment Segments: 4 Vital Signs Capillary Blood Glucose Reference Range: 80 - 120 mg / dl HBO Diabetic Blood Glucose Intervention Range: <131 mg/dl or >249 mg/dl Time Vitals Blood Respiratory Capillary Blood Glucose Pulse Action Type: Pulse: Temperature: Taken: Pressure: Rate: Glucose (mg/dl): Meter #: Oximetry (%) Taken: Pre 12:26 110/89 91 16 98.1 Post 14:44 107/73 66 14 97.5 Treatment Response Treatment Toleration: Well Treatment Completion Status: Treatment Completed without Adverse Event Physician HBO Attestation: I certify that I supervised this HBO treatment in accordance with Medicare guidelines. A trained emergency response team  is readily available per Yes hospital policies and procedures. Continue HBOT as ordered. Yes Electronic Signature(s) Signed: 01/17/2022 4:24:45 PM By: [name] MD FACS Previous Signature: 01/17/2022 3:04:55 PM Version By: [name] EMT Previous Signature: 01/17/2022 12:41:17 PM Version By: Donavan Burnet CHT EMT BS , , Entered By: [name] on 01/17/2022 16:24:45 -------------------------------------------------------------------------------- HBO Safety Checklist Details Patient Name: Date of Service: [name] 01/17/2022 1:00 PM Medical Record Number: [id number] Patient Account Number: [id number] Date of Birth/Sex: Treating RN: Apr 09, 1961 (61 y.o. [name] Primary Care Suriya Kovarik: [name] Other Clinician: Donavan Burnet Referring Derion Kreiter: Treating Kenson Groh/Extender: [name] Weeks in Treatment: 10 HBO Safety Checklist Items Safety Checklist Consent Form Signed Patient voided / foley secured and emptied When did you last eato 1130 Last dose of injectable or oral agent n/a Ostomy pouch emptied and vented if applicable NA All implantable devices assessed, documented and approved port Intravenous access site secured and place NA Valuables secured Linens and cotton and cotton/polyester blend (less than 51% polyester) Personal oil-based products / skin lotions / body lotions removed Wigs or hairpieces removed NA Smoking or tobacco materials removed NA Books / newspapers / magazines / loose paper removed Cologne, aftershave, perfume and deodorant removed Jewelry removed (may wrap wedding band) Make-up removed Hair care products removed Battery operated devices (external) removed Heating patches and chemical warmers removed Titanium eyewear removed NA Nail polish cured greater than 10 hours Casting material cured greater than 10 hours NA Hearing aids removed NA Loose dentures or partials  removed NA Prosthetics have been removed NA Patient demonstrates correct use of air break device (if applicable) Patient concerns have been addressed Patient grounding bracelet on and cord attached to chamber Specifics for Inpatients (complete in addition to above) Medication sheet sent with patient NA Intravenous medications needed  or due during therapy sent with patient NA Drainage tubes (e.g. nasogastric tube or chest tube secured and vented) NA Endotracheal or Tracheotomy tube secured NA Cuff deflated of air and inflated with saline NA Airway suctioned NA Notes Paper version used prior to treatment. Electronic Signature(s) Signed: 01/17/2022 12:40:44 PM By: Donavan Burnet CHT EMT BS , , Entered By: Donavan Burnet on 01/17/2022 12:40:44

## 2022-01-17 NOTE — Progress Notes (Signed)
[name], [name] ([id number]) Visit Report for 01/17/2022 Problem List Details Patient Name: Date of Service: [name] 01/17/2022 1:00 PM Medical Record Number: [id number] Patient Account Number: [id number] Date of Birth/Sex: Treating RN: Sep 03, 1960 (61 y.o. 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 01/17/2022 Medical Record Number: [id number] Patient Account Number: [id number] Date of Birth/Sex: Treating RN: July 24, 1960 (61 y.o. 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

## 2022-01-17 NOTE — Progress Notes (Addendum)
[name], [name] ([id number]) Visit Report for 01/17/2022 Arrival Information Details Patient Name: Date of Service: Theresa LLA [name] 01/17/2022 1:00 PM Medical Record Number: [id number] Patient Account Number: [id number] Date of Birth/Sex: Treating RN: [birth year] (61 y.o. [name] Primary Care Emilyanne Mcgough: [name] Other Clinician: Donavan Burnet Referring Shayda Kalka: Treating Danikah Budzik/Extender: Mathews Argyle in Treatment: 10 Visit Information History Since Last Visit All ordered tests and consults were completed: Yes Patient Arrived: Ambulatory Added or deleted any medications: No Arrival Time: 12:15 Any new allergies or adverse reactions: No Accompanied By: self Had a fall or experienced change in No Transfer Assistance: None activities of daily living that may affect Patient Identification Verified: Yes risk of falls: Secondary Verification Process Completed: Yes Signs or symptoms of abuse/neglect since last visito No Patient Requires Transmission-Based Precautions: No Hospitalized since last visit: No Patient Has Alerts: No Implantable device outside of the clinic excluding No cellular tissue based products placed in the center since last visit: Pain Present Now: No Electronic Signature(s) Signed: 01/17/2022 12:37:47 PM By: Donavan Burnet CHT EMT BS , , Entered By: Donavan Burnet on 01/17/2022 12:37:47 -------------------------------------------------------------------------------- Encounter Discharge Information Details Patient Name: Date of Service: Theresa LLA HA Theresa Bienenstock D. 01/17/2022 1:00 PM Medical Record Number: [id number] Patient Account Number: [id number] Date of Birth/Sex: Treating RN: 15-Oct-1960 (61 y.o. [name] Primary Care Shayla Heming: [name] Other Clinician: Donavan Burnet Referring Lauryl Seyer: Treating Zissel Biederman/Extender: Mathews Argyle in Treatment:  10 Encounter Discharge Information Items Discharge Condition: Stable Ambulatory Status: Ambulatory Discharge Destination: Home Transportation: Private Auto Accompanied By: None Schedule Follow-up Appointment: Yes Clinical Summary of Care: Electronic Signature(s) Signed: 01/17/2022 3:06:00 PM By: [name] EMT Entered By: [name] on 01/17/2022 15:06:00 -------------------------------------------------------------------------------- Vitals Details Patient Name: Date of Service: Theresa LLA [name] 01/17/2022 1:00 PM Medical Record Number: [id number] Patient Account Number: [id number] Date of Birth/Sex: Treating RN: 11/01/1960 (61 y.o. [name] Primary Care Aman Batley: [name] Other Clinician: Donavan Burnet Referring Jaydence Vanyo: Treating Lamere Lightner/Extender: [name] Weeks in Treatment: 10 Vital Signs Time Taken: 12:26 Temperature (F): 98.1 Height (in): 60 Pulse (bpm): 91 Weight (lbs): 88 Respiratory Rate (breaths/min): 16 Body Mass Index (BMI): 17.2 Blood Pressure (mmHg): 110/89 Reference Range: 80 - 120 mg / dl Electronic Signature(s) Signed: 01/17/2022 12:38:22 PM By: Donavan Burnet CHT EMT BS , , Entered By: Donavan Burnet on 01/17/2022 12:38:22

## 2022-01-18 ENCOUNTER — Encounter (HOSPITAL_BASED_OUTPATIENT_CLINIC_OR_DEPARTMENT_OTHER): Payer: Medicare Other | Admitting: General Surgery

## 2022-01-18 DIAGNOSIS — M272 Inflammatory conditions of jaws: Secondary | ICD-10-CM | POA: Diagnosis not present

## 2022-01-18 NOTE — Progress Notes (Addendum)
[name], [name] ([id number]) Visit Report for 01/18/2022 HBO Details Patient Name: Date of Service: [name] 01/18/2022 1:00 PM Medical Record Number: [id number] Patient Account Number: [id number] Date of Birth/Sex: Treating RN: 06/02/1961 (61 y.o. 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, Jaw HBO Treatment Details Treatment Number: 45 Patient Type: Outpatient Chamber Type: Monoplace Chamber Serial #: [id number] Treatment Protocol: 2.5 ATA with 90 minutes oxygen, with two 5 minute air breaks Treatment Details Compression Rate Down: 2.0 psi / minute De-Compression Rate Up: 2.0 psi / minute A breaks and breathing ir Compress Tx Pressure periods Decompress Decompress Begins Reached (leave unused spaces Begins Ends blank) Chamber Pressure (ATA 1 2.5 2.5 2.5 2.5 2.5 - - 2.5 1 ) Clock Time (24 hr) 12:29 12:41 13:11 13:16 13:46 13:51 - - 14:21 14:35 Treatment Length: 126 (minutes) Treatment Segments: 4 Vital Signs Capillary Blood Glucose Reference Range: 80 - 120 mg / dl HBO Diabetic Blood Glucose Intervention Range: <131 mg/dl or >249 mg/dl Type: Time Vitals Blood Respiratory Capillary Blood Glucose Pulse Action Pulse: Temperature: Taken: Pressure: Rate: Glucose (mg/dl): Meter #: Oximetry (%) Taken: Pre 12:21 110/88 101 18 98.2 none per protocol Post 14:38 126/76 66 18 98.2 none per protocol Treatment Response Treatment Toleration: Well Treatment Completion Status: Treatment Completed without Adverse Event Physician HBO Attestation: I certify that I supervised this HBO treatment in accordance  with Medicare guidelines. A trained emergency response team is readily available per Yes hospital policies and procedures. Continue HBOT as ordered. Yes Electronic Signature(s) Signed: 01/18/2022 4:03:05 PM By: [name] MD FACS Previous Signature: 01/18/2022 3:17:53 PM Version By: Donavan Burnet CHT EMT BS , , Entered By: [name] on 01/18/2022 16:03:04 -------------------------------------------------------------------------------- HBO Safety Checklist Details Patient Name: Date of Service: [name] Gearldine Dillon D. 01/18/2022 1:00 PM Medical Record Number: [id number] Patient Account Number: [id number] Date of Birth/Sex: Treating RN: 03/10/1961 (61 y.o. [name] Primary Care Shagun Wordell: [name] Other Clinician: Donavan Burnet Referring Lashaun Krapf: Treating Claretha Townshend/Extender: [name] Weeks in Treatment: 10 HBO Safety Checklist Items Safety Checklist Consent Form Signed Patient voided / foley secured and emptied When did you last eato 1130 Last dose of injectable or oral agent n/a Ostomy pouch emptied and vented if applicable NA All implantable devices assessed, documented and approved port Intravenous access site secured and place NA Valuables secured Linens and cotton and cotton/polyester blend (less than 51% polyester) Personal oil-based products / skin lotions / body lotions removed Wigs or hairpieces removed NA Smoking or tobacco materials removed NA Books / newspapers / magazines / loose paper removed Cologne, aftershave, perfume and deodorant removed Jewelry removed (may wrap wedding band) Make-up removed Hair care products removed Battery operated devices (external) removed Heating patches and chemical warmers removed NA Titanium eyewear removed NA Nail polish cured greater than 10 hours 2 weeks ago Casting material cured greater than 10 hours NA Hearing aids removed NA Loose dentures or partials  removed NA Prosthetics have been removed NA Patient demonstrates correct use of air break device (if applicable) Patient concerns have been addressed Patient grounding bracelet on and cord attached to chamber Specifics for Inpatients (complete in addition to above) Medication sheet sent with patient  NA Intravenous medications needed or due during therapy sent with patient NA Drainage tubes (e.g. nasogastric tube or chest tube secured and vented) NA Endotracheal or Tracheotomy tube secured NA Cuff deflated of air and inflated with saline NA Airway suctioned NA Notes Paper version used prior to treatment. Electronic Signature(s) Signed: 01/18/2022 3:15:26 PM By: Donavan Burnet CHT EMT BS , , Previous Signature: 01/18/2022 2:55:24 PM Version By: Donavan Burnet CHT EMT BS , , Entered By: Donavan Burnet on 01/18/2022 15:15:26

## 2022-01-18 NOTE — Progress Notes (Signed)
SABIHA, SURA ([id number]) Visit Report for 01/18/2022 SuperBill Details Patient Name: Date of Service: [name] 01/18/2022 Medical Record Number: [id number] Patient Account Number: [id number] Date of Birth/Sex: Treating RN: 09-04-1960 (61 y.o. 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

## 2022-01-18 NOTE — Progress Notes (Addendum)
[name], [name] ([id number]) Visit Report for 01/18/2022 Arrival Information Details Patient Name: Date of Service: Theresa LLA Theresa Alexander D. 01/18/2022 1:00 PM Medical Record Number: [id number] Patient Account Number: [id number] Date of Birth/Sex: Treating RN: May 21, 1961 (61 y.o. [name] Primary Care Naylani Bradner: [name] Other Clinician: Donavan Burnet Referring Toney Difatta: Treating Tai Skelly/Extender: Mathews Argyle in Treatment: 10 Visit Information History Since Last Visit All ordered tests and consults were completed: Yes Patient Arrived: Ambulatory Added or deleted any medications: No Arrival Time: 12:15 Any new allergies or adverse reactions: No Accompanied By: self Had a fall or experienced change in No Transfer Assistance: None activities of daily living that may affect Patient Identification Verified: Yes risk of falls: Secondary Verification Process Completed: Yes Signs or symptoms of abuse/neglect since last visito No Patient Requires Transmission-Based Precautions: No Hospitalized since last visit: No Patient Has Alerts: No Implantable device outside of the clinic excluding No cellular tissue based products placed in the center since last visit: Pain Present Now: No Electronic Signature(s) Signed: 01/18/2022 2:53:52 PM By: Donavan Burnet CHT EMT BS , , Entered By: Donavan Burnet on 01/18/2022 14:53:51 -------------------------------------------------------------------------------- Encounter Discharge Information Details Patient Name: Date of Service: Theresa [name] 01/18/2022 1:00 PM Medical Record Number: [id number] Patient Account Number: [id number] Date of Birth/Sex: Treating RN: [birth year] (61 y.o. [name] Primary Care Jaleen Finch: [name] Other Clinician: Donavan Burnet Referring Saarah Dewing: Treating Presten Joost/Extender: Mathews Argyle in Treatment:  10 Encounter Discharge Information Items Discharge Condition: Stable Ambulatory Status: Ambulatory Discharge Destination: Home Transportation: Private Auto Accompanied By: self Schedule Follow-up Appointment: No Clinical Summary of Care: Electronic Signature(s) Signed: 01/18/2022 3:20:21 PM By: Donavan Burnet CHT EMT BS , , Entered By: Donavan Burnet on 01/18/2022 15:20:21 -------------------------------------------------------------------------------- [place] Details Patient Name: Date of Service: Theresa LLA Theresa Alexander D. 01/18/2022 1:00 PM Medical Record Number: [id number] Patient Account Number: [id number] Date of Birth/Sex: Treating RN: [birth year] (61 y.o. [name] Primary Care Tenille Morrill: [name] Other Clinician: Donavan Burnet Referring Bonnie Roig: Treating Rashied Corallo/Extender: Mathews Argyle in Treatment: 10 Vital Signs Time Taken: 12:21 Temperature (F): 98.2 Height (in): 60 Pulse (bpm): 101 Weight (lbs): 88 Respiratory Rate (breaths/min): 18 Body Mass Index (BMI): 17.2 Blood Pressure (mmHg): 110/88 Reference Range: 80 - 120 mg / dl Electronic Signature(s) Signed: 01/18/2022 3:15:17 PM By: Donavan Burnet CHT EMT BS , , Previous Signature: 01/18/2022 2:54:12 PM Version By: Donavan Burnet CHT EMT BS , , Entered By: Donavan Burnet on 01/18/2022 15:15:17

## 2022-01-21 ENCOUNTER — Encounter (HOSPITAL_BASED_OUTPATIENT_CLINIC_OR_DEPARTMENT_OTHER): Payer: Medicare Other | Admitting: Internal Medicine

## 2022-01-21 DIAGNOSIS — C411 Malignant neoplasm of mandible: Secondary | ICD-10-CM

## 2022-01-21 DIAGNOSIS — M272 Inflammatory conditions of jaws: Secondary | ICD-10-CM

## 2022-01-21 DIAGNOSIS — Z923 Personal history of irradiation: Secondary | ICD-10-CM

## 2022-01-21 DIAGNOSIS — M8788 Other osteonecrosis, other site: Secondary | ICD-10-CM

## 2022-01-21 NOTE — Progress Notes (Addendum)
[name], [name] ([id number]) Visit Report for 01/21/2022 HBO Details Patient Name: Date of Service: [name] 01/21/2022 1:00 PM Medical Record Number: [id number] Patient Account Number: [id number] Date of Birth/Sex: Treating RN: [birth year] (61 y.o. 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, Jaw HBO Treatment Details Treatment Number: 46 Patient Type: Outpatient Chamber Type: Monoplace Chamber Serial #: [id number] Treatment Protocol: 2.5 ATA with 90 minutes oxygen, with two 5 minute air breaks Treatment Details Compression Rate Down: 2.0 psi / minute De-Compression Rate Up: A breaks and breathing ir Compress Tx Pressure periods Decompress Decompress Begins Reached (leave unused spaces Begins Ends blank) Chamber Pressure (ATA 1 2.5 2.5 2.5 2.5 2.5 - - 2.5 1 ) Clock Time (24 hr) 12:41 12:56 13:26 13:31 14:01 14:04 - - 14:34 14:49 Treatment Length: 128 (minutes) Treatment Segments: 4 Vital Signs Capillary Blood Glucose Reference Range: 80 - 120 mg / dl HBO Diabetic Blood Glucose Intervention Range: <131 mg/dl or >249 mg/dl Time Vitals Blood Respiratory Capillary Blood Glucose Pulse Action Type: Pulse: Temperature: Taken: Pressure: Rate: Glucose (mg/dl): Meter #: Oximetry (%) Taken: Pre 12:26 117/78 98 16 98 Post 14:51 122/79 64 16 98 Treatment Response Treatment Toleration: Well Treatment Completion Status: Treatment Completed without Adverse Event Physician HBO Attestation: I certify that I supervised this HBO treatment in accordance with Medicare guidelines. A trained emergency response team is  readily available per Yes hospital policies and procedures. Continue HBOT as ordered. Yes Electronic Signature(s) Signed: 01/21/2022 4:09:14 PM By: [name] DO Previous Signature: 01/21/2022 3:07:45 PM Version By: [name] EMT Previous Signature: 01/21/2022 1:55:04 PM Version By: [name] EMT Entered By: [name] on 01/21/2022 16:08:29 -------------------------------------------------------------------------------- HBO Safety Checklist Details Patient Name: Date of Service: [name] 01/21/2022 1:00 PM Medical Record Number: [id number] Patient Account Number: [id number] Date of Birth/Sex: Treating RN: [birth year] (61 y.o. [name], [name] Primary Care Swannie Milius: [name] Other Clinician: [name] Referring Mihaela Fajardo: Treating Trypp Heckmann/Extender: [name] Weeks in Treatment: 11 HBO Safety Checklist Items Safety Checklist Consent Form Signed Patient voided / foley secured and emptied When did you last eato 1700 01/21/2022 Last dose of injectable or oral agent NA Ostomy pouch emptied and vented if applicable NA All implantable devices assessed, documented and approved NA Intravenous access site secured and place NA Valuables secured Linens and cotton and cotton/polyester blend (less than 51% polyester) Personal oil-based products / skin lotions / body lotions removed Wigs or hairpieces removed NA Smoking or tobacco materials removed Books / newspapers / magazines / loose paper removed Cologne, aftershave, perfume and deodorant removed Jewelry removed (may wrap wedding band) NA Make-up removed Hair care products removed Battery operated devices (external) removed Heating patches and chemical warmers removed Titanium eyewear removed NA Nail polish cured greater than 10 hours 2 weeks ago Casting material cured greater than 10 hours NA Hearing aids removed NA Loose dentures or partials  removed NA Prosthetics have been removed NA Patient demonstrates correct use of air break device (if applicable) Patient concerns have been addressed Patient grounding bracelet on and cord attached to chamber Specifics for Inpatients (complete in addition to above) Medication sheet sent with patient NA Intravenous medications needed or  due during therapy sent with patient NA Drainage tubes (e.g. nasogastric tube or chest tube secured and vented) NA Endotracheal or Tracheotomy tube secured NA Cuff deflated of air and inflated with saline NA Airway suctioned Notes The safety checklist was done before treatment was started. Electronic Signature(s) Signed: 01/21/2022 1:54:20 PM By: [name] EMT Entered By: [name] on 01/21/2022 13:54:20

## 2022-01-21 NOTE — Progress Notes (Addendum)
TALISSA, APPLE ([id number]) Visit Report for 01/21/2022 Arrival Information Details Patient Name: Date of Service: [name] 01/21/2022 1:00 PM Medical Record Number: [id number] Patient Account Number: [id number] Date of Birth/Sex: Treating RN: 1960-08-29 (61 y.o. [name], [name] Primary Care Tahja Liao: [name] Other Clinician: [name] Referring Tyeesha Riker: Treating Shabre Kreher/Extender: [name] in Treatment: 11 Visit Information History Since Last Visit All ordered tests and consults were completed: Yes Patient Arrived: Ambulatory Added or deleted any medications: No Arrival Time: 12:21 Any new allergies or adverse reactions: No Accompanied By: None Had a fall or experienced change in No Transfer Assistance: None activities of daily living that may affect Patient Identification Verified: Yes risk of falls: Secondary Verification Process Completed: Yes Signs or symptoms of abuse/neglect since last visito No Patient Requires Transmission-Based Precautions: No Hospitalized since last visit: No Patient Has Alerts: No Implantable device outside of the clinic excluding No cellular tissue based products placed in the center since last visit: Pain Present Now: Yes Notes Pain 6 out of 10 Electronic Signature(s) Signed: 01/21/2022 1:51:18 PM By: [name] EMT Entered By: [name] on 01/21/2022 13:51:18 -------------------------------------------------------------------------------- Encounter Discharge Information Details Patient Name: Date of Service: [name] 01/21/2022 1:00 PM Medical Record Number: [id number] Patient Account Number: [id number] Date of Birth/Sex: Treating RN: 1960/11/30 (61 y.o. [name] Primary Care Armonee Bojanowski: [name] Other Clinician: [name] Referring Aldeen Riga: Treating Yamira Papa/Extender: [name] in Treatment:  11 Encounter Discharge Information Items Discharge Condition: Stable Ambulatory Status: Ambulatory Discharge Destination: Home Transportation: Private Auto Accompanied By: None Schedule Follow-up Appointment: Yes Clinical Summary of Care: Electronic Signature(s) Signed: 01/21/2022 3:08:53 PM By: [name] EMT Entered By: [name] on 01/21/2022 15:08:53 -------------------------------------------------------------------------------- Vitals Details Patient Name: Date of Service: [name] 01/21/2022 1:00 PM Medical Record Number: [id number] Patient Account Number: [id number] Date of Birth/Sex: Treating RN: 1961-05-26 (61 y.o. [name], [name] Primary Care Augustin Bun: [name] Other Clinician: [name] Referring Jahden Schara: Treating Shivani Barrantes/Extender: [name] Weeks in Treatment: 11 Vital Signs Time Taken: 12:26 Temperature (F): 98.0 Height (in): 60 Pulse (bpm): 98 Weight (lbs): 88 Respiratory Rate (breaths/min): 16 Body Mass Index (BMI): 17.2 Blood Pressure (mmHg): 117/78 Reference Range: 80 - 120 mg / dl Electronic Signature(s) Signed: 01/21/2022 1:51:53 PM By: [name] EMT Entered By: [name] on 01/21/2022 13:51:53

## 2022-01-21 NOTE — Progress Notes (Signed)
[name], [name] ([id number]) Visit Report for 01/21/2022 Problem List Details Patient Name: Date of Service: [name] 01/21/2022 1:00 PM Medical Record Number: [id number] Patient Account Number: [id number] Date of Birth/Sex: Treating RN: 1960-10-11 (61 y.o. 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 01/21/2022 Medical Record Number: [id number] Patient Account Number: [id number] Date of Birth/Sex: Treating RN: [birth year] (61 y.o. 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

## 2022-01-22 ENCOUNTER — Encounter (HOSPITAL_BASED_OUTPATIENT_CLINIC_OR_DEPARTMENT_OTHER): Payer: Medicare Other | Admitting: Internal Medicine

## 2022-01-22 DIAGNOSIS — M8788 Other osteonecrosis, other site: Secondary | ICD-10-CM

## 2022-01-22 DIAGNOSIS — Z923 Personal history of irradiation: Secondary | ICD-10-CM

## 2022-01-22 DIAGNOSIS — C411 Malignant neoplasm of mandible: Secondary | ICD-10-CM | POA: Diagnosis not present

## 2022-01-22 DIAGNOSIS — M272 Inflammatory conditions of jaws: Secondary | ICD-10-CM | POA: Diagnosis not present

## 2022-01-22 NOTE — Progress Notes (Signed)
ALIVIAH, SPAIN ([id number]) Visit Report for 01/22/2022 HBO Safety Checklist Details Patient Name: Date of Service: [name] 01/22/2022 1:00 PM Medical Record Number: [id number] Patient Account Number: [id number] Date of Birth/Sex: Treating RN: 1961/01/07 (61 y.o. [name] Primary Care Dezmen Alcock: [name] Other Clinician: Donavan Burnet Referring Marley Pakula: Treating Khady Vandenberg/Extender: [name] Weeks in Treatment: 11 HBO Safety Checklist Items Safety Checklist Consent Form Signed Patient voided / foley secured and emptied When did you last eato 1100 Last dose of injectable or oral agent n/a Ostomy pouch emptied and vented if applicable NA All implantable devices assessed, documented and approved port Intravenous access site secured and place NA Valuables secured Linens and cotton and cotton/polyester blend (less than 51% polyester) Personal oil-based products / skin lotions / body lotions removed Wigs or hairpieces removed NA Smoking or tobacco materials removed NA Books / newspapers / magazines / loose paper removed Cologne, aftershave, perfume and deodorant removed Jewelry removed (may wrap wedding band) Make-up removed Hair care products removed Battery operated devices (external) removed Heating patches and chemical warmers removed Titanium eyewear removed NA Nail polish cured greater than 10 hours NA Casting material cured greater than 10 hours NA Hearing aids removed NA Loose dentures or partials removed NA Prosthetics have been removed NA Patient demonstrates correct use of air break device (if applicable) Patient concerns have been addressed Patient grounding bracelet on and cord attached to chamber Specifics for Inpatients (complete in addition to above) Medication sheet sent with patient NA Intravenous medications needed or due during therapy sent with patient NA Drainage tubes (e.g.  nasogastric tube or chest tube secured and vented) NA Endotracheal or Tracheotomy tube secured NA Cuff deflated of air and inflated with saline NA Airway suctioned NA Notes Paper version used prior to treatment Electronic Signature(s) Signed: 01/22/2022 1:45:24 PM By: Donavan Burnet CHT EMT BS , , Entered By: Donavan Burnet on 01/22/2022 13:45:24

## 2022-01-22 NOTE — Progress Notes (Addendum)
TAKYLA, KUCHERA ([id number]) Visit Report for 01/22/2022 Arrival Information Details Patient Name: Date of Service: [name] 01/22/2022 1:00 PM Medical Record Number: [id number] Patient Account Number: [id number] Date of Birth/Sex: Treating RN: 1961-02-06 (61 y.o. [name], Theresa Primary Care [name]: [name] Other Clinician: Donavan Dillon Referring [name]: Treating [name]/Extender: [name] in Treatment: 11 Visit Information History Since Last Visit All ordered tests and consults were completed: Yes Patient Arrived: Ambulatory Added or deleted any medications: No Arrival Time: 12:26 Any new allergies or adverse reactions: No Accompanied By: self Had a fall or experienced change in No Transfer Assistance: None activities of daily living that may affect Patient Identification Verified: Yes risk of falls: Secondary Verification Process Completed: Yes Signs or symptoms of abuse/neglect since last visito No Patient Requires Transmission-Based Precautions: No Hospitalized since last visit: No Patient Has Alerts: No Implantable device outside of the clinic excluding No cellular tissue based products placed in the center since last visit: Pain Present Now: No Electronic Signature(s) Signed: 01/22/2022 1:42:08 PM By: [name] CHT EMT BS , , Entered By: [name] on 01/22/2022 13:42:08 -------------------------------------------------------------------------------- Encounter Discharge Information Details Patient Name: Date of Service: [name] 01/22/2022 1:00 PM Medical Record Number: [id number] Patient Account Number: [id number] Date of Birth/Sex: Treating RN: 1961/05/05 (61 y.o. [name] Primary Care [name]: [name] Other Clinician: Donavan Dillon Referring Eyla Tallon: Treating Babak Lucus/Extender: [name] in Treatment:  11 Encounter Discharge Information Items Discharge Condition: Stable Ambulatory Status: Ambulatory Discharge Destination: Home Transportation: Private Auto Accompanied By: self Schedule Follow-up Appointment: No Clinical Summary of Care: Electronic Signature(s) Signed: 01/22/2022 3:56:44 PM By: [name] CHT EMT BS , , Entered By: [name] on 01/22/2022 15:56:44 -------------------------------------------------------------------------------- Vitals Details Patient Name: Date of Service: [name] 01/22/2022 1:00 PM Medical Record Number: [id number] Patient Account Number: [id number] Date of Birth/Sex: Treating RN: Dec 26, 1960 (61 y.o. [name] Primary Care Sofija Antwi: [name] Other Clinician: Donavan Dillon Referring Indi Willhite: Treating Rayna Brenner/Extender: [name] Weeks in Treatment: 11 Vital Signs Time Taken: 12:30 Temperature (F): 98.0 Height (in): 60 Pulse (bpm): 107 Weight (lbs): 88 Respiratory Rate (breaths/min): 18 Body Mass Index (BMI): 17.2 Blood Pressure (mmHg): 112/86 Reference Range: 80 - 120 mg / dl Electronic Signature(s) Signed: 01/22/2022 1:43:01 PM By: [name] CHT EMT BS , , Entered By: [name] on 01/22/2022 13:43:01

## 2022-01-23 ENCOUNTER — Encounter (HOSPITAL_BASED_OUTPATIENT_CLINIC_OR_DEPARTMENT_OTHER): Payer: Medicare Other | Admitting: General Surgery

## 2022-01-23 DIAGNOSIS — M272 Inflammatory conditions of jaws: Secondary | ICD-10-CM | POA: Diagnosis not present

## 2022-01-23 NOTE — Progress Notes (Addendum)
[name], [name] ([id number]) Visit Report for 01/23/2022 Arrival Information Details Patient Name: Date of Service: [name] 01/23/2022 1:00 PM Medical Record Number: [id number] Patient Account Number: [id number] Date of Birth/Sex: Treating RN: 11/18/1960 (61 y.o. [name] Primary Care Nitasha Jewel: [name] Other Clinician: Donavan Burnet Referring Alyvia Derk: Treating Mel Langan/Extender: Mathews Argyle in Treatment: 11 Visit Information History Since Last Visit All ordered tests and consults were completed: Yes Patient Arrived: Ambulatory Added or deleted any medications: No Arrival Time: 12:11 Any new allergies or adverse reactions: No Accompanied By: None Had a fall or experienced change in No Transfer Assistance: None activities of daily living that may affect Patient Identification Verified: Yes risk of falls: Secondary Verification Process Completed: Yes Signs or symptoms of abuse/neglect since last visito No Patient Requires Transmission-Based Precautions: No Hospitalized since last visit: No Patient Has Alerts: No Implantable device outside of the clinic excluding No cellular tissue based products placed in the center since last visit: Pain Present Now: No Electronic Signature(s) Signed: 01/23/2022 2:46:50 PM By: [name] EMT Entered By: [name] on 01/23/2022 14:46:49 -------------------------------------------------------------------------------- Encounter Discharge Information Details Patient Name: Date of Service: [name] 01/23/2022 1:00 PM Medical Record Number: [id number] Patient Account Number: [id number] Date of Birth/Sex: Treating RN: [birth year] (61 y.o. [name] Primary Care [name]: [name] Other Clinician: [name] Referring [name]: Treating Nyra Anspaugh/Extender: Mathews Argyle in Treatment: 11 Encounter Discharge  Information Items Discharge Condition: Stable Ambulatory Status: Ambulatory Discharge Destination: Home Transportation: Private Auto Accompanied By: None Schedule Follow-up Appointment: Yes Clinical Summary of Care: Electronic Signature(s) Signed: 01/23/2022 3:03:09 PM By: [name] EMT Entered By: [name] on 01/23/2022 15:03:08 -------------------------------------------------------------------------------- Vitals Details Patient Name: Date of Service: [name] 01/23/2022 1:00 PM Medical Record Number: [id number] Patient Account Number: [id number] Date of Birth/Sex: Treating RN: [birth year] (61 y.o. [name] Primary Care [name]: [name] Other Clinician: [name] Referring [name]: Treating Vernetta Dizdarevic/Extender: [name] Weeks in Treatment: 11 Vital Signs Time Taken: 12:28 Temperature (F): 98.4 Height (in): 60 Pulse (bpm): 79 Weight (lbs): 88 Respiratory Rate (breaths/min): 16 Body Mass Index (BMI): 17.2 Blood Pressure (mmHg): 102/71 Reference Range: 80 - 120 mg / dl Electronic Signature(s) Signed: 01/23/2022 2:48:30 PM By: [name] EMT Entered By: [name] on 01/23/2022 14:48:29

## 2022-01-23 NOTE — Progress Notes (Addendum)
SIMAR, POTHIER ([id number]) Visit Report for 01/23/2022 HBO Details Patient Name: Date of Service: [name] 01/23/2022 1:00 PM Medical Record Number: [id number] Patient Account Number: [id number] Date of Birth/Sex: Treating RN: 02-12-1961 (61 y.o. 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, Jaw HBO Treatment Details Treatment Number: 48 Patient Type: Outpatient Chamber Type: Monoplace Chamber Serial #: [id number] Treatment Protocol: 2.5 ATA with 90 minutes oxygen, with two 5 minute air breaks Treatment Details Compression Rate Down: 2.0 psi / minute De-Compression Rate Up: 2.0 psi / minute A breaks and breathing ir Compress Tx Pressure periods Decompress Decompress Begins Reached (leave unused spaces Begins Ends blank) Chamber Pressure (ATA 1 2.5 2.5 2.5 2.5 2.5 - - 2.5 1 ) Clock Time (24 hr) 12:33 12:49 13:19 13:24 13:54 13:59 - - 14:29 14:38 Treatment Length: 125 (minutes) Treatment Segments: 4 Vital Signs Capillary Blood Glucose Reference Range: 80 - 120 mg / dl HBO Diabetic Blood Glucose Intervention Range: <131 mg/dl or >249 mg/dl Time Vitals Blood Respiratory Capillary Blood Glucose Pulse Action Type: Pulse: Temperature: Taken: Pressure: Rate: Glucose (mg/dl): Meter #: Oximetry (%) Taken: Pre 12:28 102/71 79 16 98.4 Post 14:41 133/90 66 18 98.1 Treatment Response Treatment Toleration: Well Treatment Completion Status: Treatment Completed without Adverse Event Physician HBO Attestation: I certify that I supervised this HBO treatment in accordance with Medicare guidelines. A trained  emergency response team is readily available per Yes hospital policies and procedures. Continue HBOT as ordered. Yes Electronic Signature(s) Signed: 01/23/2022 3:46:13 PM By: [name] MD FACS Previous Signature: 01/23/2022 3:01:43 PM Version By: [name] EMT Entered By: [name] on 01/23/2022 15:46:13 -------------------------------------------------------------------------------- HBO Safety Checklist Details Patient Name: Date of Service: [name] 01/23/2022 1:00 PM Medical Record Number: [id number] Patient Account Number: [id number] Date of Birth/Sex: Treating RN: May 21, 1961 (61 y.o. [name], [name] Primary Care Reneshia Zuccaro: [name] Other Clinician: [name] Referring Eugene Isadore: Treating Jimmylee Ratterree/Extender: [name] Weeks in Treatment: 11 HBO Safety Checklist Items Safety Checklist Consent Form Signed Patient voided / foley secured and emptied When did you last eato 1030 Last dose of injectable or oral agent NA Ostomy pouch emptied and vented if applicable NA All implantable devices assessed, documented and approved NA Intravenous access site secured and place NA Valuables secured Linens and cotton and cotton/polyester blend (less than 51% polyester) Personal oil-based products / skin lotions / body lotions removed Wigs or hairpieces removed NA Smoking or tobacco materials removed Books / newspapers / magazines / loose paper removed Cologne, aftershave, perfume and deodorant removed Jewelry removed (may wrap wedding band) NA Make-up removed Hair care products removed Battery operated devices (external) removed Heating patches and chemical warmers removed Titanium eyewear removed NA Nail polish cured greater than 10 hours 2 weeks ago Casting material cured greater than 10 hours NA Hearing aids removed NA Loose dentures or partials removed NA Prosthetics have been removed NA Patient  demonstrates correct use of air break device (if applicable) Patient concerns have been addressed Patient grounding bracelet on and cord attached to chamber Specifics for Inpatients (complete in addition to above) Medication sheet sent with patient NA Intravenous medications needed or due during therapy sent with patient  NA Drainage tubes (e.g. nasogastric tube or chest tube secured and vented) NA Endotracheal or Tracheotomy tube secured NA Cuff deflated of air and inflated with saline NA Airway suctioned NA Notes The safety checklist was done before the treatment was started. Electronic Signature(s) Signed: 01/23/2022 2:59:57 PM By: [name] EMT Entered By: [name] on 01/23/2022 14:59:57

## 2022-01-23 NOTE — Progress Notes (Signed)
[name], [name] ([id number]) Visit Report for 01/23/2022 Problem List Details Patient Name: Date of Service: [name] 01/23/2022 1:00 PM Medical Record Number: [id number] Patient Account Number: [id number] Date of Birth/Sex: Treating RN: 01-25-1961 (61 y.o. 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 01/23/2022 Medical Record Number: [id number] Patient Account Number: [id number] Date of Birth/Sex: Treating RN: 01/01/1961 (61 y.o. 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

## 2022-01-24 ENCOUNTER — Encounter (HOSPITAL_BASED_OUTPATIENT_CLINIC_OR_DEPARTMENT_OTHER): Payer: Medicare Other | Admitting: General Surgery

## 2022-01-24 DIAGNOSIS — M272 Inflammatory conditions of jaws: Secondary | ICD-10-CM | POA: Diagnosis not present

## 2022-01-24 NOTE — Progress Notes (Addendum)
ELEXIS, POLLAK ([id number]) Visit Report for 01/24/2022 HBO Details Patient Name: Date of Service: [name] 01/24/2022 1:00 PM Medical Record Number: [id number] Patient Account Number: [id number] Date of Birth/Sex: Treating RN: [birth year] (61 y.o. 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, Jaw HBO Treatment Details Treatment Number: 49 Patient Type: Outpatient Chamber Type: Monoplace Chamber Serial #: [id number] Treatment Protocol: 2.5 ATA with 90 minutes oxygen, with two 5 minute air breaks Treatment Details Compression Rate Down: 2.0 psi / minute De-Compression Rate Up: 2.0 psi / minute A breaks and breathing ir Compress Tx Pressure periods Decompress Decompress Begins Reached (leave unused spaces Begins Ends blank) Chamber Pressure (ATA 1 2.5 2.5 2.5 2.5 2.5 - - 2.5 1 ) Clock Time (24 hr) 12:20 12:32 13:02 13:07 13:37 13:42 - - 14:12 14:23 Treatment Length: 123 (minutes) Treatment Segments: 4 Vital Signs Capillary Blood Glucose Reference Range: 80 - 120 mg / dl HBO Diabetic Blood Glucose Intervention Range: <131 mg/dl or >249 mg/dl Type: Time Vitals Blood Pulse: Respiratory Temperature: Capillary Blood Glucose Pulse Action Taken: Pressure: Rate: Glucose (mg/dl): Meter #: Oximetry (%) Taken: Pre 12:14 98/72 92 20 88.5 systolic BP below 027 mmHg, asymptomatic Post 14:26 144/82 65 18 97.9 none per protocol Treatment Response Treatment Toleration: Well Treatment Completion Status: Treatment Completed without Adverse Event Physician HBO Attestation: I certify that I supervised this HBO treatment  in accordance with Medicare guidelines. A trained emergency response team is readily available per Yes hospital policies and procedures. Continue HBOT as ordered. Yes Electronic Signature(s) Signed: 01/24/2022 3:26:22 PM By: [name] MD FACS Previous Signature: 01/24/2022 2:38:24 PM Version By: Donavan Burnet CHT EMT BS , , Previous Signature: 01/24/2022 2:12:27 PM Version By: Donavan Burnet CHT EMT BS , , Previous Signature: 01/24/2022 2:09:52 PM Version By: Donavan Burnet CHT EMT BS , , Previous Signature: 01/24/2022 2:08:49 PM Version By: Donavan Burnet CHT EMT BS , , Entered By: [name] on 01/24/2022 15:26:21 -------------------------------------------------------------------------------- HBO Safety Checklist Details Patient Name: Date of Service: [name] 01/24/2022 1:00 PM Medical Record Number: [id number] Patient Account Number: [id number] Date of Birth/Sex: Treating RN: 03-Mar-1961 (61 y.o. [name] Primary Care Linus Weckerly: [name] Other Clinician: Donavan Burnet Referring Wesson Stith: Treating Phaedra Colgate/Extender: [name] Weeks in Treatment: 11 HBO Safety Checklist Items Safety Checklist Consent Form Signed Patient voided / foley secured and emptied When did you last eato 1100 Last dose of injectable or oral agent n/a Ostomy pouch emptied and vented if applicable NA All implantable devices assessed, documented and approved NA Intravenous access site secured and place NA Valuables secured Linens and cotton and cotton/polyester blend (less than 51% polyester) Personal oil-based products / skin lotions / body lotions removed Wigs or hairpieces removed NA Smoking or tobacco materials removed NA Books / newspapers / magazines / loose paper removed Cologne, aftershave, perfume and deodorant removed Jewelry removed (may wrap wedding band) Make-up removed Hair care products  removed Battery operated devices (external) removed Heating patches and chemical warmers removed Titanium eyewear removed NA Nail polish cured greater than 10 hours Casting material cured greater than 10 hours Hearing aids removed Loose dentures or partials removed NA Prosthetics have been  removed NA Patient demonstrates correct use of air break device (if applicable) Patient concerns have been addressed Patient grounding bracelet on and cord attached to chamber Specifics for Inpatients (complete in addition to above) Medication sheet sent with patient NA Intravenous medications needed or due during therapy sent with patient NA Drainage tubes (e.g. nasogastric tube or chest tube secured and vented) NA Endotracheal or Tracheotomy tube secured NA Cuff deflated of air and inflated with saline NA Airway suctioned NA Notes Paper version used prior to treatment. Electronic Signature(s) Signed: 01/24/2022 2:12:10 PM By: Donavan Burnet CHT EMT BS , , Previous Signature: 01/24/2022 2:07:24 PM Version By: Donavan Burnet CHT EMT BS , , Entered By: Donavan Burnet on 01/24/2022 14:12:10

## 2022-01-24 NOTE — Progress Notes (Addendum)
[name], [name] ([id number]) Visit Report for 01/24/2022 Arrival Information Details Patient Name: Date of Service: Theresa LLA Theresa Alexander D. 01/24/2022 1:00 PM Medical Record Number: [id number] Patient Account Number: [id number] Date of Birth/Sex: Treating RN: 04/10/1961 (61 y.o. [name], [name] Primary Care Laryn Venning: [name] Other Clinician: Donavan Burnet Referring Luzelena Heeg: Treating Azalyn Sliwa/Extender: Mathews Argyle in Treatment: 11 Visit Information History Since Last Visit All ordered tests and consults were completed: Yes Patient Arrived: Ambulatory Added or deleted any medications: No Arrival Time: 12:07 Any new allergies or adverse reactions: No Accompanied By: self Had a fall or experienced change in No Transfer Assistance: Other activities of daily living that may affect Patient Identification Verified: Yes risk of falls: Secondary Verification Process Completed: Yes Signs or symptoms of abuse/neglect since last visito No Patient Requires Transmission-Based Precautions: No Hospitalized since last visit: No Patient Has Alerts: No Implantable device outside of the clinic excluding No cellular tissue based products placed in the center since last visit: Pain Present Now: No Electronic Signature(s) Signed: 01/24/2022 2:11:24 PM By: Donavan Burnet CHT EMT BS , , Previous Signature: 01/24/2022 2:04:53 PM Version By: Donavan Burnet CHT EMT BS , , Entered By: Donavan Burnet on 01/24/2022 14:11:24 -------------------------------------------------------------------------------- Encounter Discharge Information Details Patient Name: Date of Service: Theresa LLA HA Theresa Bienenstock D. 01/24/2022 1:00 PM Medical Record Number: [id number] Patient Account Number: [id number] Date of Birth/Sex: Treating RN: 05-Feb-1961 (61 y.o. [name] Primary Care Madden Garron: [name] Other Clinician: Donavan Burnet Referring  Cleva Camero: Treating Eljay Lave/Extender: Mathews Argyle in Treatment: 11 Encounter Discharge Information Items Discharge Condition: Stable Ambulatory Status: Ambulatory Discharge Destination: Home Transportation: Private Auto Accompanied By: self Schedule Follow-up Appointment: No Clinical Summary of Care: Electronic Signature(s) Signed: 01/24/2022 2:39:16 PM By: Donavan Burnet CHT EMT BS , , Entered By: Donavan Burnet on 01/24/2022 14:39:16 -------------------------------------------------------------------------------- Venetie Details Patient Name: Date of Service: Theresa LLA Theresa Alexander D. 01/24/2022 1:00 PM Medical Record Number: [id number] Patient Account Number: [id number] Date of Birth/Sex: Treating RN: Jul 28, 1960 (61 y.o. [name] Primary Care Yadira Hada: [name] Other Clinician: Donavan Burnet Referring Lakiya Cottam: Treating Jazzlene Huot/Extender: Mathews Argyle in Treatment: 11 Vital Signs Time Taken: 12:14 Temperature (F): 98.8 Height (in): 60 Pulse (bpm): 92 Weight (lbs): 88 Respiratory Rate (breaths/min): 20 Body Mass Index (BMI): 17.2 Blood Pressure (mmHg): 98/72 Reference Range: 80 - 120 mg / dl Electronic Signature(s) Signed: 01/24/2022 2:11:33 PM By: Donavan Burnet CHT EMT BS , , Previous Signature: 01/24/2022 2:05:18 PM Version By: Donavan Burnet CHT EMT BS , , Entered By: Donavan Burnet on 01/24/2022 14:11:32

## 2022-01-24 NOTE — Progress Notes (Signed)
[name], [name] ([id number]) Visit Report for 01/22/2022 SuperBill Details Patient Name: Date of Service: [name] 01/22/2022 Medical Record Number: [id number] Patient Account Number: [id number] Date of Birth/Sex: Treating RN: Jan 28, 1961 (61 y.o. 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

## 2022-01-24 NOTE — Progress Notes (Signed)
[name], [name] ([id number]) Visit Report for 01/24/2022 SuperBill Details Patient Name: Date of Service: [name] 01/24/2022 Medical Record Number: [id number] Patient Account Number: [id number] Date of Birth/Sex: Treating RN: 1961-01-01 (61 y.o. 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

## 2022-01-25 ENCOUNTER — Encounter (HOSPITAL_BASED_OUTPATIENT_CLINIC_OR_DEPARTMENT_OTHER): Payer: Medicare Other | Admitting: General Surgery

## 2022-01-25 DIAGNOSIS — M272 Inflammatory conditions of jaws: Secondary | ICD-10-CM | POA: Diagnosis not present

## 2022-01-25 NOTE — Progress Notes (Signed)
ORELIA, BRANDSTETTER ([id number]) Visit Report for 01/25/2022 SuperBill Details Patient Name: Date of Service: [name] 01/25/2022 Medical Record Number: [id number] Patient Account Number: [id number] Date of Birth/Sex: Treating RN: July 09, 1960 (61 y.o. 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

## 2022-01-25 NOTE — Progress Notes (Addendum)
[name], [name] ([id number]) Visit Report for 01/25/2022 Arrival Information Details Patient Name: Date of Service: [name] Theresa Alexander D. 01/25/2022 1:00 PM Medical Record Number: [id number] Patient Account Number: [id number] Date of Birth/Sex: Treating RN: 1961/02/25 (61 y.o. [name], [name] Primary Care Emmalyne Giacomo: [name] Other Clinician: [name] Referring Maryl Blalock: Treating Salvatore Poe/Extender: Mathews Argyle in Treatment: 11 Visit Information History Since Last Visit All ordered tests and consults were completed: Yes Patient Arrived: Ambulatory Added or deleted any medications: No Arrival Time: 12:17 Any new allergies or adverse reactions: No Accompanied By: None Had a fall or experienced change in No Transfer Assistance: None activities of daily living that may affect Patient Identification Verified: Yes risk of falls: Secondary Verification Process Completed: Yes Signs or symptoms of abuse/neglect since last visito No Patient Requires Transmission-Based Precautions: No Hospitalized since last visit: No Patient Has Alerts: No Implantable device outside of the clinic excluding No cellular tissue based products placed in the center since last visit: Pain Present Now: No Electronic Signature(s) Signed: 01/25/2022 2:24:10 PM By: [name] EMT Entered By: [name] on 01/25/2022 14:24:10 -------------------------------------------------------------------------------- Encounter Discharge Information Details Patient Name: Date of Service: [name] HA Theresa Bienenstock D. 01/25/2022 1:00 PM Medical Record Number: [id number] Patient Account Number: [id number] Date of Birth/Sex: Treating RN: 14-Dec-1960 (61 y.o. [name] Primary Care Belva Koziel: [name] Other Clinician: [name] Referring Saulo Anthis: Treating Karel Mowers/Extender: Mathews Argyle in Treatment: 11 Encounter Discharge Information  Items Discharge Condition: Stable Ambulatory Status: Ambulatory Discharge Destination: Home Transportation: Private Auto Accompanied By: None Schedule Follow-up Appointment: Yes Clinical Summary of Care: Electronic Signature(s) Signed: 01/25/2022 2:44:09 PM By: [name] EMT Entered By: [name] on 01/25/2022 14:44:09 -------------------------------------------------------------------------------- Vitals Details Patient Name: Date of Service: [name] Theresa Alexander D. 01/25/2022 1:00 PM Medical Record Number: [id number] Patient Account Number: [id number] Date of Birth/Sex: Treating RN: September 19, 1960 (61 y.o. [name], [name] Primary Care Gabreille Dardis: [name] Other Clinician: [name] Referring Johnanthony Wilden: Treating Gustavo Dispenza/Extender: [name] Weeks in Treatment: 11 Vital Signs Time Taken: 12:21 Temperature (F): 98.4 Height (in): 60 Pulse (bpm): 77 Weight (lbs): 88 Respiratory Rate (breaths/min): 16 Body Mass Index (BMI): 17.2 Blood Pressure (mmHg): 112/77 Reference Range: 80 - 120 mg / dl Electronic Signature(s) Signed: 01/25/2022 2:24:43 PM By: [name] EMT Entered By: [name] on 01/25/2022 14:24:43

## 2022-01-25 NOTE — Progress Notes (Addendum)
EDISON, WOLLSCHLAGER ([id number]) Visit Report for 01/25/2022 HBO Details Patient Name: Date of Service: [name] 01/25/2022 1:00 PM Medical Record Number: [id number] Patient Account Number: [id number] Date of Birth/Sex: Treating RN: 15-Sep-1960 (61 y.o. 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, Jaw HBO Treatment Details Treatment Number: 50 Patient Type: Outpatient Chamber Type: Monoplace Chamber Serial #: [id number] Treatment Protocol: 2.5 ATA with 90 minutes oxygen, with two 5 minute air breaks Treatment Details Compression Rate Down: 2.0 psi / minute De-Compression Rate Up: 2.0 psi / minute A breaks and breathing ir Compress Tx Pressure periods Decompress Decompress Begins Reached (leave unused spaces Begins Ends blank) Chamber Pressure (ATA 1 2.5 2.5 2.5 2.5 2.5 - - 2.5 1 ) Clock Time (24 hr) 12:28 12:41 13:11 13:16 16:46 16:51 - - 14:21 14:33 Treatment Length: 125 (minutes) Treatment Segments: 4 Vital Signs Capillary Blood Glucose Reference Range: 80 - 120 mg / dl HBO Diabetic Blood Glucose Intervention Range: <131 mg/dl or >249 mg/dl Time Vitals Blood Respiratory Capillary Blood Glucose Pulse Action Type: Pulse: Temperature: Taken: Pressure: Rate: Glucose (mg/dl): Meter #: Oximetry (%) Taken: Pre 12:21 112/77 77 16 98.4 Post 14:35 127/77 64 16 97.5 Treatment Response Treatment Toleration: Well Treatment Completion Status: Treatment Completed without Adverse Event Physician HBO Attestation: I certify that I supervised this HBO treatment in accordance with Medicare guidelines. A trained  emergency response team is readily available per Yes hospital policies and procedures. Continue HBOT as ordered. Yes Electronic Signature(s) Signed: 01/25/2022 4:03:48 PM By: [name] MD FACS Previous Signature: 01/25/2022 2:43:20 PM Version By: [name] EMT Previous Signature: 01/25/2022 2:30:42 PM Version By: [name] EMT Entered By: [name] on 01/25/2022 16:03:47 -------------------------------------------------------------------------------- HBO Safety Checklist Details Patient Name: Date of Service: [name] Gearldine Dillon D. 01/25/2022 1:00 PM Medical Record Number: [id number] Patient Account Number: [id number] Date of Birth/Sex: Treating RN: [birth year] (61 y.o. [name], [name] Primary Care [name]: [name] Other Clinician: [name] Referring Ellianna Ruest: Treating Ivyanna Sibert/Extender: [name] Weeks in Treatment: 11 HBO Safety Checklist Items Safety Checklist Consent Form Signed Patient voided / foley secured and emptied When did you last eato 1100 Last dose of injectable or oral agent NA Ostomy pouch emptied and vented if applicable NA All implantable devices assessed, documented and approved NA Intravenous access site secured and place NA Valuables secured Linens and cotton and cotton/polyester blend (less than 51% polyester) Personal oil-based products / skin lotions / body lotions removed Wigs or hairpieces removed NA Smoking or tobacco materials removed Books / newspapers / magazines / loose paper removed Cologne, aftershave, perfume and deodorant removed Jewelry removed (may wrap wedding band) NA Make-up removed Hair care products removed Battery operated devices (external) removed Heating patches and chemical warmers removed Titanium eyewear removed NA Nail polish cured greater than 10 hours Casting material cured greater than 10 hours NA Hearing aids removed NA Loose dentures or partials  removed NA Prosthetics have been removed NA Patient demonstrates correct use of air break device (if applicable) Patient concerns have been addressed Patient grounding bracelet on and cord attached to chamber Specifics for Inpatients (complete in addition to above) Medication sheet sent with patient NA Intravenous medications needed  or due during therapy sent with patient NA Drainage tubes (e.g. nasogastric tube or chest tube secured and vented) NA Endotracheal or Tracheotomy tube secured NA Cuff deflated of air and inflated with saline NA Airway suctioned NA Notes The safety checklist was done before the treatment was started. Electronic Signature(s) Signed: 01/25/2022 2:26:20 PM By: [name] EMT Entered By: [name] on 01/25/2022 14:26:19

## 2022-01-28 ENCOUNTER — Encounter (HOSPITAL_BASED_OUTPATIENT_CLINIC_OR_DEPARTMENT_OTHER): Payer: Medicare Other | Admitting: General Surgery

## 2022-01-28 DIAGNOSIS — M272 Inflammatory conditions of jaws: Secondary | ICD-10-CM | POA: Diagnosis not present

## 2022-01-28 NOTE — Progress Notes (Addendum)
Theresa, Dillon ([id number]) Visit Report for 01/28/2022 HBO Details Patient Name: Date of Service: [name] 01/28/2022 1:00 PM Medical Record Number: [id number] Patient Account Number: [id number] Date of Birth/Sex: Treating RN: 04-21-1961 (61 y.o. 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, Jaw HBO Treatment Details Treatment Number: 51 Patient Type: Outpatient Chamber Type: Monoplace Chamber Serial #: [id number] Treatment Protocol: 2.5 ATA with 90 minutes oxygen, with two 5 minute air breaks Treatment Details Compression Rate Down: 2.0 psi / minute De-Compression Rate Up: 2.0 psi / minute A breaks and breathing ir Compress Tx Pressure periods Decompress Decompress Begins Reached (leave unused spaces Begins Ends blank) Chamber Pressure (ATA 1 2.5 2.5 2.5 2.5 2.5 - - 2.5 1 ) Clock Time (24 hr) 12:32 12:43 13:13 13:18 13:48 13:53 - - 14:23 14:36 Treatment Length: 124 (minutes) Treatment Segments: 4 Vital Signs Capillary Blood Glucose Reference Range: 80 - 120 mg / dl HBO Diabetic Blood Glucose Intervention Range: <131 mg/dl or >249 mg/dl Type: Time Vitals Blood Respiratory Capillary Blood Glucose Pulse Action Pulse: Temperature: Taken: Pressure: Rate: Glucose (mg/dl): Meter #: Oximetry (%) Taken: Pre 12:26 115/78 90 18 98.2 none per protocol Post 14:39 117/75 71 18 97.7 none per protocol Treatment Response Treatment Toleration: Well Treatment Completion Status: Treatment Completed without Adverse Event Physician HBO Attestation: I certify that I supervised this HBO treatment in accordance  with Medicare guidelines. A trained emergency response team is readily available per Yes hospital policies and procedures. Continue HBOT as ordered. Yes Electronic Signature(s) Signed: 01/28/2022 4:07:32 PM By: [name] MD FACS Previous Signature: 01/28/2022 3:53:58 PM Version By: Donavan Burnet CHT EMT BS , , Entered By: [name] on 01/28/2022 16:07:32 -------------------------------------------------------------------------------- HBO Safety Checklist Details Patient Name: Date of Service: [name] Gearldine Dillon D. 01/28/2022 1:00 PM Medical Record Number: [id number] Patient Account Number: [id number] Date of Birth/Sex: Treating RN: May 05, 1961 (61 y.o. [name] Primary Care [name]: [name] Other Clinician: Donavan Burnet Referring [name]: Treating [name]/Extender: [name] Weeks in Treatment: 12 HBO Safety Checklist Items Safety Checklist Consent Form Signed Patient voided / foley secured and emptied When did you last eato 1100 Last dose of injectable or oral agent n/a Ostomy pouch emptied and vented if applicable NA All implantable devices assessed, documented and approved NA Intravenous access site secured and place NA Valuables secured Linens and cotton and cotton/polyester blend (less than 51% polyester) Personal oil-based products / skin lotions / body lotions removed Wigs or hairpieces removed NA Smoking or tobacco materials removed NA Books / newspapers / magazines / loose paper removed Cologne, aftershave, perfume and deodorant removed Jewelry removed (may wrap wedding band) Make-up removed Hair care products removed Battery operated devices (external) removed Heating patches and chemical warmers removed NA Titanium eyewear removed NA Nail polish cured greater than 10 hours Casting material cured greater than 10 hours NA Hearing aids removed NA Loose dentures or partials  removed NA Prosthetics have been removed NA Patient demonstrates correct use of air break device (if applicable) Patient concerns have been addressed Patient grounding bracelet on and cord attached to chamber Specifics for Inpatients (complete in addition to above) Medication sheet sent with patient NA Intravenous medications  needed or due during therapy sent with patient NA Drainage tubes (e.g. nasogastric tube or chest tube secured and vented) NA Endotracheal or Tracheotomy tube secured NA Cuff deflated of air and inflated with saline NA Airway suctioned NA Notes Paper version used prior to treatment. Electronic Signature(s) Signed: 01/28/2022 2:06:01 PM By: Donavan Burnet CHT EMT BS , , Entered By: Donavan Burnet on 01/28/2022 14:06:01

## 2022-01-28 NOTE — Progress Notes (Addendum)
[name], [name] ([id number]) Visit Report for 01/28/2022 Arrival Information Details Patient Name: Date of Service: [name] 01/28/2022 1:00 PM Medical Record Number: [id number] Patient Account Number: [id number] Date of Birth/Sex: Treating RN: [birth year] (61 y.o. [name] Primary Care Kenzie Flakes: [name] Other Clinician: Donavan Burnet Referring Delesha Pohlman: Treating Shandrell Boda/Extender: Mathews Argyle in Treatment: 12 Visit Information History Since Last Visit All ordered tests and consults were completed: Yes Patient Arrived: Ambulatory Added or deleted any medications: No Arrival Time: 12:19 Any new allergies or adverse reactions: No Accompanied By: self Had a fall or experienced change in No Transfer Assistance: None activities of daily living that may affect Patient Identification Verified: Yes risk of falls: Secondary Verification Process Completed: Yes Signs or symptoms of abuse/neglect since last visito No Patient Requires Transmission-Based Precautions: No Hospitalized since last visit: No Patient Has Alerts: No Implantable device outside of the clinic excluding No cellular tissue based products placed in the center since last visit: Pain Present Now: No Electronic Signature(s) Signed: 01/28/2022 2:04:23 PM By: Donavan Burnet CHT EMT BS , , Entered By: Donavan Burnet on 01/28/2022 14:04:22 -------------------------------------------------------------------------------- Encounter Discharge Information Details Patient Name: Date of Service: [name] 01/28/2022 1:00 PM Medical Record Number: [id number] Patient Account Number: [id number] Date of Birth/Sex: Treating RN: [birth year] (61 y.o. [name] Primary Care Nate Common: [name] Other Clinician: Donavan Burnet Referring Padraic Marinos: Treating Kikuye Korenek/Extender: Mathews Argyle in Treatment:  12 Encounter Discharge Information Items Discharge Condition: Stable Ambulatory Status: Ambulatory Discharge Destination: Home Transportation: Private Auto Accompanied By: self Schedule Follow-up Appointment: No Clinical Summary of Care: Electronic Signature(s) Signed: 01/28/2022 3:55:06 PM By: Donavan Burnet CHT EMT BS , , Entered By: Donavan Burnet on 01/28/2022 15:55:06 -------------------------------------------------------------------------------- Vitals Details Patient Name: Date of Service: [name] 01/28/2022 1:00 PM Medical Record Number: [id number] Patient Account Number: [id number] Date of Birth/Sex: Treating RN: June 14, 1961 (61 y.o. [name] Primary Care Sujata Maines: [name] Other Clinician: Donavan Burnet Referring Latonia Conrow: Treating Milani Lowenstein/Extender: Mathews Argyle in Treatment: 12 Vital Signs Time Taken: 12:26 Temperature (F): 98.2 Height (in): 60 Pulse (bpm): 90 Weight (lbs): 88 Respiratory Rate (breaths/min): 18 Body Mass Index (BMI): 17.2 Blood Pressure (mmHg): 115/78 Reference Range: 80 - 120 mg / dl Electronic Signature(s) Signed: 01/28/2022 2:04:42 PM By: Donavan Burnet CHT EMT BS , , Entered By: Donavan Burnet on 01/28/2022 14:04:42

## 2022-01-28 NOTE — Progress Notes (Signed)
ANITRA, DOXTATER ([id number]) Visit Report for 01/28/2022 SuperBill Details Patient Name: Date of Service: [name] 01/28/2022 Medical Record Number: [id number] Patient Account Number: [id number] Date of Birth/Sex: Treating RN: [birth year] (61 y.o. 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

## 2022-01-29 ENCOUNTER — Encounter (HOSPITAL_BASED_OUTPATIENT_CLINIC_OR_DEPARTMENT_OTHER): Payer: Medicare Other | Attending: General Surgery | Admitting: General Surgery

## 2022-01-29 DIAGNOSIS — M8788 Other osteonecrosis, other site: Secondary | ICD-10-CM | POA: Diagnosis present

## 2022-01-29 DIAGNOSIS — Z85810 Personal history of malignant neoplasm of tongue: Secondary | ICD-10-CM | POA: Diagnosis not present

## 2022-01-29 DIAGNOSIS — Z9221 Personal history of antineoplastic chemotherapy: Secondary | ICD-10-CM | POA: Diagnosis not present

## 2022-01-29 DIAGNOSIS — Y842 Radiological procedure and radiotherapy as the cause of abnormal reaction of the patient, or of later complication, without mention of misadventure at the time of the procedure: Secondary | ICD-10-CM | POA: Insufficient documentation

## 2022-01-29 DIAGNOSIS — Z85830 Personal history of malignant neoplasm of bone: Secondary | ICD-10-CM | POA: Diagnosis not present

## 2022-01-29 DIAGNOSIS — Z8589 Personal history of malignant neoplasm of other organs and systems: Secondary | ICD-10-CM | POA: Diagnosis not present

## 2022-01-29 DIAGNOSIS — M272 Inflammatory conditions of jaws: Secondary | ICD-10-CM | POA: Insufficient documentation

## 2022-01-29 NOTE — Progress Notes (Addendum)
[name], [name] ([id number]) Visit Report for 01/29/2022 HBO Details Patient Name: Date of Service: [name] 01/29/2022 1:00 PM Medical Record Number: [id number] Patient Account Number: [id number] Date of Birth/Sex: Treating RN: Apr 24, 1961 (61 y.o. 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, Jaw HBO Treatment Details Treatment Number: 52 Patient Type: Outpatient Chamber Type: Monoplace Chamber Serial #: [id number] Treatment Protocol: 2.5 ATA with 90 minutes oxygen, with two 5 minute air breaks Treatment Details Compression Rate Down: 2.0 psi / minute De-Compression Rate Up: 2.0 psi / minute A breaks and breathing ir Compress Tx Pressure periods Decompress Decompress Begins Reached (leave unused spaces Begins Ends blank) Chamber Pressure (ATA 1 2.5 2.5 2.5 2.5 2.5 - - 2.5 1 ) Clock Time (24 hr) 12:25 12:35 13:05 13:11 13:40 13:45 - - 14:15 14:26 Treatment Length: 121 (minutes) Treatment Segments: 4 Vital Signs Capillary Blood Glucose Reference Range: 80 - 120 mg / dl HBO Diabetic Blood Glucose Intervention Range: <131 mg/dl or >249 mg/dl Time Vitals Blood Respiratory Capillary Blood Glucose Pulse Action Type: Pulse: Temperature: Taken: Pressure: Rate: Glucose (mg/dl): Meter #: Oximetry (%) Taken: Pre 12:19 125/76 86 16 98.3 Post 14:29 107/80 65 14 98.1 Treatment Response Treatment Toleration: Well Treatment Completion Status: Treatment Completed without Adverse Event Physician HBO Attestation: I certify that I supervised this HBO treatment in accordance with Medicare guidelines. A trained  emergency response team is readily available per Yes hospital policies and procedures. Continue HBOT as ordered. Yes Electronic Signature(s) Signed: 01/29/2022 3:48:00 PM By: [name] MD FACS Previous Signature: 01/29/2022 2:53:43 PM Version By: [name] EMT Entered By: [name] on 01/29/2022 15:48:00 -------------------------------------------------------------------------------- HBO Safety Checklist Details Patient Name: Date of Service: [name] 01/29/2022 1:00 PM Medical Record Number: [id number] Patient Account Number: [id number] Date of Birth/Sex: Treating RN: [birth year] (61 y.o. [name], [name] Primary Care Delroy Ordway: [name] Other Clinician: [name] Referring Xavior Niazi: Treating Edda Orea/Extender: [name] Weeks in Treatment: 12 HBO Safety Checklist Items Safety Checklist Consent Form Signed Patient voided / foley secured and emptied When did you last eato 1030 Last dose of injectable or oral agent NA Ostomy pouch emptied and vented if applicable NA All implantable devices assessed, documented and approved NA Intravenous access site secured and place NA Valuables secured Linens and cotton and cotton/polyester blend (less than 51% polyester) Personal oil-based products / skin lotions / body lotions removed Wigs or hairpieces removed NA Smoking or tobacco materials removed Books / newspapers / magazines / loose paper removed Cologne, aftershave, perfume and deodorant removed Jewelry removed (may wrap wedding band) NA Make-up removed Hair care products removed Battery operated devices (external) removed Heating patches and chemical warmers removed Titanium eyewear removed NA Nail polish cured greater than 10 hours Casting material cured greater than 10 hours NA Hearing aids removed NA Loose dentures or partials removed NA Prosthetics have been removed NA Patient demonstrates correct  use of air break device (if applicable) Patient concerns have been addressed Patient grounding bracelet on and cord attached to chamber Specifics for Inpatients (complete in addition to above) Medication sheet sent with patient NA Intravenous medications needed or due during therapy sent with patient NA Drainage tubes (  e.g. nasogastric tube or chest tube secured and vented) NA Endotracheal or Tracheotomy tube secured NA Cuff deflated of air and inflated with saline NA Airway suctioned Notes The safety checklist was done before the treatment was started. Electronic Signature(s) Signed: 01/29/2022 2:51:53 PM By: [name] EMT Entered By: [name] on 01/29/2022 14:51:53

## 2022-01-29 NOTE — Progress Notes (Signed)
CHASTIN, GARLITZ ([id number]) Visit Report for 01/29/2022 Arrival Information Details Patient Name: Date of Service: [name] 01/29/2022 1:00 PM Medical Record Number: [id number] Patient Account Number: [id number] Date of Birth/Sex: Treating RN: [birth year] (61 y.o. [name], [name] Primary Care Garrie Elenes: [name] Other Clinician: [name] Referring Tammy Wickliffe: Treating Marilla Boddy/Extender: Mathews Argyle in Treatment: 12 Visit Information History Since Last Visit All ordered tests and consults were completed: Yes Patient Arrived: Ambulatory Added or deleted any medications: No Arrival Time: 12:12 Any new allergies or adverse reactions: No Accompanied By: None Had a fall or experienced change in No Transfer Assistance: None activities of daily living that may affect Patient Identification Verified: Yes risk of falls: Secondary Verification Process Completed: Yes Signs or symptoms of abuse/neglect since last visito No Patient Requires Transmission-Based Precautions: No Hospitalized since last visit: No Patient Has Alerts: No Implantable device outside of the clinic excluding No cellular tissue based products placed in the center since last visit: Pain Present Now: No Electronic Signature(s) Signed: 01/29/2022 2:49:05 PM By: [name] EMT Entered By: [name] on 01/29/2022 14:49:05 -------------------------------------------------------------------------------- Encounter Discharge Information Details Patient Name: Date of Service: [name] 01/29/2022 1:00 PM Medical Record Number: [id number] Patient Account Number: [id number] Date of Birth/Sex: Treating RN: 17-Aug-1960 (61 y.o. [name] Primary Care Sula Fetterly: [name] Other Clinician: [name] Referring Ellanore Vanhook: Treating Quenesha Douglass/Extender: Mathews Argyle in Treatment: 12 Encounter Discharge Information  Items Discharge Condition: Stable Ambulatory Status: Ambulatory Discharge Destination: Home Transportation: Private Auto Accompanied By: None Schedule Follow-up Appointment: Yes Clinical Summary of Care: Electronic Signature(s) Signed: 01/29/2022 3:13:19 PM By: [name] EMT Entered By: [name] on 01/29/2022 15:13:19 -------------------------------------------------------------------------------- Vitals Details Patient Name: Date of Service: [name] 01/29/2022 1:00 PM Medical Record Number: [id number] Patient Account Number: [id number] Date of Birth/Sex: Treating RN: Jun 19, 1961 (61 y.o. [name], [name] Primary Care Iviana Blasingame: [name] Other Clinician: [name] Referring Meaghann Choo: Treating [place] Harral/Extender: [name] Weeks in Treatment: 12 Vital Signs Time Taken: 12:19 Temperature (F): 98.3 Height (in): 60 Pulse (bpm): 86 Weight (lbs): 88 Respiratory Rate (breaths/min): 16 Body Mass Index (BMI): 17.2 Blood Pressure (mmHg): 125/76 Reference Range: 80 - 120 mg / dl Electronic Signature(s) Signed: 01/29/2022 2:49:32 PM By: [name] EMT Entered By: [name] on 01/29/2022 14:49:32

## 2022-01-29 NOTE — Progress Notes (Signed)
[name], [name] ([id number]) Visit Report for 01/29/2022 Problem List Details Patient Name: Date of Service: [name] 01/29/2022 1:00 PM Medical Record Number: [id number] Patient Account Number: [id number] Date of Birth/Sex: Treating RN: June 03, 1961 (61 y.o. 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 01/29/2022 Medical Record Number: [id number] Patient Account Number: [id number] Date of Birth/Sex: Treating RN: 01-24-1961 (61 y.o. 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

## 2022-01-30 ENCOUNTER — Encounter (HOSPITAL_BASED_OUTPATIENT_CLINIC_OR_DEPARTMENT_OTHER): Payer: Medicare Other | Admitting: Physician Assistant

## 2022-01-30 DIAGNOSIS — M8788 Other osteonecrosis, other site: Secondary | ICD-10-CM | POA: Diagnosis not present

## 2022-01-30 NOTE — Progress Notes (Addendum)
[name], [name] ([id number]) Visit Report for 01/30/2022 Chief Complaint Document Details Patient Name: Date of Service: [name] 01/30/2022 12:45 PM Medical Record Number: [id number] Patient Account Number: [id number] Date of Birth/Sex: Treating RN: Dec 27, 1960 (61 y.o. 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 01/30/2022 12:45 PM Medical Record Number: [id number] Patient Account Number: [id number] Date of Birth/Sex: Treating RN: [birth year] (61 y.o. 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 She underwent resection with a fibular free flap in followed by a chin implant and tissue rearrangement at [facility] in June 2021. She underwent chemotherapy with cisplatin, completed in September 2021. She received radiation therapy of 66 Gray in 33 fractions which was completed in September 2021. She also previously had resection of gingival and tongue cancer with a marginal mandibulectomy. This was back in 2012. More recently, an attempt at maxillary dental implants was made. During the course of this, it was  hypothesized that she had some transient bacteremia which seeded her titanium implant. This subsequently eroded through her skin and she has a draining sinus tract. She is followed by otolaryngology at [facility]. They felt that her best chance of healing the wound and preserving her implant, specifically in the setting of radiation therapy would be a prolonged course of IV antibiotics as well as hyperbaric oxygen therapy. She was initially seen by the [facility] wound care clinic but expressed a preference to come here. Fortunately, the majority of the work-up was done by Dr. Con Memos. This included an EKG and chest x-ray. She does have a Port-A-Cath and will be initiating IV antibiotics today for 6-week course of ertapenem. She has been Referred to undergo hyperbaric oxygen therapy at our center. 12/05/2021: 30-day interval follow-up. She says that she thinks she has oral thrush from her IV antibiotics and is also complaining of a vaginal yeast infection. She has been tolerating hyperbaric oxygen therapy without difficulty. She was supposed to have posts placed for dental implants earlier this week, but the oral surgeon felt like the bone was not in good enough condition. The draining sinus beneath her mandible does not seem to be putting out much. 01/02/2022: 30-day interval follow-up. She has recovered from oral and vaginal yeast infections. She continues to tolerate hyperbaric oxygen therapy. She is feeling a little bit frustrated that her other providers are telling her that the exposed bone will just eventually fall off and the wound should close over the gap. She is not noticing any substantial drainage from the site. 01-30-2022 upon evaluation today patient appears to be doing well currently in regard to her hyperbaric treatments. She is currently on a 30-day interval will be due have to do a follow-up in just checking to see where things stand. She actually just had a repeat CT scan which is still  demonstrating osteoradionecrosis at  this point. With that being said I do believe that the patient is benefiting from what we are seeing with regards of hyperbaric oxygen therapy but she has been referred to plastic surgery which I think is absolutely a good idea as well as I believe they may be able to benefit her and help with getting this to heal much more effectively and quickly. Again I think that the hyperbarics would even benefit her following such surgery. Electronic Signature(s) Signed: 01/30/2022 1:03:41 PM By: Worthy Keeler PA-C Entered By: Worthy Keeler on 01/30/2022 13:03:41 -------------------------------------------------------------------------------- Physical Exam Details Patient Name: Date of Service: [name] 01/30/2022 12:45 PM Medical Record Number: [id number] Patient Account Number: [id number] Date of Birth/Sex: Treating RN: 01-29-1961 (61 y.o. F) Primary Care Provider: [name] Other Clinician: Referring Provider: Treating Provider/Extender: Denton Brick Weeks in Treatment: 27 Constitutional Well-nourished and well-hydrated in no acute distress. Ears, Nose, Mouth, and Throat no gross abnormality of ear auricles or external auditory canals. normal hearing noted during conversation. mucus membranes moist. Respiratory normal breathing without difficulty. Psychiatric this patient is able to make decisions and demonstrates good insight into disease process. Alert and Oriented x 3. pleasant and cooperative. Notes Upon inspection patient still has part of the mandible exposed on the right jawline. This bone is obviously necrotic at this point as well. That is the reason for the referral to plastic surgery try to see if there is anything they can do to benefit her as far as that is concerned. Otherwise her ears appear to be doing well I see no signs of issue other than having a little bit of fluid behind the eardrum on the right  side. Electronic Signature(s) Signed: 01/30/2022 1:04:35 PM By: Worthy Keeler PA-C Previous Signature: 01/30/2022 1:04:25 PM Version By: Worthy Keeler PA-C Entered By: Worthy Keeler on 01/30/2022 13:04:35 -------------------------------------------------------------------------------- Physician Orders Details Patient Name: Date of Service: [name] 01/30/2022 12:45 PM Medical Record Number: [id number] Patient Account Number: [id number] Date of Birth/Sex: Treating RN: 01-31-1961 (61 y.o. 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 Celine Ahr - room 2 - 9/4 at 12:45 PM Return A Hyperbaric Oxygen Therapy Evaluate for HBO Therapy Indication: - Osteoradionecrosis of jaw If appropriate for treatment, begin HBOT per protocol: 2.5 ATA for 90 Minutes with 2 Five (5) Minute A Breaks ir Total Number of Treatments: - 40 more treatments 01/02/22 One treatments per day (delivered Monday through Friday unless otherwise specified in Special Instructions below): A frin (Oxymetazoline HCL) 0.05% nasal spray - 1 spray in both nostrils daily as needed prior to HBO treatment for difficulty clearing ears Electronic Signature(s) Signed: 01/30/2022 5:04:53 PM By: Worthy Keeler PA-C Signed: 01/30/2022 5:11:01 PM By: Adline Peals Entered By: Adline Peals on 01/30/2022 12:56:53 -------------------------------------------------------------------------------- Problem List Details Patient Name: Date of Service: [name] 01/30/2022 12:45 PM Medical Record Number: [id number] Patient Account Number: [id number] Date of Birth/Sex: Treating RN: 03/05/1961 (61 y.o. 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 01/30/2022 12:45 PM Medical Record Number: [id number] Patient Account Number: [id number] Date of Birth/Sex: Treating RN: [birth year] (61 y.o. 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 She underwent resection with a fibular free flap in followed by a chin implant and tissue rearrangement at [facility] in June 2021. She underwent chemotherapy with cisplatin, completed in September 2021. She received radiation therapy of 66 Gray  in 33 fractions which was completed in September 2021. She also previously had resection of gingival and tongue cancer with a marginal mandibulectomy. This was back in 2012. More recently, an attempt at maxillary dental implants was made. During the course of this, it was hypothesized that she had some transient bacteremia which seeded her titanium implant. This subsequently eroded through her skin and she has a draining sinus tract. She is followed by otolaryngology at [facility]. They felt that her best chance of healing the wound and preserving her implant, specifically in the setting of radiation therapy would be a prolonged course of IV antibiotics as well as hyperbaric oxygen therapy. She was initially seen by the [facility] wound care clinic but expressed a preference to come here. Fortunately, the majority of the work-up was done by Dr. Con Memos. This included an EKG and chest x-ray. She does have a Port-A-Cath and will be initiating IV antibiotics today for 6-week course of ertapenem. She has been Referred to undergo hyperbaric oxygen therapy at our center. 12/05/2021: 30-day interval follow-up. She says that she thinks she has oral thrush from her IV antibiotics and is also complaining of a vaginal yeast infection. She has been tolerating hyperbaric oxygen therapy without difficulty. She was supposed to have posts placed for dental implants earlier this week, but the oral surgeon felt like the bone was not in good enough condition. The draining sinus beneath her mandible does not seem to be putting out much. 01/02/2022: 30-day interval follow-up. She has recovered from oral and vaginal yeast infections. She continues to tolerate hyperbaric oxygen therapy. She is feeling a little bit frustrated that her other providers are telling her that the exposed bone will just eventually fall off and the wound should close over the gap. She is not noticing any substantial drainage from the site. 01-30-2022 upon  evaluation today patient appears to be doing well currently in regard to her hyperbaric treatments. She is currently on a 30-day interval will be due have to do a follow-up in just checking to see where things stand. She actually just had a repeat CT scan which is still demonstrating osteoradionecrosis at this point. With  that being said I do believe that the patient is benefiting from what we are seeing with regards of hyperbaric oxygen therapy but she has been referred to plastic surgery which I think is absolutely a good idea as well as I believe they may be able to benefit her and help with getting this to heal much more effectively and quickly. Again I think that the hyperbarics would even benefit her following such surgery. Objective Constitutional Well-nourished and well-hydrated in no acute distress. Vitals Time Taken: 12:49 PM, Height: 60 in, Weight: 88 lbs, BMI: 17.2, Temperature: 97.9 F, Pulse: 63 bpm, Respiratory Rate: 16 breaths/min, Blood Pressure: 133/84 mmHg. Ears, Nose, Mouth, and Throat no gross abnormality of ear auricles or external auditory canals. normal hearing noted during conversation. mucus membranes moist. Respiratory normal breathing without difficulty. Psychiatric this patient is able to make decisions and demonstrates good insight into disease process. Alert and Oriented x 3. pleasant and cooperative. General Notes: Upon inspection patient still has part of the mandible exposed on the right jawline. This bone is obviously necrotic at this point as well. That is the reason for the referral to plastic surgery try to see if there is anything they can do to benefit her as far as that is concerned. Otherwise her ears appear to be doing well I see no signs of issue other than having a little bit of fluid behind the eardrum on the right side. Assessment Active Problems ICD-10 Other osteonecrosis, other site Inflammatory conditions of jaws Personal history of  irradiation Malignant neoplasm of mandible Malignant neoplasm of floor of mouth, unspecified Plan Follow-up Appointments: Return Appointment in: - Dr. Celine Ahr - room 2 - 9/4 at 12:45 PM Hyperbaric Oxygen Therapy: Evaluate for HBO Therapy Indication: - Osteoradionecrosis of jaw If appropriate for treatment, begin HBOT per protocol: 2.5 ATA for 90 Minutes with 2 Five (5) Minute Air Breaks T Number of Treatments: - 40 more treatments 01/02/22 otal One treatments per day (delivered Monday through Friday unless otherwise specified in Special Instructions below): Afrin (Oxymetazoline HCL) 0.05% nasal spray - 1 spray in both nostrils daily as needed prior to HBO treatment for difficulty clearing ears 1. I am going to recommend that we continue with the hyperbaric oxygen therapy I think that is still appropriate based on what we are seeing. Her most recent CT scan mid July also redemonstrated the osteoradionecrosis. 2. She is also been referred to plastic surgery to see if there is anything that can be done in that regard to speed her healing. I think that is still absolutely appropriate and we are going to continue down that path as far as looking into the possibility of a plastic surgery aid with getting this closed faster. Currently we are going to continue with hyperbaric oxygen therapy as I feel like that is still to benefit her medically currently we are going to continue with hyperbaric oxygen therapy as I feel like that is still to benefit her medically and helping to try and improve blood flow to the area and especially if she undergoes any type of facial surgery with plastic surgery this is going to be I think crucial to allow this the best chance to heal appropriately. Electronic Signature(s) Signed: 01/30/2022 1:05:42 PM By: Worthy Keeler PA-C Entered By: Worthy Keeler on 01/30/2022 13:05:42 -------------------------------------------------------------------------------- SuperBill  Details Patient Name: Date of Service: [name] 01/30/2022 Medical Record Number: [id number] Patient Account Number: [id number] Date of Birth/Sex: Treating RN: May 18, 1961 (  61 y.o. 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

## 2022-01-30 NOTE — Progress Notes (Signed)
Theresa, Dillon ([id number]) Visit Report for 01/30/2022 Arrival Information Details Patient Name: Date of Service: [name] 01/30/2022 1:30 PM Medical Record Number: [id number] Patient Account Number: [id number] Date of Birth/Sex: Treating RN: 20-Sep-1960 (61 y.o. [name] Primary Care Edwinna Rochette: [name] Other Clinician: Donavan Burnet Referring Jlyn Cerros: Treating Kirin Brandenburger/Extender: Mauricia Area in Treatment: 12 Visit Information History Since Last Visit All ordered tests and consults were completed: Yes Patient Arrived: Ambulatory Added or deleted any medications: No Arrival Time: 12:12 Any new allergies or adverse reactions: No Accompanied By: self Had a fall or experienced change in No Transfer Assistance: None activities of daily living that may affect Patient Identification Verified: Yes risk of falls: Secondary Verification Process Completed: Yes Signs or symptoms of abuse/neglect since last visito No Patient Requires Transmission-Based Precautions: No Hospitalized since last visit: No Patient Has Alerts: No Implantable device outside of the clinic excluding No cellular tissue based products placed in the center since last visit: Pain Present Now: No Electronic Signature(s) Signed: 01/30/2022 4:16:55 PM By: Donavan Burnet CHT EMT BS , , Entered By: Donavan Burnet on 01/30/2022 16:16:55 -------------------------------------------------------------------------------- Encounter Discharge Information Details Patient Name: Date of Service: [name] Gearldine Dillon D. 01/30/2022 1:30 PM Medical Record Number: [id number] Patient Account Number: [id number] Date of Birth/Sex: Treating RN: [birth year] (61 y.o. [name] Primary Care Juli Odom: [name] Other Clinician: Donavan Burnet Referring Delayza Lungren: Treating Gardiner Espana/Extender: Mauricia Area in Treatment:  12 Encounter Discharge Information Items Discharge Condition: Stable Ambulatory Status: Ambulatory Discharge Destination: Home Transportation: Private Auto Accompanied By: self Schedule Follow-up Appointment: No Clinical Summary of Care: Electronic Signature(s) Signed: 01/30/2022 4:26:14 PM By: Donavan Burnet CHT EMT BS , , Entered By: Donavan Burnet on 01/30/2022 16:26:13 -------------------------------------------------------------------------------- Vitals Details Patient Name: Date of Service: [name] 01/30/2022 1:30 PM Medical Record Number: [id number] Patient Account Number: [id number] Date of Birth/Sex: Treating RN: 10-18-1960 (61 y.o. [name] Primary Care [name]: [name] Other Clinician: Donavan Burnet Referring Alisen Marsiglia: Treating Ariannie Penaloza/Extender: Denton Brick Weeks in Treatment: 12 Vital Signs Time Taken: 12:49 Temperature (F): 97.9 Height (in): 60 Pulse (bpm): 63 Weight (lbs): 88 Respiratory Rate (breaths/min): 16 Body Mass Index (BMI): 17.2 Blood Pressure (mmHg): 133/84 Reference Range: 80 - 120 mg / dl Notes Vitals taken during wound care encounter. Electronic Signature(s) Signed: 01/30/2022 4:17:59 PM By: Donavan Burnet CHT EMT BS , , Entered By: Donavan Burnet on 01/30/2022 16:17:59

## 2022-01-30 NOTE — Progress Notes (Addendum)
[name], [name] ([id number]) Visit Report for 01/30/2022 HBO Details Patient Name: Date of Service: Theresa LLA [name] D. 01/30/2022 1:30 PM Medical Record Number: [id number] Patient Account Number: [id number] Date of Birth/Sex: Treating RN: [birth year] (61 y.o. 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, Jaw HBO Treatment Details Treatment Number: 53 Patient Type: Outpatient Chamber Type: Monoplace Chamber Serial #: [id number] Treatment Protocol: 2.5 ATA with 90 minutes oxygen, with two 5 minute air breaks Treatment Details Compression Rate Down: 1.0 psi / minute De-Compression Rate Up: 1.5 psi / minute A breaks and breathing ir Compress Tx Pressure periods Decompress Decompress Begins Reached (leave unused spaces Begins Ends blank) Chamber Pressure (ATA 1 2.5 2.5 2.5 2.5 2.5 - - 2.5 1 ) Clock Time (24 hr) 13:25 13:47 14:17 14:22 14:52 14:57 - - 15:27 15:42 Treatment Length: 137 (minutes) Treatment Segments: 5 Vital Signs Capillary Blood Glucose Reference Range: 80 - 120 mg / dl HBO Diabetic Blood Glucose Intervention Range: <131 mg/dl or >249 mg/dl Type: Time Vitals Blood Respiratory Capillary Blood Glucose Pulse Action Pulse: Temperature: Taken: Pressure: Rate: Glucose (mg/dl): Meter #: Oximetry (%) Taken: Pre 12:49 133/84 63 16 97.9 none per protocol Post 15:44 130/82 63 16 97.8 none per protocol Treatment Response Treatment Toleration: Well Treatment Completion Status: Treatment Completed without Adverse Event Electronic Signature(s) Signed: 01/30/2022 4:24:42 PM By: Donavan Burnet CHT EMT BS ,  , Signed: 01/30/2022 5:04:53 PM By: Worthy Keeler PA-C Entered By: Donavan Burnet on 01/30/2022 16:24:41 -------------------------------------------------------------------------------- HBO Safety Checklist Details Patient Name: Date of Service: Theresa LLA [name] D. 01/30/2022 1:30 PM Medical Record Number: [id number] Patient Account Number: [id number] Date of Birth/Sex: Treating RN: 1961-05-27 (61 y.o. [name] Primary Care Jannat Rosemeyer: [name] Other Clinician: Donavan Burnet Referring Carroll Lingelbach: Treating Shaiann Mcmanamon/Extender: Denton Brick Weeks in Treatment: 12 HBO Safety Checklist Items Safety Checklist Consent Form Signed Patient voided / foley secured and emptied When did you last eato 0930 Last dose of injectable or oral agent n/a Ostomy pouch emptied and vented if applicable NA All implantable devices assessed, documented and approved NA Intravenous access site secured and place NA Valuables secured Linens and cotton and cotton/polyester blend (less than 51% polyester) Personal oil-based products / skin lotions / body lotions removed Wigs or hairpieces removed NA Smoking or tobacco materials removed NA Books / newspapers / magazines / loose paper removed Cologne, aftershave, perfume and deodorant removed Jewelry removed (may wrap wedding band) Make-up removed Hair care products removed Battery operated devices (external) removed Heating patches and chemical warmers removed Titanium eyewear removed NA Nail polish cured greater than 10 hours Casting material cured greater than 10 hours Hearing aids removed Loose dentures or partials removed NA Prosthetics have been removed NA Patient demonstrates correct use of air break device (if applicable) Patient concerns have been addressed Patient grounding bracelet on and cord attached to chamber Specifics for Inpatients (complete in addition to above) Medication sheet sent  with patient NA Intravenous medications needed or due during therapy sent with patient NA Drainage tubes (e.g. nasogastric tube or chest tube secured and vented) NA Endotracheal or Tracheotomy tube secured NA Cuff deflated of air and inflated with saline NA Airway suctioned  NA Notes Paper version used prior to treatment start. Electronic Signature(s) Signed: 01/30/2022 4:19:22 PM By: Donavan Burnet CHT EMT BS , , Entered By: Donavan Burnet on 01/30/2022 16:19:22

## 2022-01-30 NOTE — Progress Notes (Signed)
JAQUAY, MORNEAULT ([id number]) Visit Report for 01/30/2022 Arrival Information Details Patient Name: Date of Service: [name] 01/30/2022 12:45 PM Medical Record Number: [id number] Patient Account Number: [id number] Date of Birth/Sex: Treating RN: [birth year] (61 y.o. [name] Primary Care Garner Dullea: [name] Other Clinician: Referring Krystal Teachey: Treating Martavius Lusty/Extender: Mauricia Area in Treatment: 12 Visit Information History Since Last Visit Added or deleted any medications: No Patient Arrived: Ambulatory Any new allergies or adverse reactions: No Arrival Time: 12:48 Had a fall or experienced change in No Accompanied By: self activities of daily living that may affect Transfer Assistance: None risk of falls: Patient Identification Verified: Yes Signs or symptoms of abuse/neglect since last visito No Secondary Verification Process Completed: Yes Hospitalized since last visit: No Patient Requires Transmission-Based Precautions: No Implantable device outside of the clinic excluding No Patient Has Alerts: No cellular tissue based products placed in the center since last visit: Pain Present Now: No Electronic Signature(s) Signed: 01/30/2022 5:11:01 PM By: Adline Peals Entered By: Adline Peals on 01/30/2022 12:49:07 -------------------------------------------------------------------------------- Clinic Level of Care Assessment Details Patient Name: Date of Service: [name] 01/30/2022 12:45 PM Medical Record Number: [id number] Patient Account Number: [id number] Date of Birth/Sex: Treating RN: 01-25-1961 (61 y.o. [name] Primary Care Averyana Pillars: [name] Other Clinician: Referring Lacinda Curvin: Treating Eloise Picone/Extender: Mauricia Area in Treatment: 12 Clinic Level of Care Assessment Items TOOL 4 Quantity Score X- 1 0 Use when only an EandM is performed  on FOLLOW-UP visit ASSESSMENTS - Nursing Assessment / Reassessment X- 1 10 Reassessment of Co-morbidities (includes updates in patient status) X- 1 5 Reassessment of Adherence to Treatment Plan ASSESSMENTS - Wound and Skin A ssessment / Reassessment '[]'$  - 0 Simple Wound Assessment / Reassessment - one wound '[]'$  - 0 Complex Wound Assessment / Reassessment - multiple wounds '[]'$  - 0 Dermatologic / Skin Assessment (not related to wound area) ASSESSMENTS - Focused Assessment '[]'$  - 0 Circumferential Edema Measurements - multi extremities '[]'$  - 0 Nutritional Assessment / Counseling / Intervention '[]'$  - 0 Lower Extremity Assessment (monofilament, tuning fork, pulses) '[]'$  - 0 Peripheral Arterial Disease Assessment (using hand held doppler) ASSESSMENTS - Ostomy and/or Continence Assessment and Care '[]'$  - 0 Incontinence Assessment and Management '[]'$  - 0 Ostomy Care Assessment and Management (repouching, etc.) PROCESS - Coordination of Care X - Simple Patient / Family Education for ongoing care 1 15 '[]'$  - 0 Complex (extensive) Patient / Family Education for ongoing care X- 1 10 Staff obtains Consents, Records, T Results / Process Orders est '[]'$  - 0 Staff telephones HHA, Nursing Homes / Clarify orders / etc '[]'$  - 0 Routine Transfer to another Facility (non-emergent condition) '[]'$  - 0 Routine Hospital Admission (non-emergent condition) '[]'$  - 0 New Admissions / Biomedical engineer / Ordering NPWT Apligraf, etc. , '[]'$  - 0 Emergency Hospital Admission (emergent condition) X- 1 10 Simple Discharge Coordination '[]'$  - 0 Complex (extensive) Discharge Coordination PROCESS - Special Needs '[]'$  - 0 Pediatric / Minor Patient Management '[]'$  - 0 Isolation Patient Management '[]'$  - 0 Hearing / Language / Visual special needs '[]'$  - 0 Assessment of Community assistance (transportation, D/C planning, etc.) '[]'$  - 0 Additional assistance / Altered mentation '[]'$  - 0 Support Surface(s) Assessment (bed,  cushion, seat, etc.) INTERVENTIONS - Wound Cleansing / Measurement '[]'$  - 0 Simple Wound Cleansing - one wound '[]'$  - 0 Complex Wound Cleansing - multiple wounds '[]'$  - 0  Wound Imaging (photographs - any number of wounds) '[]'$  - 0 Wound Tracing (instead of photographs) '[]'$  - 0 Simple Wound Measurement - one wound '[]'$  - 0 Complex Wound Measurement - multiple wounds INTERVENTIONS - Wound Dressings '[]'$  - 0 Small Wound Dressing one or multiple wounds '[]'$  - 0 Medium Wound Dressing one or multiple wounds '[]'$  - 0 Large Wound Dressing one or multiple wounds '[]'$  - 0 Application of Medications - topical '[]'$  - 0 Application of Medications - injection INTERVENTIONS - Miscellaneous '[]'$  - 0 External ear exam '[]'$  - 0 Specimen Collection (cultures, biopsies, blood, body fluids, etc.) '[]'$  - 0 Specimen(s) / Culture(s) sent or taken to Lab for analysis '[]'$  - 0 Patient Transfer (multiple staff / Civil Service fast streamer / Similar devices) '[]'$  - 0 Simple Staple / Suture removal (25 or less) '[]'$  - 0 Complex Staple / Suture removal (26 or more) '[]'$  - 0 Hypo / Hyperglycemic Management (close monitor of Blood Glucose) '[]'$  - 0 Ankle / Brachial Index (ABI) - do not check if billed separately X- 1 5 Vital Signs Has the patient been seen at the hospital within the last three years: Yes Total Score: 55 Level Of Care: New/Established - Level 2 Electronic Signature(s) Signed: 01/30/2022 5:11:01 PM By: Adline Peals Entered By: Adline Peals on 01/30/2022 12:55:24 -------------------------------------------------------------------------------- Encounter Discharge Information Details Patient Name: Date of Service: [name] 01/30/2022 12:45 PM Medical Record Number: [id number] Patient Account Number: [id number] Date of Birth/Sex: Treating RN: Nov 12, 1960 (62 y.o. [name] Primary Care Natalyn Szymanowski: [name] Other Clinician: Referring Adaiah Jaskot: Treating Lavinia Mcneely/Extender: Mauricia Area in Treatment: 12 Encounter Discharge Information Items Discharge Condition: Stable Ambulatory Status: Ambulatory Discharge Destination: Home Transportation: Private Auto Accompanied By: self Schedule Follow-up Appointment: Yes Clinical Summary of Care: Patient Declined Electronic Signature(s) Signed: 01/30/2022 5:11:01 PM By: Adline Peals Entered By: Adline Peals on 01/30/2022 12:55:52 -------------------------------------------------------------------------------- Lower Extremity Assessment Details Patient Name: Date of Service: [name] 01/30/2022 12:45 PM Medical Record Number: [id number] Patient Account Number: [id number] Date of Birth/Sex: Treating RN: [birth year] (61 y.o. [name] Primary Care Sarahlynn Cisnero: [name] Other Clinician: Referring Claris Pech: Treating Emmalie Haigh/Extender: Earlean Shawl, Marianthe Weeks in Treatment: 12 Electronic Signature(s) Signed: 01/30/2022 5:11:01 PM By: Sabas Sous By: Adline Peals on 01/30/2022 12:50:19 -------------------------------------------------------------------------------- [place] Details Patient Name: Date of Service: [name] 01/30/2022 12:45 PM Medical Record Number: [id number] Patient Account Number: [id number] Date of Birth/Sex: Treating RN: Mar 25, 1961 (61 y.o. 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 to knowledge deficit of hyperbaric oxygen therapy and treatment procedures Discomfort related to temperature and humidity changes inside hyperbaric chamber Potential for  barotraumas to ears, sinuses, teeth, and lungs or cerebral gas embolism related to changes in atmospheric pressure inside hyperbaric oxygen chamber Potential for oxygen toxicity seizures related to delivery of 100% oxygen at an increased atmospheric pressure Potential for pulmonary oxygen toxicity related to delivery of 100% oxygen at an increased atmospheric pressure Goals: Barotrauma will be prevented during HBO2 Date Initiated: 11/05/2021 Target Resolution Date: 03/01/2022 Goal Status: Active Patient and/or family will be able to state/discuss factors appropriate to the management of their disease process during treatment  Date Initiated: 11/05/2021 Target Resolution Date: 03/01/2022 Goal Status: Active Patient will tolerate the hyperbaric oxygen therapy treatment Date Initiated: 11/05/2021 Target Resolution Date: 03/01/2022 Goal Status: Active Patient will tolerate the internal climate of the chamber Date Initiated: 11/05/2021 Target Resolution Date: 03/01/2022 Goal Status: Active Patient/caregiver will verbalize understanding of HBO goals, rationale, procedures and potential hazards Date Initiated: 11/05/2021 Target Resolution Date: 03/01/2022 Goal Status: Active Signs and symptoms of pulmonary oxygen toxicity will be recognized and promptly addressed Date Initiated: 11/05/2021 Target Resolution Date: 03/01/2022 Goal Status: Active Signs and symptoms of seizure will be recognized and promptly addressed ; seizing patients will suffer no harm Date Initiated: 11/05/2021 Target Resolution Date: 03/02/2022 Goal Status: Active Interventions: Administer a five (5) minute air break for patient if signs and symptoms of seizure appear and notify the hyperbaric physician Administer the correct therapeutic gas delivery based on the patients needs and limitations, per physician order Assess and provide for patients comfort related to the hyperbaric environment and equalization of middle ear Assess for signs and  symptoms related to adverse events, including but not limited to confinement anxiety, pneumothorax, oxygen toxicity and baurotrauma Assess patient for any history of confinement anxiety Assess patient's knowledge and expectations regarding hyperbaric medicine and provide education related to the hyperbaric environment, goals of treatment and prevention of adverse events Implement protocols to decrease risk of pneumothorax in high risk patients Notes: Electronic Signature(s) Signed: 01/30/2022 5:11:01 PM By: Adline Peals Entered By: Adline Peals on 01/30/2022 12:54:38 -------------------------------------------------------------------------------- Pain Assessment Details Patient Name: Date of Service: [name] 01/30/2022 12:45 PM Medical Record Number: [id number] Patient Account Number: [id number] Date of Birth/Sex: Treating RN: December 04, 1960 (61 y.o. [name] Primary Care Shahrzad Koble: [name] Other Clinician: Referring Eris Hannan: Treating Amilyah Nack/Extender: Denton Brick Weeks in Treatment: 12 Active Problems Location of Pain Severity and Description of Pain Patient Has Paino No Site Locations Rate the pain. Current Pain Level: 0 Pain Management and Medication Current Pain Management: Electronic Signature(s) Signed: 01/30/2022 5:11:01 PM By: Adline Peals Entered By: Adline Peals on 01/30/2022 12:50:16 -------------------------------------------------------------------------------- Patient/Caregiver Education Details Patient Name: Date of Service: [name] [name] 8/2/2023andnbsp12:45 PM Medical Record Number: [id number] Patient Account Number: [id number] Date of Birth/Gender: Treating RN: February 11, 1961 (61 y.o. [name] Primary Care Physician: [name] Other Clinician: Referring Physician: Treating Physician/Extender: Mauricia Area in Treatment:  12 Education Assessment Education Provided To: Patient Education Topics Provided Safety: Methods: Explain/Verbal Responses: Reinforcements needed, State content correctly Electronic Signature(s) Signed: 01/30/2022 5:11:01 PM By: Adline Peals Entered By: Adline Peals on 01/30/2022 12:55:01 -------------------------------------------------------------------------------- Vitals Details Patient Name: Date of Service: [name] 01/30/2022 12:45 PM Medical Record Number: [id number] Patient Account Number: [id number] Date of Birth/Sex: Treating RN: October 13, 1960 (61 y.o. [name] Primary Care Kaoru Rezendes: [name] Other Clinician: Referring Kevork Joyce: Treating Edin Skarda/Extender: Denton Brick Weeks in Treatment: 12 Vital Signs Time Taken: 12:49 Temperature (F): 97.9 Height (in): 60 Pulse (bpm): 63 Weight (lbs): 88 Respiratory Rate (breaths/min): 16 Body Mass Index (BMI): 17.2 Blood Pressure (mmHg): 133/84 Reference Range: 80 - 120 mg / dl Electronic Signature(s) Signed: 01/30/2022 5:11:01 PM By: Adline Peals Entered By: Adline Peals on 01/30/2022 12:50:10

## 2022-01-30 NOTE — Progress Notes (Signed)
[name], [name] ([id number]) Visit Report for 01/30/2022 SuperBill Details Patient Name: Date of Service: [name] 01/30/2022 Medical Record Number: [id number] Patient Account Number: [id number] Date of Birth/Sex: Treating RN: 06/20/1961 (61 y.o. 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

## 2022-01-31 ENCOUNTER — Encounter (HOSPITAL_BASED_OUTPATIENT_CLINIC_OR_DEPARTMENT_OTHER): Payer: Medicare Other | Admitting: Internal Medicine

## 2022-01-31 DIAGNOSIS — M8788 Other osteonecrosis, other site: Secondary | ICD-10-CM | POA: Diagnosis not present

## 2022-01-31 DIAGNOSIS — M272 Inflammatory conditions of jaws: Secondary | ICD-10-CM | POA: Diagnosis not present

## 2022-01-31 DIAGNOSIS — C411 Malignant neoplasm of mandible: Secondary | ICD-10-CM | POA: Diagnosis not present

## 2022-01-31 DIAGNOSIS — Z923 Personal history of irradiation: Secondary | ICD-10-CM

## 2022-01-31 NOTE — Progress Notes (Addendum)
Theresa, Dillon ([id number]) Visit Report for 01/31/2022 HBO Details Patient Name: Date of Service: [name]. 01/31/2022 1:00 PM Medical Record Number: [id number] Patient Account Number: [id number] Date of Birth/Sex: Treating RN: [birth year] (61 y.o. 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, Jaw HBO Treatment Details Treatment Number: 54 Patient Type: Outpatient Chamber Type: Monoplace Chamber Serial #: [id number] Treatment Protocol: 2.5 ATA with 90 minutes oxygen, with two 5 minute air breaks Treatment Details Compression Rate Down: 2.0 psi / minute De-Compression Rate Up: A breaks and breathing ir Compress Tx Pressure periods Decompress Decompress Begins Reached (leave unused spaces Begins Ends blank) Chamber Pressure (ATA 1 2.5 2.5 2.5 2.5 2.5 - - 2.5 1 ) Clock Time (24 hr) 12:11 12:25 12:55 13:00 13:30 13:35 - - 14:05 14:16 Treatment Length: 125 (minutes) Treatment Segments: 4 Vital Signs Capillary Blood Glucose Reference Range: 80 - 120 mg / dl HBO Diabetic Blood Glucose Intervention Range: <131 mg/dl or >249 mg/dl Time Vitals Blood Respiratory Capillary Blood Glucose Pulse Action Type: Pulse: Temperature: Taken: Pressure: Rate: Glucose (mg/dl): Meter #: Oximetry (%) Taken: Pre 12:04 129/69 72 16 98 Post 14:18 144/63 67 14 97.9 Treatment Response Treatment Toleration: Well Treatment Completion Status: Treatment Completed without Adverse Event Physician HBO Attestation: I certify that I supervised this HBO treatment in accordance with Medicare guidelines. A trained emergency response team is  readily available per Yes hospital policies and procedures. Continue HBOT as ordered. Yes Electronic Signature(s) Signed: 01/31/2022 3:55:06 PM By: [name] DO Previous Signature: 01/31/2022 2:29:18 PM Version By: [name] EMT Previous Signature: 01/31/2022 1:53:08 PM Version By: [name] EMT Entered By: [name] on 01/31/2022 15:54:14 -------------------------------------------------------------------------------- HBO Safety Checklist Details Patient Name: Date of Service: [name]. 01/31/2022 1:00 PM Medical Record Number: [id number] Patient Account Number: [id number] Date of Birth/Sex: Treating RN: December 28, 1960 (61 y.o. [name] Primary Care Daleiza Bacchi: [name] Other Clinician: [name] Referring Jalei Shibley: Treating Bradd Merlos/Extender: [name] Weeks in Treatment: 12 HBO Safety Checklist Items Safety Checklist Consent Form Signed Patient voided / foley secured and emptied When did you last eato 1000 Last dose of injectable or oral agent NA Ostomy pouch emptied and vented if applicable NA All implantable devices assessed, documented and approved NA Intravenous access site secured and place NA Valuables secured Linens and cotton and cotton/polyester blend (less than 51% polyester) Personal oil-based products / skin lotions / body lotions removed Wigs or hairpieces removed NA Smoking or tobacco materials removed Books / newspapers / magazines / loose paper removed Cologne, aftershave, perfume and deodorant removed Jewelry removed (may wrap wedding band) NA Make-up removed Hair care products removed Battery operated devices (external) removed Heating patches and chemical warmers removed Titanium eyewear removed NA Nail polish cured greater than 10 hours Casting material cured greater than 10 hours NA Hearing aids removed NA Loose dentures or partials removed NA Prosthetics have been  removed NA Patient demonstrates correct use of air break device (if applicable) Patient concerns have been addressed Patient grounding bracelet on and cord attached to chamber Specifics for Inpatients (complete in addition to above) Medication sheet sent with patient NA Intravenous medications needed or due during therapy sent  with patient NA Drainage tubes (e.g. nasogastric tube or chest tube secured and vented) NA Endotracheal or Tracheotomy tube secured NA Cuff deflated of air and inflated with saline NA Airway suctioned NA Notes The safety checklist was done before the treatment was started Electronic Signature(s) Signed: 01/31/2022 1:56:48 PM By: [name] EMT Previous Signature: 01/31/2022 1:51:35 PM Version By: [name] EMT Entered By: [name] on 01/31/2022 13:56:48

## 2022-01-31 NOTE — Progress Notes (Signed)
DEISSY, GUILBERT ([id number]) Visit Report for 01/31/2022 Problem List Details Patient Name: Date of Service: Theresa LLA Theresa Alexander D. 01/31/2022 1:00 PM Medical Record Number: [id number] Patient Account Number: [id number] Date of Birth/Sex: Treating RN: [birth year] (61 y.o. 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 01/31/2022 Medical Record Number: [id number] Patient Account Number: [id number] Date of Birth/Sex: Treating RN: 12/01/1960 (61 y.o. 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

## 2022-01-31 NOTE — Progress Notes (Addendum)
GLORIANA, PILTZ ([id number]) Visit Report for 01/31/2022 Arrival Information Details Patient Name: Date of Service: [name] 01/31/2022 1:00 PM Medical Record Number: [id number] Patient Account Number: [id number] Date of Birth/Sex: Treating RN: [birth year] (61 y.o. [name] Primary Care Jsean Taussig: [name] Other Clinician: [name] Referring Sande Pickert: Treating Dalasia Predmore/Extender: [name] in Treatment: 12 Visit Information History Since Last Visit All ordered tests and consults were completed: Yes Patient Arrived: Ambulatory Added or deleted any medications: No Arrival Time: 11:56 Any new allergies or adverse reactions: No Accompanied By: None Had a fall or experienced change in No Transfer Assistance: None activities of daily living that may affect Patient Identification Verified: Yes risk of falls: Secondary Verification Process Completed: Yes Signs or symptoms of abuse/neglect since last visito No Patient Requires Transmission-Based Precautions: No Hospitalized since last visit: No Patient Has Alerts: No Implantable device outside of the clinic excluding No cellular tissue based products placed in the center since last visit: Pain Present Now: No Electronic Signature(s) Signed: 01/31/2022 1:49:17 PM By: [name] EMT Entered By: [name] on 01/31/2022 13:49:17 -------------------------------------------------------------------------------- Encounter Discharge Information Details Patient Name: Date of Service: [name] 01/31/2022 1:00 PM Medical Record Number: [id number] Patient Account Number: [id number] Date of Birth/Sex: Treating RN: 05-27-1961 (61 y.o. [name] Primary Care Aristidis Talerico: [name] Other Clinician: [name] Referring Lujuana Kapler: Treating Essie Lagunes/Extender: [name] in Treatment: 12 Encounter Discharge Information  Items Discharge Condition: Stable Ambulatory Status: Ambulatory Discharge Destination: Home Transportation: Private Auto Accompanied By: None Schedule Follow-up Appointment: Yes Clinical Summary of Care: Electronic Signature(s) Signed: 01/31/2022 2:30:22 PM By: [name] EMT Entered By: [name] on 01/31/2022 14:30:21 -------------------------------------------------------------------------------- Vitals Details Patient Name: Date of Service: [name] 01/31/2022 1:00 PM Medical Record Number: [id number] Patient Account Number: [id number] Date of Birth/Sex: Treating RN: [birth year] (61 y.o. [name] Primary Care Tylesha Gibeault: [name] Other Clinician: [name] Referring Ostin Mathey: Treating Crystal Scarberry/Extender: [name] Weeks in Treatment: 12 Vital Signs Time Taken: 12:04 Temperature (F): 98.0 Height (in): 60 Pulse (bpm): 72 Weight (lbs): 88 Respiratory Rate (breaths/min): 16 Body Mass Index (BMI): 17.2 Blood Pressure (mmHg): 129/69 Reference Range: 80 - 120 mg / dl Electronic Signature(s) Signed: 01/31/2022 1:49:41 PM By: [name] EMT Entered By: [name] on 01/31/2022 13:49:40

## 2022-02-01 ENCOUNTER — Encounter (HOSPITAL_BASED_OUTPATIENT_CLINIC_OR_DEPARTMENT_OTHER): Payer: Medicare Other | Admitting: Internal Medicine

## 2022-02-01 DIAGNOSIS — C411 Malignant neoplasm of mandible: Secondary | ICD-10-CM | POA: Diagnosis not present

## 2022-02-01 DIAGNOSIS — M272 Inflammatory conditions of jaws: Secondary | ICD-10-CM

## 2022-02-01 DIAGNOSIS — Z923 Personal history of irradiation: Secondary | ICD-10-CM

## 2022-02-01 DIAGNOSIS — M8788 Other osteonecrosis, other site: Secondary | ICD-10-CM

## 2022-02-01 NOTE — Progress Notes (Signed)
PINKY, RAVAN ([id number]) Visit Report for 02/01/2022 SuperBill Details Patient Name: Date of Service: [name] 02/01/2022 Medical Record Number: [id number] Patient Account Number: [id number] Date of Birth/Sex: Treating RN: 04/23/1961 (61 y.o. 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

## 2022-02-01 NOTE — Progress Notes (Addendum)
[name], MCMEEKIN ([id number]) Visit Report for 02/01/2022 Arrival Information Details Patient Name: Date of Service: Theresa LLA Theresa Alexander D. 02/01/2022 10:00 A M Medical Record Number: [id number] Patient Account Number: [id number] Date of Birth/Sex: Treating RN: [birth year] (61 y.o. [name], [name] Primary Care Zian Mohamed: [name] Other Clinician: Donavan Burnet Referring Shenekia Riess: Treating Glenette Bookwalter/Extender: [name] in Treatment: 12 Visit Information History Since Last Visit All ordered tests and consults were completed: Yes Patient Arrived: Ambulatory Added or deleted any medications: No Arrival Time: 09:36 Any new allergies or adverse reactions: No Accompanied By: self Had a fall or experienced change in No Transfer Assistance: None activities of daily living that may affect Patient Identification Verified: Yes risk of falls: Secondary Verification Process Completed: Yes Signs or symptoms of abuse/neglect since last visito No Patient Requires Transmission-Based Precautions: No Hospitalized since last visit: No Patient Has Alerts: No Implantable device outside of the clinic excluding No cellular tissue based products placed in the center since last visit: Pain Present Now: No Electronic Signature(s) Signed: 02/01/2022 10:54:59 AM By: Donavan Burnet CHT EMT BS , , Entered By: Donavan Burnet on 02/01/2022 10:54:59 -------------------------------------------------------------------------------- Encounter Discharge Information Details Patient Name: Date of Service: Theresa LLA HA Theresa Bienenstock D. 02/01/2022 10:00 A M Medical Record Number: [id number] Patient Account Number: [id number] Date of Birth/Sex: Treating RN: 03/31/1961 (61 y.o. [name] Primary Care Dayan Kreis: [name] Other Clinician: [name] Referring Sherlene Rickel: Treating Dameisha Tschida/Extender: [name] in Treatment: 12 Encounter  Discharge Information Items Discharge Condition: Stable Ambulatory Status: Ambulatory Discharge Destination: Home Transportation: Private Auto Accompanied By: self Schedule Follow-up Appointment: No Clinical Summary of Care: Electronic Signature(s) Signed: 02/01/2022 12:50:45 PM By: Donavan Burnet CHT EMT BS , , Entered By: Donavan Burnet on 02/01/2022 12:50:45 -------------------------------------------------------------------------------- Vitals Details Patient Name: Date of Service: Theresa LLA Theresa Alexander D. 02/01/2022 10:00 A M Medical Record Number: [id number] Patient Account Number: [id number] Date of Birth/Sex: Treating RN: [birth year] (61 y.o. [name], [name] Primary Care [name]: [name] Other Clinician: [name] Referring Nonnie Pickney: Treating Kandie Keiper/Extender: [name] Weeks in Treatment: 12 Vital Signs Time Taken: 09:48 Temperature (F): 97.4 Height (in): 60 Pulse (bpm): 79 Weight (lbs): 88 Respiratory Rate (breaths/min): 16 Body Mass Index (BMI): 17.2 Blood Pressure (mmHg): 98/69 Reference Range: 80 - 120 mg / dl Electronic Signature(s) Signed: 02/01/2022 10:55:27 AM By: Donavan Burnet CHT EMT BS , , Entered By: Donavan Burnet on 02/01/2022 10:55:27

## 2022-02-01 NOTE — Progress Notes (Addendum)
LASHAYE, FISK ([id number]) Visit Report for 02/01/2022 HBO Details Patient Name: Date of Service: [name] 02/01/2022 10:00 A M Medical Record Number: [id number] Patient Account Number: [id number] Date of Birth/Sex: Treating RN: [birth year] (61 y.o. 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, Jaw HBO Treatment Details Treatment Number: 55 Patient Type: Outpatient Chamber Type: Monoplace Chamber Serial #: [id number] Treatment Protocol: 2.5 ATA with 90 minutes oxygen, with two 5 minute air breaks Treatment Details Compression Rate Down: 2.0 psi / minute De-Compression Rate Up: 2.0 psi / minute A breaks and breathing ir Compress Tx Pressure periods Decompress Decompress Begins Reached (leave unused spaces Begins Ends blank) Chamber Pressure (ATA 1 2.5 2.5 2.5 2.5 2.5 - - 2.5 1 ) Clock Time (24 hr) 10:15 10:27 10:57 11:02 11:32 11:40 - - 12:10 12:21 Treatment Length: 126 (minutes) Treatment Segments: 4 Vital Signs Capillary Blood Glucose Reference Range: 80 - 120 mg / dl HBO Diabetic Blood Glucose Intervention Range: <131 mg/dl or >249 mg/dl Type: Time Vitals Blood Pulse: Respiratory Temperature: Capillary Blood Glucose Pulse Action Taken: Pressure: Rate: Glucose (mg/dl): Meter #: Oximetry (%) Taken: Pre 09:48 98/69 79 16 29.4 systolic BP below 765 mmHg, asymptomatic Post 12:25 150/93 70 16 97.4 none per protocol Treatment Response Treatment Toleration: Well Treatment Completion Status: Treatment Completed without Adverse Event Physician HBO Attestation: I certify that I supervised this HBO treatment in  accordance with Medicare guidelines. A trained emergency response team is readily available per Yes hospital policies and procedures. Continue HBOT as ordered. Yes Electronic Signature(s) Signed: 02/01/2022 1:25:18 PM By: [name] DO Previous Signature: 02/01/2022 12:49:45 PM Version By: Donavan Burnet CHT EMT BS , , Entered By: [name] on 02/01/2022 13:24:38 -------------------------------------------------------------------------------- HBO Safety Checklist Details Patient Name: Date of Service: [name] 02/01/2022 10:00 A M Medical Record Number: [id number] Patient Account Number: [id number] Date of Birth/Sex: Treating RN: Dec 04, 1960 (61 y.o. [name], [name] Primary Care Trai Ells: [name] Other Clinician: [name] Referring Chasya Zenz: Treating Juanisha Bautch/Extender: [name] Weeks in Treatment: 12 HBO Safety Checklist Items Safety Checklist Consent Form Signed Patient voided / foley secured and emptied When did you last eato 0830 Last dose of injectable or oral agent n/a Ostomy pouch emptied and vented if applicable NA All implantable devices assessed, documented and approved NA Intravenous access site secured and place NA Valuables secured Linens and cotton and cotton/polyester blend (less than 51% polyester) Personal oil-based products / skin lotions / body lotions removed Wigs or hairpieces removed NA Smoking or tobacco materials removed NA Books / newspapers / magazines / loose paper removed Cologne, aftershave, perfume and deodorant removed Jewelry removed (may wrap wedding band) Make-up removed Hair care products removed Battery operated devices (external) removed Heating patches and chemical warmers removed Titanium eyewear removed NA Nail polish cured greater than 10 hours Casting material cured greater than 10 hours NA Hearing aids removed NA Loose dentures or partials  removed NA Prosthetics have been removed NA Patient demonstrates correct use of air break device (if applicable) Patient concerns have been addressed Patient grounding bracelet on and cord attached to chamber Specifics for Inpatients (complete in addition to above) Medication sheet sent with patient  NA Intravenous medications needed or due during therapy sent with patient NA Drainage tubes (e.g. nasogastric tube or chest tube secured and vented) NA Endotracheal or Tracheotomy tube secured NA Cuff deflated of air and inflated with saline NA Airway suctioned NA Notes Paper version used prior to treatment. Electronic Signature(s) Signed: 02/01/2022 10:58:31 AM By: Donavan Burnet CHT EMT BS , , Entered By: Donavan Burnet on 02/01/2022 10:58:31

## 2022-02-04 ENCOUNTER — Encounter (HOSPITAL_BASED_OUTPATIENT_CLINIC_OR_DEPARTMENT_OTHER): Payer: Medicare Other | Admitting: Internal Medicine

## 2022-02-04 DIAGNOSIS — Z923 Personal history of irradiation: Secondary | ICD-10-CM

## 2022-02-04 DIAGNOSIS — M8788 Other osteonecrosis, other site: Secondary | ICD-10-CM

## 2022-02-04 DIAGNOSIS — M272 Inflammatory conditions of jaws: Secondary | ICD-10-CM | POA: Diagnosis not present

## 2022-02-04 DIAGNOSIS — C411 Malignant neoplasm of mandible: Secondary | ICD-10-CM | POA: Diagnosis not present

## 2022-02-04 NOTE — Progress Notes (Addendum)
[name], [name] ([id number]) Visit Report for 02/04/2022 HBO Details Patient Name: Date of Service: [name] 02/04/2022 1:00 PM Medical Record Number: [id number] Patient Account Number: [id number] Date of Birth/Sex: Treating RN: April 22, 1961 (61 y.o. 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, Jaw HBO Treatment Details Treatment Number: 56 Patient Type: Outpatient Chamber Type: Monoplace Chamber Serial #: [id number] Treatment Protocol: 2.5 ATA with 90 minutes oxygen, with two 5 minute air breaks Treatment Details Compression Rate Down: 2.0 psi / minute De-Compression Rate Up: A breaks and breathing ir Compress Tx Pressure periods Decompress Decompress Begins Reached (leave unused spaces Begins Ends blank) Chamber Pressure (ATA 1 2.5 2.5 2.5 2.5 2.5 - - 2.5 1 ) Clock Time (24 hr) 12:12 12:24 12:54 13:00 13:30 13:35 - - 14:05 14:16 Treatment Length: 124 (minutes) Treatment Segments: 4 Vital Signs Capillary Blood Glucose Reference Range: 80 - 120 mg / dl HBO Diabetic Blood Glucose Intervention Range: <131 mg/dl or >249 mg/dl Time Vitals Blood Respiratory Capillary Blood Glucose Pulse Action Type: Pulse: Temperature: Taken: Pressure: Rate: Glucose (mg/dl): Meter #: Oximetry (%) Taken: Pre 12:03 119/75 84 16 98.1 Post 14:22 130/84 67 16 97.8 Treatment Response Treatment Toleration: Well Treatment Completion Status: Treatment Completed without Adverse Event Physician HBO Attestation: I certify that I supervised this HBO treatment in accordance with Medicare guidelines. A trained emergency response team  is readily available per Yes hospital policies and procedures. Continue HBOT as ordered. Yes Electronic Signature(s) Signed: 02/04/2022 4:20:20 PM By: [name] DO Previous Signature: 02/04/2022 2:44:18 PM Version By: [name] EMT Previous Signature: 02/04/2022 1:15:26 PM Version By: [name] EMT Entered By: [name] on 02/04/2022 15:18:48 -------------------------------------------------------------------------------- HBO Safety Checklist Details Patient Name: Date of Service: [name] 02/04/2022 1:00 PM Medical Record Number: [id number] Patient Account Number: [id number] Date of Birth/Sex: Treating RN: 30-Mar-1961 (61 y.o. [name] Primary Care [name]: [name] Other Clinician: [name] Referring [name]: Treating Shaneka Efaw/Extender: [name] Weeks in Treatment: 13 HBO Safety Checklist Items Safety Checklist Consent Form Signed Patient voided / foley secured and emptied When did you last eato 1000 Last dose of injectable or oral agent NA Ostomy pouch emptied and vented if applicable NA All implantable devices assessed, documented and approved NA Intravenous access site secured and place NA Valuables secured Linens and cotton and cotton/polyester blend (less than 51% polyester) Personal oil-based products / skin lotions / body lotions removed Wigs or hairpieces removed NA Smoking or tobacco materials removed Books / newspapers / magazines / loose paper removed Cologne, aftershave, perfume and deodorant removed Jewelry removed (may wrap wedding band) NA Make-up removed Hair care products removed Battery operated devices (external) removed Heating patches and chemical warmers removed Titanium eyewear removed NA Nail polish cured greater than 10 hours Casting material cured greater than 10 hours NA Hearing aids removed NA Loose dentures or partials removed NA Prosthetics have been  removed NA Patient demonstrates correct use of air break device (if applicable) Patient concerns have been addressed Patient grounding bracelet on and cord attached to chamber Specifics for Inpatients (complete in addition to above) Medication sheet sent with patient NA Intravenous medications needed or due during therapy sent  with patient NA Drainage tubes (e.g. nasogastric tube or chest tube secured and vented) NA Endotracheal or Tracheotomy tube secured NA Cuff deflated of air and inflated with saline NA Airway suctioned NA Notes The safety checklist was done before the treatment was started. Electronic Signature(s) Signed: 02/04/2022 1:14:42 PM By: [name] EMT Entered By: [name] on 02/04/2022 13:14:42

## 2022-02-04 NOTE — Progress Notes (Signed)
[name], [name] ([id number]) Visit Report for 02/04/2022 Problem List Details Patient Name: Date of Service: [name] 02/04/2022 1:00 PM Medical Record Number: [id number] Patient Account Number: [id number] Date of Birth/Sex: Treating RN: 1961-02-06 (61 y.o. 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 02/04/2022 Medical Record Number: [id number] Patient Account Number: [id number] Date of Birth/Sex: Treating RN: December 12, 1960 (61 y.o. 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

## 2022-02-04 NOTE — Progress Notes (Addendum)
ZAKIRA, RESSEL ([id number]) Visit Report for 02/04/2022 Arrival Information Details Patient Name: Date of Service: [name] 02/04/2022 1:00 PM Medical Record Number: [id number] Patient Account Number: [id number] Date of Birth/Sex: Treating RN: [birth year] (61 y.o. [name] Primary Care Diondre Pulis: [name] Other Clinician: [name] Referring Umeka Wrench: Treating Ahtziry Saathoff/Extender: [name] in Treatment: 85 Visit Information History Since Last Visit All ordered tests and consults were completed: Yes Patient Arrived: Ambulatory Added or deleted any medications: No Arrival Time: 11:57 Any new allergies or adverse reactions: No Accompanied By: None Had a fall or experienced change in No Transfer Assistance: None activities of daily living that may affect Patient Identification Verified: Yes risk of falls: Secondary Verification Process Completed: Yes Signs or symptoms of abuse/neglect since last visito No Patient Requires Transmission-Based Precautions: No Hospitalized since last visit: No Patient Has Alerts: No Implantable device outside of the clinic excluding No cellular tissue based products placed in the center since last visit: Pain Present Now: No Electronic Signature(s) Signed: 02/04/2022 1:12:58 PM By: [name] EMT Entered By: [name] on 02/04/2022 13:12:58 -------------------------------------------------------------------------------- Encounter Discharge Information Details Patient Name: Date of Service: [name] 02/04/2022 1:00 PM Medical Record Number: [id number] Patient Account Number: [id number] Date of Birth/Sex: Treating RN: Jan 19, 1961 (60 y.o. [name] Primary Care Kenise Barraco: [name] Other Clinician: [name] Referring Addaleigh [name]: Treating Simya Tercero/Extender: [name] in Treatment: 13 Encounter Discharge Information  Items Discharge Condition: Stable Ambulatory Status: Ambulatory Discharge Destination: Home Transportation: Private Auto Accompanied By: None Schedule Follow-up Appointment: Yes Clinical Summary of Care: Electronic Signature(s) Signed: 02/04/2022 2:45:28 PM By: [name] EMT Entered By: [name] on 02/04/2022 14:45:27 -------------------------------------------------------------------------------- Vitals Details Patient Name: Date of Service: [name] 02/04/2022 1:00 PM Medical Record Number: [id number] Patient Account Number: [id number] Date of Birth/Sex: Treating RN: October 23, 1960 (61 y.o. [name] Primary Care Mozetta Murfin: [name] Other Clinician: [name] Referring Dale Ribeiro: Treating Shayma Pfefferle/Extender: [name] Weeks in Treatment: 13 Vital Signs Time Taken: 12:03 Temperature (F): 98.1 Height (in): 60 Pulse (bpm): 84 Weight (lbs): 88 Respiratory Rate (breaths/min): 16 Body Mass Index (BMI): 17.2 Blood Pressure (mmHg): 119/75 Reference Range: 80 - 120 mg / dl Electronic Signature(s) Signed: 02/04/2022 1:13:22 PM By: [name] EMT Entered By: [name] on 02/04/2022 13:13:22

## 2022-02-05 ENCOUNTER — Encounter (HOSPITAL_BASED_OUTPATIENT_CLINIC_OR_DEPARTMENT_OTHER): Payer: Medicare Other | Admitting: Internal Medicine

## 2022-02-06 ENCOUNTER — Encounter (HOSPITAL_BASED_OUTPATIENT_CLINIC_OR_DEPARTMENT_OTHER): Payer: Medicare Other | Admitting: General Surgery

## 2022-02-06 DIAGNOSIS — M8788 Other osteonecrosis, other site: Secondary | ICD-10-CM | POA: Diagnosis not present

## 2022-02-06 NOTE — Progress Notes (Addendum)
LAVORIS, CANIZALES ([id number]) Visit Report for 02/06/2022 HBO Details Patient Name: Date of Service: [name] 02/06/2022 1:00 PM Medical Record Number: [id number] Patient Account Number: [id number] Date of Birth/Sex: Treating RN: 12-Dec-1960 (61 y.o. 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, Jaw HBO Treatment Details Treatment Number: 57 Patient Type: Outpatient Chamber Type: Monoplace Chamber Serial #: [id number] Treatment Protocol: 2.5 ATA with 90 minutes oxygen, with two 5 minute air breaks Treatment Details Compression Rate Down: 2.0 psi / minute De-Compression Rate Up: A breaks and breathing ir Compress Tx Pressure periods Decompress Decompress Begins Reached (leave unused spaces Begins Ends blank) Chamber Pressure (ATA 1 2.5 2.5 2.5 2.5 2.5 - - 2.5 1 ) Clock Time (24 hr) 12:22 12:36 13:06 13:11 13:41 13:46 - - 14:16 14:30 Treatment Length: 128 (minutes) Treatment Segments: 4 Vital Signs Capillary Blood Glucose Reference Range: 80 - 120 mg / dl HBO Diabetic Blood Glucose Intervention Range: <131 mg/dl or >249 mg/dl Time Vitals Blood Respiratory Capillary Blood Glucose Pulse Action Type: Pulse: Temperature: Taken: Pressure: Rate: Glucose (mg/dl): Meter #: Oximetry (%) Taken: Pre 12:14 111/84 100 16 98.6 Post 14:32 130/86 73 18 98.4 Treatment Response Treatment Toleration: Well Treatment Completion Status: Treatment Completed without Adverse Event Physician HBO Attestation: I certify that I supervised this HBO treatment in accordance with Medicare guidelines. A trained emergency response team  is readily available per Yes hospital policies and procedures. Continue HBOT as ordered. Yes Electronic Signature(s) Signed: 02/06/2022 3:54:08 PM By: [name] MD FACS Previous Signature: 02/06/2022 2:43:03 PM Version By: [name] EMT Entered By: [name] on 02/06/2022 15:54:07 -------------------------------------------------------------------------------- HBO Safety Checklist Details Patient Name: Date of Service: [name] 02/06/2022 1:00 PM Medical Record Number: [id number] Patient Account Number: [id number] Date of Birth/Sex: Treating RN: [birth year] (61 y.o. [name], [name] Primary Care Esha Fincher: [name] Other Clinician: [name] Referring Umaiza Matusik: Treating Chrstopher Malenfant/Extender: [name] Weeks in Treatment: 13 HBO Safety Checklist Items Safety Checklist Consent Form Signed Patient voided / foley secured and emptied When did you last eato 1115 Last dose of injectable or oral agent NA Ostomy pouch emptied and vented if applicable NA All implantable devices assessed, documented and approved NA Intravenous access site secured and place NA Valuables secured Linens and cotton and cotton/polyester blend (less than 51% polyester) Personal oil-based products / skin lotions / body lotions removed Wigs or hairpieces removed NA Smoking or tobacco materials removed Books / newspapers / magazines / loose paper removed Cologne, aftershave, perfume and deodorant removed Jewelry removed (may wrap wedding band) NA Make-up removed Hair care products removed Battery operated devices (external) removed Heating patches and chemical warmers removed Titanium eyewear removed NA Nail polish cured greater than 10 hours Casting material cured greater than 10 hours NA Hearing aids removed NA Loose dentures or partials removed NA Prosthetics have been removed NA Patient demonstrates correct use of air break device  (if applicable) Patient concerns have been addressed Patient grounding bracelet on and cord attached to chamber Specifics for Inpatients (complete in addition to above) Medication sheet sent with patient NA Intravenous medications needed or due during therapy sent with patient NA Drainage tubes (e.g. nasogastric tube or  chest tube secured and vented) NA Endotracheal or Tracheotomy tube secured NA Cuff deflated of air and inflated with saline NA Airway suctioned NA Notes The safety checklist was done before the treatment was started. Electronic Signature(s) Signed: 02/06/2022 2:28:26 PM By: [name] EMT Previous Signature: 02/06/2022 2:23:54 PM Version By: [name] EMT Entered By: [name] on 02/06/2022 14:28:26

## 2022-02-06 NOTE — Progress Notes (Addendum)
[name], [name] ([id number]) Visit Report for 02/06/2022 Arrival Information Details Patient Name: Date of Service: [name] 02/06/2022 1:00 PM Medical Record Number: [id number] Patient Account Number: [id number] Date of Birth/Sex: Treating RN: [birth year] (61 y.o. [name], [name] Primary Care [name]: [name] Other Clinician: [name] Referring Meghanne Pletz: Treating Hadley Soileau/Extender: Mathews Argyle in Treatment: 13 Visit Information History Since Last Visit All ordered tests and consults were completed: Yes Patient Arrived: Ambulatory Added or deleted any medications: No Arrival Time: 12:06 Any new allergies or adverse reactions: No Accompanied By: None Had a fall or experienced change in No Transfer Assistance: None activities of daily living that may affect Patient Identification Verified: Yes risk of falls: Secondary Verification Process Completed: Yes Signs or symptoms of abuse/neglect since last visito No Patient Requires Transmission-Based Precautions: No Hospitalized since last visit: No Patient Has Alerts: No Implantable device outside of the clinic excluding No cellular tissue based products placed in the center since last visit: Pain Present Now: No Electronic Signature(s) Signed: 02/06/2022 2:22:30 PM By: [name] EMT Entered By: [name] on 02/06/2022 14:22:30 -------------------------------------------------------------------------------- Encounter Discharge Information Details Patient Name: Date of Service: [name] 02/06/2022 1:00 PM Medical Record Number: [id number] Patient Account Number: [id number] Date of Birth/Sex: Treating RN: 1961-03-28 (61 y.o. [name] Primary Care Phenix Grein: [name] Other Clinician: [name] Referring Kaylyn Garrow: Treating Denney Shein/Extender: Mathews Argyle in Treatment: 13 Encounter Discharge Information  Items Discharge Condition: Stable Ambulatory Status: Ambulatory Discharge Destination: Home Transportation: Private Auto Accompanied By: None Schedule Follow-up Appointment: Yes Clinical Summary of Care: Electronic Signature(s) Signed: 02/06/2022 2:44:48 PM By: [name] EMT Entered By: [name] on 02/06/2022 14:44:48 -------------------------------------------------------------------------------- Vitals Details Patient Name: Date of Service: [name] 02/06/2022 1:00 PM Medical Record Number: [id number] Patient Account Number: [id number] Date of Birth/Sex: Treating RN: 06-18-1961 (61 y.o. [name], [name] Primary Care [name]: [name] Other Clinician: [name] Referring [name]: Treating Antanette Richwine/Extender: [name] Weeks in Treatment: 13 Vital Signs Time Taken: 12:14 Temperature (F): 98.6 Height (in): 60 Pulse (bpm): 100 Weight (lbs): 88 Respiratory Rate (breaths/min): 16 Body Mass Index (BMI): 17.2 Blood Pressure (mmHg): 111/84 Reference Range: 80 - 120 mg / dl Electronic Signature(s) Signed: 02/06/2022 2:22:54 PM By: [name] EMT Entered By: [name] on 02/06/2022 14:22:53

## 2022-02-06 NOTE — Progress Notes (Signed)
[name], [name] ([id number]) Visit Report for 02/06/2022 Problem List Details Patient Name: Date of Service: [name] 02/06/2022 1:00 PM Medical Record Number: [id number] Patient Account Number: [id number] Date of Birth/Sex: Treating RN: 1960/11/15 (61 y.o. 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 02/06/2022 Medical Record Number: [id number] Patient Account Number: [id number] Date of Birth/Sex: Treating RN: 1961/01/11 (61 y.o. 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

## 2022-02-07 ENCOUNTER — Encounter (HOSPITAL_BASED_OUTPATIENT_CLINIC_OR_DEPARTMENT_OTHER): Payer: Medicare Other | Admitting: General Surgery

## 2022-02-07 DIAGNOSIS — M8788 Other osteonecrosis, other site: Secondary | ICD-10-CM | POA: Diagnosis not present

## 2022-02-07 NOTE — Progress Notes (Signed)
ASHANTY, COLTRANE ([id number]) Visit Report for 02/07/2022 SuperBill Details Patient Name: Date of Service: [name] 02/07/2022 Medical Record Number: [id number] Patient Account Number: [id number] Date of Birth/Sex: Treating RN: May 24, 1961 (61 y.o. 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

## 2022-02-07 NOTE — Progress Notes (Signed)
Theresa, Dillon ([id number]) Visit Report for 02/07/2022 Arrival Information Details Patient Name: Date of Service: [name] 02/07/2022 1:00 PM Medical Record Number: [id number] Patient Account Number: [id number] Date of Birth/Sex: Treating RN: [birth year] (61 y.o. [name], [name] Primary Care Stanley Lyness: [name] Other Clinician: Donavan Burnet Referring Ariahna Smiddy: Treating Jenasis Straley/Extender: Mathews Argyle in Treatment: 18 Visit Information History Since Last Visit All ordered tests and consults were completed: Yes Patient Arrived: Ambulatory Added or deleted any medications: No Arrival Time: 12:08 Any new allergies or adverse reactions: No Accompanied By: self Had a fall or experienced change in No Transfer Assistance: None activities of daily living that may affect Patient Identification Verified: Yes risk of falls: Secondary Verification Process Completed: Yes Signs or symptoms of abuse/neglect since last visito No Patient Requires Transmission-Based Precautions: No Hospitalized since last visit: No Patient Has Alerts: No Implantable device outside of the clinic excluding Yes cellular tissue based products placed in the center since last visit: Pain Present Now: No Electronic Signature(s) Signed: 02/07/2022 3:50:05 PM By: Donavan Burnet CHT EMT BS , , Entered By: Donavan Burnet on 02/07/2022 15:50:04 -------------------------------------------------------------------------------- Encounter Discharge Information Details Patient Name: Date of Service: [name] 02/07/2022 1:00 PM Medical Record Number: [id number] Patient Account Number: [id number] Date of Birth/Sex: Treating RN: Nov 25, 1960 (61 y.o. [name] Primary Care Khairi Garman: [name] Other Clinician: Donavan Burnet Referring Keanan Melander: Treating Orlandus Borowski/Extender: Mathews Argyle in Treatment:  13 Encounter Discharge Information Items Discharge Condition: Stable Ambulatory Status: Ambulatory Discharge Destination: Home Transportation: Private Auto Accompanied By: self Schedule Follow-up Appointment: No Clinical Summary of Care: Electronic Signature(s) Signed: 02/07/2022 3:56:17 PM By: Donavan Burnet CHT EMT BS , , Entered By: Donavan Burnet on 02/07/2022 15:56:16 -------------------------------------------------------------------------------- Vitals Details Patient Name: Date of Service: [name] 02/07/2022 1:00 PM Medical Record Number: [id number] Patient Account Number: [id number] Date of Birth/Sex: Treating RN: May 09, 1961 (61 y.o. [name] Primary Care Cythia Bachtel: [name] Other Clinician: Donavan Burnet Referring Jahshua Bonito: Treating Kaylynne Andres/Extender: Mathews Argyle in Treatment: 13 Vital Signs Time Taken: 12:14 Temperature (F): 98.1 Height (in): 60 Pulse (bpm): 79 Weight (lbs): 88 Respiratory Rate (breaths/min): 18 Body Mass Index (BMI): 17.2 Blood Pressure (mmHg): 144/86 Reference Range: 80 - 120 mg / dl Electronic Signature(s) Signed: 02/07/2022 3:50:34 PM By: Donavan Burnet CHT EMT BS , , Entered By: Donavan Burnet on 02/07/2022 15:50:34

## 2022-02-07 NOTE — Progress Notes (Addendum)
Theresa, Dillon ([id number]) Visit Report for 02/07/2022 HBO Details Patient Name: Date of Service: [name] 02/07/2022 1:00 PM Medical Record Number: [id number] Patient Account Number: [id number] Date of Birth/Sex: Treating RN: 1961-05-24 (61 y.o. 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, Jaw HBO Treatment Details Treatment Number: 58 Patient Type: Outpatient Chamber Type: Monoplace Chamber Serial #: [id number] Treatment Protocol: 2.5 ATA with 90 minutes oxygen, with two 5 minute air breaks Treatment Details Compression Rate Down: 2.0 psi / minute De-Compression Rate Up: 2.0 psi / minute A breaks and breathing ir Compress Tx Pressure periods Decompress Decompress Begins Reached (leave unused spaces Begins Ends blank) Chamber Pressure (ATA 1 2.5 2.5 2.5 2.5 2.5 - - 2.5 1 ) Clock Time (24 hr) 12:25 12:36 13:06 13:11 13:41 13:46 - - 14:16 14:27 Treatment Length: 122 (minutes) Treatment Segments: 4 Vital Signs Capillary Blood Glucose Reference Range: 80 - 120 mg / dl HBO Diabetic Blood Glucose Intervention Range: <131 mg/dl or >249 mg/dl Type: Time Vitals Blood Respiratory Capillary Blood Glucose Pulse Action Pulse: Temperature: Taken: Pressure: Rate: Glucose (mg/dl): Meter #: Oximetry (%) Taken: Pre 12:14 144/86 79 18 98.1 none per protocol Post 14:30 121/84 64 18 98.4 none per protocol Treatment Response Treatment Toleration: Well Treatment Completion Status: Treatment Completed without Adverse Event Physician HBO Attestation: I certify that I supervised this HBO treatment in accordance  with Medicare guidelines. A trained emergency response team is readily available per Yes hospital policies and procedures. Continue HBOT as ordered. Yes Electronic Signature(s) Signed: 02/07/2022 4:42:03 PM By: [name] MD FACS Previous Signature: 02/07/2022 3:55:33 PM Version By: Donavan Burnet CHT EMT BS , , Entered By: [name] on 02/07/2022 16:42:02 -------------------------------------------------------------------------------- HBO Safety Checklist Details Patient Name: Date of Service: [name] Gearldine Dillon D. 02/07/2022 1:00 PM Medical Record Number: [id number] Patient Account Number: [id number] Date of Birth/Sex: Treating RN: 10-12-1960 (61 y.o. [name] Primary Care Coleta Grosshans: [name] Other Clinician: Donavan Burnet Referring Helayna Dun: Treating Eugen Jeansonne/Extender: [name] Weeks in Treatment: 13 HBO Safety Checklist Items Safety Checklist Consent Form Signed Patient voided / foley secured and emptied When did you last eato 0600 Last dose of injectable or oral agent n/a Ostomy pouch emptied and vented if applicable NA All implantable devices assessed, documented and approved NA Intravenous access site secured and place NA Valuables secured Linens and cotton and cotton/polyester blend (less than 51% polyester) Personal oil-based products / skin lotions / body lotions removed Wigs or hairpieces removed NA Smoking or tobacco materials removed NA Books / newspapers / magazines / loose paper removed Cologne, aftershave, perfume and deodorant removed Jewelry removed (may wrap wedding band) Make-up removed Hair care products removed Battery operated devices (external) removed Heating patches and chemical warmers removed Titanium eyewear removed NA Nail polish cured greater than 10 hours Casting material cured greater than 10 hours NA Hearing aids removed NA Loose dentures or partials  removed NA Prosthetics have been removed NA Patient demonstrates correct use of air break device (if applicable) Patient concerns have been addressed Patient grounding bracelet on and cord attached to chamber Specifics for Inpatients (complete in addition to above) Medication sheet sent with patient NA Intravenous medications needed  or due during therapy sent with patient NA Drainage tubes (e.g. nasogastric tube or chest tube secured and vented) NA Endotracheal or Tracheotomy tube secured NA Cuff deflated of air and inflated with saline NA Airway suctioned NA Notes Paper version used prior to treatment. Electronic Signature(s) Signed: 02/07/2022 3:53:53 PM By: Donavan Burnet CHT EMT BS , , Previous Signature: 02/07/2022 3:53:11 PM Version By: Donavan Burnet CHT EMT BS , , Entered By: Donavan Burnet on 02/07/2022 15:53:52

## 2022-02-08 ENCOUNTER — Encounter (HOSPITAL_BASED_OUTPATIENT_CLINIC_OR_DEPARTMENT_OTHER): Payer: Medicare Other | Admitting: General Surgery

## 2022-02-08 DIAGNOSIS — M8788 Other osteonecrosis, other site: Secondary | ICD-10-CM | POA: Diagnosis not present

## 2022-02-08 NOTE — Progress Notes (Addendum)
[name], [name] ([id number]) Visit Report for 02/08/2022 HBO Details Patient Name: Date of Service: [name] 02/08/2022 1:00 PM Medical Record Number: [id number] Patient Account Number: [id number] Date of Birth/Sex: Treating RN: Jan 09, 1961 (61 y.o. 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, Jaw HBO Treatment Details Treatment Number: 59 Patient Type: Outpatient Chamber Type: Monoplace Chamber Serial #: [id number] Treatment Protocol: 2.5 ATA with 90 minutes oxygen, with two 5 minute air breaks Treatment Details Compression Rate Down: 2.0 psi / minute De-Compression Rate Up: 2.0 psi / minute A breaks and breathing ir Compress Tx Pressure periods Decompress Decompress Begins Reached (leave unused spaces Begins Ends blank) Chamber Pressure (ATA 1 2.5 2.5 2.5 2.5 2.5 - - 2.5 1 ) Clock Time (24 hr) 12:19 12:32 13:02 13:07 13:37 13:42 - - 14:12 14:22 Treatment Length: 123 (minutes) Treatment Segments: 4 Vital Signs Capillary Blood Glucose Reference Range: 80 - 120 mg / dl HBO Diabetic Blood Glucose Intervention Range: <131 mg/dl or >249 mg/dl Time Vitals Blood Respiratory Capillary Blood Glucose Pulse Action Type: Pulse: Temperature: Taken: Pressure: Rate: Glucose (mg/dl): Meter #: Oximetry (%) Taken: Pre 12:09 128/88 95 20 98.1 Post 14:25 125/78 69 18 98.1 Treatment Response Treatment Toleration: Well Treatment Completion Status: Treatment Completed without Adverse Event Physician HBO Attestation: I certify that I supervised this HBO treatment in accordance with Medicare guidelines. A trained  emergency response team is readily available per Yes hospital policies and procedures. Continue HBOT as ordered. Yes Electronic Signature(s) Signed: 02/08/2022 3:36:37 PM By: [name] MD FACS Previous Signature: 02/08/2022 2:53:19 PM Version By: [name] EMT Entered By: [name] on 02/08/2022 15:36:36 -------------------------------------------------------------------------------- HBO Safety Checklist Details Patient Name: Date of Service: [name] 02/08/2022 1:00 PM Medical Record Number: [id number] Patient Account Number: [id number] Date of Birth/Sex: Treating RN: [birth year] (61 y.o. [name] Primary Care Arine Foley: [name] Other Clinician: [name] Referring Vincen Bejar: Treating Zebedee Segundo/Extender: [name] Weeks in Treatment: 13 HBO Safety Checklist Items Safety Checklist Consent Form Signed Patient voided / foley secured and emptied When did you last eato 0900 Last dose of injectable or oral agent NA Ostomy pouch emptied and vented if applicable NA All implantable devices assessed, documented and approved NA Intravenous access site secured and place NA Valuables secured Linens and cotton and cotton/polyester blend (less than 51% polyester) Personal oil-based products / skin lotions / body lotions removed Wigs or hairpieces removed NA Smoking or tobacco materials removed Books / newspapers / magazines / loose paper removed Cologne, aftershave, perfume and deodorant removed Jewelry removed (may wrap wedding band) NA Make-up removed Hair care products removed Battery operated devices (external) removed Heating patches and chemical warmers removed Titanium eyewear removed NA Nail polish cured greater than 10 hours Casting material cured greater than 10 hours NA Hearing aids removed NA Loose dentures or partials removed NA Prosthetics have been removed NA Patient demonstrates  correct use of air break device (if applicable) Patient concerns have been addressed Patient grounding bracelet on and cord attached to chamber Specifics for Inpatients (complete in addition to above) Medication sheet sent with patient NA Intravenous medications needed or due during therapy sent with patient NA Drainage tubes (  e.g. nasogastric tube or chest tube secured and vented) NA Endotracheal or Tracheotomy tube secured NA Cuff deflated of air and inflated with saline NA Airway suctioned NA Notes The safety checklist was done before the treatment was started. Electronic Signature(s) Signed: 02/08/2022 2:52:07 PM By: [name] EMT Entered By: [name] on 02/08/2022 14:52:06

## 2022-02-08 NOTE — Progress Notes (Addendum)
[name], [name] ([id number]) Visit Report for 02/08/2022 Arrival Information Details Patient Name: Date of Service: [name] 02/08/2022 1:00 PM Medical Record Number: [id number] Patient Account Number: [id number] Date of Birth/Sex: Treating RN: June 10, 1961 (61 y.o. [name] Primary Care Izzabelle Bouley: [name] Other Clinician: [name] Referring Rosamary Boudreau: Treating Carrin Vannostrand/Extender: Mathews Argyle in Treatment: 27 Visit Information History Since Last Visit All ordered tests and consults were completed: Yes Patient Arrived: Ambulatory Added or deleted any medications: No Arrival Time: 11:55 Any new allergies or adverse reactions: No Accompanied By: None Had a fall or experienced change in No Transfer Assistance: None activities of daily living that may affect Patient Identification Verified: Yes risk of falls: Secondary Verification Process Completed: Yes Signs or symptoms of abuse/neglect since last visito No Patient Requires Transmission-Based Precautions: No Hospitalized since last visit: No Patient Has Alerts: No Implantable device outside of the clinic excluding No cellular tissue based products placed in the center since last visit: Pain Present Now: No Electronic Signature(s) Signed: 02/08/2022 2:49:48 PM By: [name] EMT Entered By: [name] on 02/08/2022 14:49:48 -------------------------------------------------------------------------------- Encounter Discharge Information Details Patient Name: Date of Service: [name] 02/08/2022 1:00 PM Medical Record Number: [id number] Patient Account Number: [id number] Date of Birth/Sex: Treating RN: [birth year] (61 y.o. [name] Primary Care [name]: [name] Other Clinician: [name] Referring [name]: Treating Antwion Carpenter/Extender: Mathews Argyle in Treatment: 13 Encounter Discharge  Information Items Discharge Condition: Stable Ambulatory Status: Ambulatory Discharge Destination: Home Transportation: Private Auto Accompanied By: None Schedule Follow-up Appointment: Yes Clinical Summary of Care: Electronic Signature(s) Signed: 02/08/2022 2:54:15 PM By: [name] EMT Entered By: [name] on 02/08/2022 14:54:15 -------------------------------------------------------------------------------- Vitals Details Patient Name: Date of Service: [name] 02/08/2022 1:00 PM Medical Record Number: [id number] Patient Account Number: [id number] Date of Birth/Sex: Treating RN: 1961-06-17 (61 y.o. [name] Primary Care Brandonn Capelli: [name] Other Clinician: [name] Referring [name]: Treating Ammi Hutt/Extender: [name] Weeks in Treatment: 13 Vital Signs Time Taken: 12:09 Temperature (F): 98.1 Height (in): 60 Pulse (bpm): 95 Weight (lbs): 88 Respiratory Rate (breaths/min): 20 Body Mass Index (BMI): 17.2 Blood Pressure (mmHg): 128/88 Reference Range: 80 - 120 mg / dl Electronic Signature(s) Signed: 02/08/2022 2:50:32 PM By: [name] EMT Entered By: [name] on 02/08/2022 14:50:32

## 2022-02-08 NOTE — Progress Notes (Signed)
PHOUA, HOADLEY ([id number]) Visit Report for 02/08/2022 Problem List Details Patient Name: Date of Service: [name] 02/08/2022 1:00 PM Medical Record Number: [id number] Patient Account Number: [id number] Date of Birth/Sex: Treating RN: 28-Feb-1961 (61 y.o. 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 02/08/2022 Medical Record Number: [id number] Patient Account Number: [id number] Date of Birth/Sex: Treating RN: 1961/01/14 (61 y.o. 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

## 2022-02-11 ENCOUNTER — Encounter (HOSPITAL_BASED_OUTPATIENT_CLINIC_OR_DEPARTMENT_OTHER): Payer: Medicare Other | Admitting: Internal Medicine

## 2022-02-11 DIAGNOSIS — M8788 Other osteonecrosis, other site: Secondary | ICD-10-CM | POA: Diagnosis not present

## 2022-02-12 ENCOUNTER — Encounter (HOSPITAL_BASED_OUTPATIENT_CLINIC_OR_DEPARTMENT_OTHER): Payer: Medicare Other | Admitting: Internal Medicine

## 2022-02-12 DIAGNOSIS — C411 Malignant neoplasm of mandible: Secondary | ICD-10-CM | POA: Diagnosis not present

## 2022-02-12 DIAGNOSIS — Z923 Personal history of irradiation: Secondary | ICD-10-CM | POA: Diagnosis not present

## 2022-02-12 DIAGNOSIS — M272 Inflammatory conditions of jaws: Secondary | ICD-10-CM | POA: Diagnosis not present

## 2022-02-12 DIAGNOSIS — M8788 Other osteonecrosis, other site: Secondary | ICD-10-CM | POA: Diagnosis not present

## 2022-02-13 ENCOUNTER — Encounter (HOSPITAL_BASED_OUTPATIENT_CLINIC_OR_DEPARTMENT_OTHER): Payer: Medicare Other | Admitting: General Surgery

## 2022-02-13 DIAGNOSIS — M8788 Other osteonecrosis, other site: Secondary | ICD-10-CM | POA: Diagnosis not present

## 2022-02-13 NOTE — Progress Notes (Addendum)
LITICIA, GASIOR ([id number]) Visit Report for 02/12/2022 HBO Details Patient Name: Date of Service: [name] 02/12/2022 1:00 PM Medical Record Number: [id number] Patient Account Number: [id number] Date of Birth/Sex: Treating RN: 11-08-1960 (61 y.o. 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, Jaw HBO Treatment Details Treatment Number: 61 Patient Type: Outpatient Chamber Type: Monoplace Chamber Serial #: [id number] Treatment Protocol: 2.5 ATA with 90 minutes oxygen, with two 5 minute air breaks Treatment Details Compression Rate Down: 2.0 psi / minute De-Compression Rate Up: 2.0 psi / minute A breaks and breathing ir Compress Tx Pressure periods Decompress Decompress Begins Reached (leave unused spaces Begins Ends blank) Chamber Pressure (ATA 1 2.5 2.5 2.5 2.5 2.5 - - 2.5 1 ) Clock Time (24 hr) 12:09 12:22 12:52 12:57 13:27 13:32 - - 14:02 14:13 Treatment Length: 124 (minutes) Treatment Segments: 4 Vital Signs Capillary Blood Glucose Reference Range: 80 - 120 mg / dl HBO Diabetic Blood Glucose Intervention Range: <131 mg/dl or >249 mg/dl Time Vitals Blood Respiratory Capillary Blood Glucose Pulse Action Type: Pulse: Temperature: Taken: Pressure: Rate: Glucose (mg/dl): Meter #: Oximetry (%) Taken: Pre 11:51 111/75 72 16 98.4 Post 14:15 111/75 62 16 98.1 Treatment Response Treatment Toleration: Well Treatment Completion Status: Treatment Completed without Adverse Event Electronic Signature(s) Signed: 02/12/2022 2:20:12 PM By: [name] EMT Signed: 02/12/2022 4:20:15 PM By: [name]  DO Previous Signature: 02/12/2022 1:59:30 PM Version By: [name] EMT Entered By: [name] on 02/12/2022 14:20:11 -------------------------------------------------------------------------------- HBO Safety Checklist Details Patient Name: Date of Service: [name] 02/12/2022 1:00 PM Medical Record Number: [id number] Patient Account Number: [id number] Date of Birth/Sex: Treating RN: 06/03/1961 (61 y.o. [name] Primary Care Silvino Selman: [name] Other Clinician: [name] Referring Shalin Linders: Treating Kajah Santizo/Extender: [name] Weeks in Treatment: 14 HBO Safety Checklist Items Safety Checklist Consent Form Signed Patient voided / foley secured and emptied When did you last eato 1000 Last dose of injectable or oral agent NA Ostomy pouch emptied and vented if applicable NA All implantable devices assessed, documented and approved NA Intravenous access site secured and place NA Valuables secured Linens and cotton and cotton/polyester blend (less than 51% polyester) Personal oil-based products / skin lotions / body lotions removed Wigs or hairpieces removed NA Smoking or tobacco materials removed Books / newspapers / magazines / loose paper removed Cologne, aftershave, perfume and deodorant removed Jewelry removed (may wrap wedding band) NA Make-up removed Hair care products removed Battery operated devices (external) removed Heating patches and chemical warmers removed Titanium eyewear removed NA Nail polish cured greater than 10 hours Casting material cured greater than 10 hours NA Hearing aids removed NA Loose dentures or partials removed NA Prosthetics have been removed NA Patient demonstrates correct use of air break device (if applicable) Patient concerns have been addressed Patient grounding bracelet on and cord attached to chamber Specifics for Inpatients (complete in addition to  above) Medication sheet sent with patient NA Intravenous medications needed or due during therapy sent with patient NA Drainage tubes (e.g. nasogastric tube or chest tube secured and vented) NA Endotracheal or Tracheotomy tube secured NA Cuff deflated of air and inflated with saline NA Airway suctioned NA  Notes The safety checklist was done before treatment was started. Electronic Signature(s) Signed: 02/12/2022 1:58:00 PM By: [name] EMT Entered By: [name] on 02/12/2022 13:58:00

## 2022-02-13 NOTE — Progress Notes (Signed)
ANALYCIA, KHOKHAR ([id number]) Visit Report for 02/12/2022 Problem List Details Patient Name: Date of Service: [name] 02/12/2022 1:00 PM Medical Record Number: [id number] Patient Account Number: [id number] Date of Birth/Sex: Treating RN: [birth year] (61 y.o. 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 02/12/2022 Medical Record Number: [id number] Patient Account Number: [id number] Date of Birth/Sex: Treating RN: Mar 21, 1961 (61 y.o. 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

## 2022-02-14 ENCOUNTER — Encounter (HOSPITAL_BASED_OUTPATIENT_CLINIC_OR_DEPARTMENT_OTHER): Payer: Medicare Other | Admitting: General Surgery

## 2022-02-14 DIAGNOSIS — M8788 Other osteonecrosis, other site: Secondary | ICD-10-CM | POA: Diagnosis not present

## 2022-02-14 NOTE — Progress Notes (Addendum)
[name], [name] ([id number]) Visit Report for 02/14/2022 HBO Details Patient Name: Date of Service: [name] 02/14/2022 10:00 A M Medical Record Number: [id number] Patient Account Number: [id number] Date of Birth/Sex: Treating RN: 02/26/1961 (61 y.o. 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, Jaw HBO Treatment Details Treatment Number: 63 Patient Type: Outpatient Chamber Type: Monoplace Chamber Serial #: [id number] Treatment Protocol: 2.5 ATA with 90 minutes oxygen, with two 5 minute air breaks Treatment Details Compression Rate Down: 2.0 psi / minute De-Compression Rate Up: 2.0 psi / minute A breaks and breathing ir Compress Tx Pressure periods Decompress Decompress Begins Reached (leave unused spaces Begins Ends blank) Chamber Pressure (ATA 1 2.5 2.5 2.5 2.5 2.5 - - 2.5 1 ) Clock Time (24 hr) 10:16 10:27 10:57 11:02 11:32 11:37 - - 12:07 12:18 Treatment Length: 122 (minutes) Treatment Segments: 4 Vital Signs Capillary Blood Glucose Reference Range: 80 - 120 mg / dl HBO Diabetic Blood Glucose Intervention Range: <131 mg/dl or >249 mg/dl Time Vitals Blood Respiratory Capillary Blood Glucose Pulse Action Type: Pulse: Temperature: Taken: Pressure: Rate: Glucose (mg/dl): Meter #: Oximetry (%) Taken: Pre 09:50 99/79 85 16 98 Post 12:21 132/66 63 16 98 Treatment Response Treatment Toleration: Well Treatment Completion Status: Treatment Completed without Adverse Event Treatment Notes The patient was given Afrin - 1 spray in both nostrils prior to HBO treatment for difficulty clearing ears. She  had no problems clearing. Physician HBO Attestation: I certify that I supervised this HBO treatment in accordance with Medicare guidelines. A trained emergency response team is readily available per Yes hospital policies and procedures. Continue HBOT as ordered. Yes Electronic Signature(s) Signed: 02/14/2022 4:49:04 PM By: [name] MD FACS Previous Signature: 02/14/2022 1:18:08 PM Version By: [name] EMT Entered By: [name] on 02/14/2022 16:49:04 -------------------------------------------------------------------------------- HBO Safety Checklist Details Patient Name: Date of Service: [name] 02/14/2022 10:00 A M Medical Record Number: [id number] Patient Account Number: [id number] Date of Birth/Sex: Treating RN: 1960/10/15 (61 y.o. [name] Primary Care Tomie Elko: [name] Other Clinician: [name] Referring Zahrah Sutherlin: Treating Yoshiko Keleher/Extender: [name] Weeks in Treatment: 14 HBO Safety Checklist Items Safety Checklist Consent Form Signed Patient voided / foley secured and emptied When did you last eato 0815 Last dose of injectable or oral agent NA Ostomy pouch emptied and vented if applicable NA All implantable devices assessed, documented and approved NA Intravenous access site secured and place NA Valuables secured Linens and cotton and cotton/polyester blend (less than 51% polyester) Personal oil-based products / skin lotions / body lotions removed Wigs or hairpieces removed NA Smoking or tobacco materials removed Books / newspapers / magazines / loose paper removed Cologne, aftershave, perfume and deodorant removed Jewelry removed (may wrap wedding band) NA Make-up removed Hair care products removed Battery operated devices (external) removed Heating patches and chemical warmers removed Titanium eyewear removed NA Nail polish cured greater than 10 hours Casting material  cured greater than 10 hours NA Hearing aids removed NA Loose dentures or partials removed NA Prosthetics have been removed NA Patient demonstrates correct use of air break device (if applicable) Patient concerns have been addressed Patient grounding bracelet on and cord attached to  chamber Specifics for Inpatients (complete in addition to above) Medication sheet sent with patient NA Intravenous medications needed or due during therapy sent with patient NA Drainage tubes (e.g. nasogastric tube or chest tube secured and vented) NA Endotracheal or Tracheotomy tube secured NA Cuff deflated of air and inflated with saline NA Airway suctioned NA Notes The safety checklist was done before the treatment was started. Electronic Signature(s) Signed: 02/14/2022 1:16:23 PM By: [name] EMT Entered By: [name] on 02/14/2022 13:16:22

## 2022-02-14 NOTE — Progress Notes (Signed)
Theresa, Dillon ([id number]) Visit Report for 02/13/2022 Arrival Information Details Patient Name: Date of Service: [name] 02/13/2022 1:00 PM Medical Record Number: [id number] Patient Account Number: [id number] Date of Birth/Sex: Treating RN: 1961/02/12 (61 y.o. [name] Primary Care Delara Shepheard: [name] Other Clinician: [name] Referring Tylique Aull: Treating Layten Aiken/Extender: Mathews Argyle in Treatment: 14 Visit Information History Since Last Visit All ordered tests and consults were completed: Yes Patient Arrived: Ambulatory Added or deleted any medications: No Arrival Time: 11:32 Any new allergies or adverse reactions: No Accompanied By: None Had a fall or experienced change in No Transfer Assistance: None activities of daily living that may affect Patient Identification Verified: Yes risk of falls: Secondary Verification Process Completed: Yes Signs or symptoms of abuse/neglect since last visito No Patient Requires Transmission-Based Precautions: No Hospitalized since last visit: No Patient Has Alerts: No Implantable device outside of the clinic excluding No cellular tissue based products placed in the center since last visit: Pain Present Now: No Electronic Signature(s) Signed: 02/13/2022 2:43:29 PM By: [name] EMT Entered By: [name] on 02/13/2022 14:43:28 -------------------------------------------------------------------------------- Encounter Discharge Information Details Patient Name: Date of Service: [name] 02/13/2022 1:00 PM Medical Record Number: [id number] Patient Account Number: [id number] Date of Birth/Sex: Treating RN: 1960/08/07 (61 y.o. [name] Primary Care Keithan Dileonardo: [name] Other Clinician: [name] Referring Eligh Rybacki: Treating Taylee Gunnells/Extender: Mathews Argyle in Treatment: 14 Encounter Discharge  Information Items Discharge Condition: Stable Ambulatory Status: Ambulatory Discharge Destination: Home Transportation: Private Auto Accompanied By: None Schedule Follow-up Appointment: Yes Clinical Summary of Care: Electronic Signature(s) Signed: 02/13/2022 2:50:28 PM By: [name] EMT Entered By: [name] on 02/13/2022 14:50:27 -------------------------------------------------------------------------------- Vitals Details Patient Name: Date of Service: [name] 02/13/2022 1:00 PM Medical Record Number: [id number] Patient Account Number: [id number] Date of Birth/Sex: Treating RN: 06/10/1961 (61 y.o. [name] Primary Care [name]: [name] Other Clinician: [name] Referring Enola Siebers: Treating Shoichi Mielke/Extender: [name] Weeks in Treatment: 14 Vital Signs Time Taken: 11:49 Temperature (F): 97.9 Height (in): 60 Pulse (bpm): 80 Weight (lbs): 88 Respiratory Rate (breaths/min): 16 Body Mass Index (BMI): 17.2 Blood Pressure (mmHg): 113/75 Reference Range: 80 - 120 mg / dl Electronic Signature(s) Signed: 02/13/2022 2:43:51 PM By: [name] EMT Entered By: [name] on 02/13/2022 14:43:51

## 2022-02-14 NOTE — Progress Notes (Signed)
JAMIRRA, CURNOW ([id number]) Visit Report for 02/13/2022 Problem List Details Patient Name: Date of Service: [name] 02/13/2022 1:00 PM Medical Record Number: [id number] Patient Account Number: [id number] Date of Birth/Sex: Treating RN: 11/01/1960 (61 y.o. 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 02/13/2022 Medical Record Number: [id number] Patient Account Number: [id number] Date of Birth/Sex: Treating RN: [birth year] (61 y.o. 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

## 2022-02-14 NOTE — Progress Notes (Signed)
[name], [name] ([id number]) Visit Report for 02/13/2022 HBO Details Patient Name: Date of Service: [name] 02/13/2022 1:00 PM Medical Record Number: [id number] Patient Account Number: [id number] Date of Birth/Sex: Treating RN: Jul 30, 1960 (61 y.o. 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, Jaw HBO Treatment Details Treatment Number: 62 Patient Type: Outpatient Chamber Type: Monoplace Chamber Serial #: [id number] Treatment Protocol: 2.5 ATA with 90 minutes oxygen, with two 5 minute air breaks Treatment Details Compression Rate Down: 2.0 psi / minute De-Compression Rate Up: 2.0 psi / minute A breaks and breathing ir Compress Tx Pressure periods Decompress Decompress Begins Reached (leave unused spaces Begins Ends blank) Chamber Pressure (ATA 1 2.5 2.5 2.5 2.5 2.5 - - 2.5 1 ) Clock Time (24 hr) 11:50 12:01 12:32 12:37 13:07 13:12 - - 13:42 13:50 Treatment Length: 120 (minutes) Treatment Segments: 4 Vital Signs Capillary Blood Glucose Reference Range: 80 - 120 mg / dl HBO Diabetic Blood Glucose Intervention Range: <131 mg/dl or >249 mg/dl Time Vitals Blood Respiratory Capillary Blood Glucose Pulse Action Type: Pulse: Temperature: Taken: Pressure: Rate: Glucose (mg/dl): Meter #: Oximetry (%) Taken: Pre 11:49 113/75 80 16 97.9 Post 13:56 129/71 59 16 98.3 Treatment Response Treatment Toleration: Well Treatment Completion Status: Treatment Completed without Adverse Event Treatment Notes The patient stated that she was a little "stuffy" today. I asked Dr. Celine Ahr to look in her ears. The patient  was given Afrin - 1 spray in both nostrils prior to HBO treatment for difficulty clearing ears. She had no problems clearing. Physician HBO Attestation: I certify that I supervised this HBO treatment in accordance with Medicare guidelines. A trained emergency response team is readily available per Yes hospital policies and procedures. Continue HBOT as ordered. Yes Electronic Signature(s) Signed: 02/13/2022 4:10:11 PM By: [name] MD FACS Previous Signature: 02/13/2022 2:49:37 PM Version By: [name] EMT Entered By: [name] on 02/13/2022 16:10:11 -------------------------------------------------------------------------------- HBO Safety Checklist Details Patient Name: Date of Service: [name] 02/13/2022 1:00 PM Medical Record Number: [id number] Patient Account Number: [id number] Date of Birth/Sex: Treating RN: [birth year] (61 y.o. [name] Primary Care Dovey Fatzinger: [name] Other Clinician: [name] Referring Nivea Wojdyla: Treating Kizzie Cotten/Extender: [name] Weeks in Treatment: 14 HBO Safety Checklist Items Safety Checklist Consent Form Signed Patient voided / foley secured and emptied When did you last eato 0930 Last dose of injectable or oral agent NA Ostomy pouch emptied and vented if applicable NA All implantable devices assessed, documented and approved NA Intravenous access site secured and place NA Valuables secured Linens and cotton and cotton/polyester blend (less than 51% polyester) Personal oil-based products / skin lotions / body lotions removed Wigs or hairpieces removed NA Smoking or tobacco materials removed Books / newspapers / magazines / loose paper removed Cologne, aftershave, perfume and deodorant removed Jewelry removed (may wrap wedding band) NA Make-up removed Hair care products removed Battery operated devices (external) removed Heating patches and chemical warmers  removed Titanium eyewear removed NA Nail polish cured greater than 10 hours Casting material cured greater than 10 hours NA Hearing aids removed NA Loose dentures or partials removed NA Prosthetics have been removed NA Patient demonstrates correct use of air  break device (if applicable) Patient concerns have been addressed Patient grounding bracelet on and cord attached to chamber Specifics for Inpatients (complete in addition to above) Medication sheet sent with patient NA Intravenous medications needed or due during therapy sent with patient NA Drainage tubes (e.g. nasogastric tube or chest tube secured and vented) NA Endotracheal or Tracheotomy tube secured NA Cuff deflated of air and inflated with saline NA Airway suctioned Notes The safety checklist was done before the treatment was started. Electronic Signature(s) Signed: 02/13/2022 2:45:09 PM By: [name] EMT Entered By: [name] on 02/13/2022 14:45:09

## 2022-02-14 NOTE — Progress Notes (Signed)
Theresa, Dillon ([id number]) Visit Report for 02/14/2022 Problem List Details Patient Name: Date of Service: [name] 02/14/2022 10:00 A M Medical Record Number: [id number] Patient Account Number: [id number] Date of Birth/Sex: Treating RN: 22-Apr-1961 (61 y.o. 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 02/14/2022 Medical Record Number: [id number] Patient Account Number: [id number] Date of Birth/Sex: Treating RN: 05-Sep-1960 (61 y.o. 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

## 2022-02-14 NOTE — Progress Notes (Signed)
CHELSA, STOUT ([id number]) Visit Report for 02/12/2022 Arrival Information Details Patient Name: Date of Service: [name] 02/12/2022 1:00 PM Medical Record Number: [id number] Patient Account Number: [id number] Date of Birth/Sex: Treating RN: 05/13/1961 (61 y.o. [name] Primary Care Zacharia Sowles: [name] Other Clinician: [name] Referring Alonnie Bieker: Treating Brewster Wolters/Extender: [name] in Treatment: 14 Visit Information History Since Last Visit All ordered tests and consults were completed: Yes Patient Arrived: Ambulatory Added or deleted any medications: No Arrival Time: 11:34 Any new allergies or adverse reactions: No Accompanied By: None Had a fall or experienced change in No Transfer Assistance: None activities of daily living that may affect Patient Identification Verified: Yes risk of falls: Patient Requires Transmission-Based Precautions: No Signs or symptoms of abuse/neglect since last visito No Patient Has Alerts: No Hospitalized since last visit: No Implantable device outside of the clinic excluding No cellular tissue based products placed in the center since last visit: Pain Present Now: No Electronic Signature(s) Signed: 02/12/2022 1:55:27 PM By: [name] EMT Entered By: [name] on 02/12/2022 13:55:27 -------------------------------------------------------------------------------- Encounter Discharge Information Details Patient Name: Date of Service: [name] 02/12/2022 1:00 PM Medical Record Number: [id number] Patient Account Number: [id number] Date of Birth/Sex: Treating RN: 08/18/1960 (61 y.o. [name] Primary Care Fatima Fedie: [name] Other Clinician: [name] Referring Karthikeya Funke: Treating Shontell Prosser/Extender: [name] in Treatment: 14 Encounter Discharge Information Items Discharge Condition:  Stable Ambulatory Status: Ambulatory Discharge Destination: Home Transportation: Private Auto Accompanied By: None Schedule Follow-up Appointment: Yes Clinical Summary of Care: Electronic Signature(s) Signed: 02/12/2022 2:21:50 PM By: [name] EMT Entered By: [name] on 02/12/2022 14:21:50 -------------------------------------------------------------------------------- Vitals Details Patient Name: Date of Service: [name] 02/12/2022 1:00 PM Medical Record Number: [id number] Patient Account Number: [id number] Date of Birth/Sex: Treating RN: Jun 26, 1961 (61 y.o. [name] Primary Care Davaughn Hillyard: [name] Other Clinician: [name] Referring Ilena Dieckman: Treating Aldahir Litaker/Extender: [name] Weeks in Treatment: 14 Vital Signs Time Taken: 11:51 Temperature (F): 98.4 Height (in): 60 Pulse (bpm): 72 Weight (lbs): 88 Respiratory Rate (breaths/min): 16 Body Mass Index (BMI): 17.2 Blood Pressure (mmHg): 111/75 Reference Range: 80 - 120 mg / dl Electronic Signature(s) Signed: 02/12/2022 1:55:52 PM By: [name] EMT Entered By: [name] on 02/12/2022 13:55:52

## 2022-02-14 NOTE — Progress Notes (Signed)
SALEM, MASTROGIOVANNI ([id number]) Visit Report for 02/14/2022 Arrival Information Details Patient Name: Date of Service: [name] [name] D. 02/14/2022 10:00 A M Medical Record Number: [id number] Patient Account Number: [id number] Date of Birth/Sex: Treating RN: June 10, 1961 (61 y.o. Donney Rankins, Lovena Le Primary Care Tykeisha Peer: [name] Other Clinician: [name] Referring Binh Doten: Treating Devra Stare/Extender: Mathews Argyle in Treatment: 14 Visit Information History Since Last Visit All ordered tests and consults were completed: Yes Patient Arrived: Ambulatory Added or deleted any medications: No Arrival Time: 09:41 Any new allergies or adverse reactions: No Accompanied By: friend Had a fall or experienced change in No Transfer Assistance: None activities of daily living that may affect Patient Identification Verified: Yes risk of falls: Secondary Verification Process Completed: Yes Signs or symptoms of abuse/neglect since last visito No Patient Requires Transmission-Based Precautions: No Hospitalized since last visit: No Patient Has Alerts: No Implantable device outside of the clinic excluding No cellular tissue based products placed in the center since last visit: Pain Present Now: No Electronic Signature(s) Signed: 02/14/2022 1:13:55 PM By: [name] EMT Entered By: [name] on 02/14/2022 13:13:55 -------------------------------------------------------------------------------- Encounter Discharge Information Details Patient Name: Date of Service: [name] [name] D. 02/14/2022 10:00 A M Medical Record Number: [id number] Patient Account Number: [id number] Date of Birth/Sex: Treating RN: [birth year] (61 y.o. [name] Primary Care Destin Vinsant: [name] Other Clinician: [name] Referring Alisea Matte: Treating Lenny Fiumara/Extender: Mathews Argyle in Treatment: 14 Encounter  Discharge Information Items Discharge Condition: Stable Ambulatory Status: Ambulatory Discharge Destination: Home Transportation: Private Auto Accompanied By: None Schedule Follow-up Appointment: Yes Clinical Summary of Care: Electronic Signature(s) Signed: 02/14/2022 1:19:10 PM By: [name] EMT Entered By: [name] on 02/14/2022 13:19:10 -------------------------------------------------------------------------------- Vitals Details Patient Name: Date of Service: [name] [name] D. 02/14/2022 10:00 A M Medical Record Number: [id number] Patient Account Number: [id number] Date of Birth/Sex: Treating RN: 1960/10/13 (61 y.o. [name] Primary Care Kaniyah Lisby: [name] Other Clinician: [name] Referring Arihant Pennings: Treating Stevenson Windmiller/Extender: [name] Weeks in Treatment: 14 Vital Signs Time Taken: 09:50 Temperature (F): 98.0 Height (in): 60 Pulse (bpm): 85 Weight (lbs): 88 Respiratory Rate (breaths/min): 16 Body Mass Index (BMI): 17.2 Blood Pressure (mmHg): 99/79 Reference Range: 80 - 120 mg / dl Electronic Signature(s) Signed: 02/14/2022 1:14:22 PM By: [name] EMT Entered By: [name] on 02/14/2022 13:14:22

## 2022-02-15 ENCOUNTER — Institutional Professional Consult (permissible substitution): Payer: Medicare Other | Admitting: Plastic Surgery

## 2022-02-15 ENCOUNTER — Encounter (HOSPITAL_BASED_OUTPATIENT_CLINIC_OR_DEPARTMENT_OTHER): Payer: Medicare Other | Admitting: General Surgery

## 2022-02-15 DIAGNOSIS — M8788 Other osteonecrosis, other site: Secondary | ICD-10-CM | POA: Diagnosis not present

## 2022-02-15 NOTE — Progress Notes (Signed)
SONI, KEGEL ([id number]) Visit Report for 02/11/2022 SuperBill Details Patient Name: Date of Service: [name] 02/11/2022 Medical Record Number: [id number] Patient Account Number: [id number] Date of Birth/Sex: Treating RN: Mar 01, 1961 (62 y.o. 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

## 2022-02-15 NOTE — Progress Notes (Signed)
Theresa, Dillon ([id number]) Visit Report for 02/11/2022 HBO Details Patient Name: Date of Service: [name] 02/11/2022 12:00 PM Medical Record Number: [id number] Patient Account Number: [id number] Date of Birth/Sex: Treating RN: 1961/05/10 (61 y.o. 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, Jaw HBO Treatment Details Treatment Number: 60 Patient Type: Outpatient Chamber Type: Monoplace Chamber Serial #: [id number] Treatment Protocol: 2.5 ATA with 90 minutes oxygen, with two 5 minute air breaks Treatment Details Compression Rate Down: 2.0 psi / minute De-Compression Rate Up: 2.0 psi / minute A breaks and breathing ir Compress Tx Pressure periods Decompress Decompress Begins Reached (leave unused spaces Begins Ends blank) Chamber Pressure (ATA 1 2.5 2.5 2.5 2.5 2.5 - - 2.5 1 ) Clock Time (24 hr) 12:29 12:40 13:10 13:15 13:45 13:50 - - 13:52 14:03 Treatment Length: 94 (minutes) Treatment Segments: 3 Vital Signs Capillary Blood Glucose Reference Range: 80 - 120 mg / dl HBO Diabetic Blood Glucose Intervention Range: <131 mg/dl or >249 mg/dl Type: Time Vitals Blood Respiratory Capillary Blood Glucose Pulse Action Pulse: Temperature: Taken: Pressure: Rate: Glucose (mg/dl): Meter #: Oximetry (%) Taken: Pre 12:21 124/86 90 18 98.6 none per protocol Post 14:06 147/81 65 18 97.9 none per protocol Treatment Response Treatment Toleration: Fair Adverse Events: 1:Nausea / Vomiting Treatment Completion Status: Treatment Aborted/Not Restarted Reason: Adverse Event [name] Notes Patient stated she felt  nauseous upon completion of 2nd air break. Contacted physician and treatment was ended. Patient stated that nausea was likely caused by oxycodone medication and that her physician is apparently ordering an alternative medication. Physician HBO Attestation: I certify that I supervised this HBO treatment in accordance with Medicare guidelines. A trained emergency response team is readily available per Yes hospital policies and procedures. Continue HBOT as ordered. Yes Electronic Signature(s) Signed: 02/11/2022 3:31:00 PM By: Theresa Maudlin MD FACS Previous Signature: 02/11/2022 3:10:35 PM Version By: Donavan Burnet CHT EMT BS , , Previous Signature: 02/11/2022 3:08:07 PM Version By: Donavan Burnet CHT EMT BS , , Previous Signature: 02/11/2022 3:06:59 PM Version By: Donavan Burnet CHT EMT BS , , Entered By: [name] on 02/11/2022 15:30:59 -------------------------------------------------------------------------------- HBO Safety Checklist Details Patient Name: Date of Service: [name] 02/11/2022 12:00 PM Medical Record Number: [id number] Patient Account Number: [id number] Date of Birth/Sex: Treating RN: 27-Sep-1960 (61 y.o. [name], [name] Primary Care [name]: [name] Other Clinician: Donavan Burnet Referring Mykael Batz: Treating [name]/Extender: [name] in Treatment: 14 HBO Safety Checklist Items Safety Checklist Consent Form Signed Patient voided / foley secured and emptied When did you last eato 1000 Last dose of injectable or oral agent n/a Ostomy pouch emptied and vented if applicable NA All implantable devices assessed, documented and approved NA Intravenous access site secured and place NA Valuables secured Linens and cotton and cotton/polyester blend (less than 51% polyester) Personal oil-based products / skin lotions / body lotions removed Wigs or hairpieces removed NA Smoking or tobacco  materials removed NA Books / newspapers / magazines / loose paper removed Cologne, aftershave, perfume and deodorant removed Jewelry removed (may wrap wedding band) Make-up removed Hair care products removed Battery operated devices (external) removed Heating patches and chemical warmers removed NA Titanium  eyewear removed NA Nail polish cured greater than 10 hours Casting material cured greater than 10 hours NA Hearing aids removed NA Loose dentures or partials removed NA Prosthetics have been removed NA Patient demonstrates correct use of air break device (if applicable) Patient concerns have been addressed Patient grounding bracelet on and cord attached to chamber Specifics for Inpatients (complete in addition to above) Medication sheet sent with patient NA Intravenous medications needed or due during therapy sent with patient NA Drainage tubes (e.g. nasogastric tube or chest tube secured and vented) NA Endotracheal or Tracheotomy tube secured NA Cuff deflated of air and inflated with saline NA Airway suctioned NA Notes Paper version used prior to treatment Electronic Signature(s) Signed: 02/11/2022 12:57:07 PM By: Donavan Burnet CHT EMT BS , , Entered By: Donavan Burnet on 02/11/2022 12:57:07

## 2022-02-15 NOTE — Progress Notes (Signed)
[name], [name] ([id number]) Visit Report for 02/15/2022 Arrival Information Details Patient Name: Date of Service: [name] 02/15/2022 10:00 A M Medical Record Number: [id number] Patient Account Number: [id number] Date of Birth/Sex: Treating RN: [birth year] (61 y.o. [name] Primary Care [name]: [name] Other Clinician: Valeria Dillon Referring [name]: Treating [name]/Extender: [name] in Treatment: 14 Visit Information History Since Last Visit All ordered tests and consults were completed: Yes Patient Arrived: Ambulatory Added or deleted any medications: No Arrival Time: 09:47 Any new allergies or adverse reactions: No Accompanied By: None Had a fall or experienced change in No Transfer Assistance: None activities of daily living that may affect Patient Identification Verified: Yes risk of falls: Secondary Verification Process Completed: Yes Signs or symptoms of abuse/neglect since last visito No Patient Requires Transmission-Based Precautions: No Hospitalized since last visit: No Patient Has Alerts: No Implantable device outside of the clinic excluding No cellular tissue based products placed in the center since last visit: Pain Present Now: No Electronic Signature(s) Signed: 02/15/2022 10:24:09 AM By: [name] EMT Entered By: [name] on 02/15/2022 10:24:09 -------------------------------------------------------------------------------- Encounter Discharge Information Details Patient Name: Date of Service: [name] 02/15/2022 10:00 A M Medical Record Number: [id number] Patient Account Number: [id number] Date of Birth/Sex: Treating RN: 1960-09-01 (61 y.o. [name] Primary Care [name]: [name] Other Clinician: Valeria Dillon Referring Jassen Sarver: Treating [name]/Extender: [name] in Treatment: 14 Encounter Discharge  Information Items Discharge Condition: Stable Ambulatory Status: Ambulatory Discharge Destination: Home Transportation: Private Auto Accompanied By: None Schedule Follow-up Appointment: Yes Clinical Summary of Care: Electronic Signature(s) Signed: 02/15/2022 12:28:26 PM By: [name] EMT Entered By: [name] on 02/15/2022 12:28:26 -------------------------------------------------------------------------------- Vitals Details Patient Name: Date of Service: [name] 02/15/2022 10:00 A M Medical Record Number: [id number] Patient Account Number: [id number] Date of Birth/Sex: Treating RN: 12-Nov-1960 (61 y.o. [name] Primary Care [name]: [name] Other Clinician: Valeria Dillon Referring Xayvier Vallez: Treating [name]/Extender: [name] in Treatment: 14 Vital Signs Time Taken: 10:05 Temperature (F): 98.1 Height (in): 60 Pulse (bpm): 75 Weight (lbs): 88 Respiratory Rate (breaths/min): 16 Body Mass Index (BMI): 17.2 Blood Pressure (mmHg): 107/87 Reference Range: 80 - 120 mg / dl Electronic Signature(s) Signed: 02/15/2022 10:24:36 AM By: [name] EMT Entered By: [name] on 02/15/2022 10:24:36

## 2022-02-15 NOTE — Progress Notes (Signed)
[name] ([id number]) Visit Report for 02/15/2022 HBO Details Patient Name: Date of Service: [name] 02/15/2022 10:00 A M Medical Record Number: [id number] Patient Account Number: [id number] Date of Birth/Sex: Treating RN: 03-Oct-1960 (61 y.o. 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, Jaw HBO Treatment Details Treatment Number: 64 Patient Type: Outpatient Chamber Type: Monoplace Chamber Serial #: [id number] Treatment Protocol: 2.5 ATA with 90 minutes oxygen, with two 5 minute air breaks Treatment Details Compression Rate Down: 2.0 psi / minute De-Compression Rate Up: A breaks and breathing ir Compress Tx Pressure periods Decompress Decompress Begins Reached (leave unused spaces Begins Ends blank) Chamber Pressure (ATA 1 2.5 2.5 2.5 2.5 2.5 - - 2.5 1 ) Clock Time (24 hr) 10:14 10:30 11:00 11:05 11:35 11:40 - - 12:10 12:20 Treatment Length: 126 (minutes) Treatment Segments: 4 Vital Signs Capillary Blood Glucose Reference Range: 80 - 120 mg / dl HBO Diabetic Blood Glucose Intervention Range: <131 mg/dl or >249 mg/dl Time Vitals Blood Respiratory Capillary Blood Glucose Pulse Action Type: Pulse: Temperature: Taken: Pressure: Rate: Glucose (mg/dl): Meter #: Oximetry (%) Taken: Pre 10:05 107/87 75 16 98.1 Post 12:23 116/88 63 16 98 Treatment Response Treatment Toleration: Well Treatment Completion Status: Treatment Completed without Adverse Event Physician HBO Attestation: I certify that I supervised this HBO treatment in accordance with Medicare guidelines. A trained emergency response  team is readily available per Yes hospital policies and procedures. Continue HBOT as ordered. Yes Electronic Signature(s) Signed: 02/15/2022 12:37:33 PM By: Theresa Maudlin MD FACS Previous Signature: 02/15/2022 12:26:41 PM Version By: [name] EMT Entered By: [name] on 02/15/2022 12:37:32 -------------------------------------------------------------------------------- HBO Safety Checklist Details Patient Name: Date of Service: [name] 02/15/2022 10:00 A M Medical Record Number: [id number] Patient Account Number: [id number] Date of Birth/Sex: Treating RN: Sep 17, 1960 (61 y.o. [name] Primary Care [name]: [name] Other Clinician: [name] Referring [name]: Treating [name]/Extender: [name] Weeks in Treatment: 14 HBO Safety Checklist Items Safety Checklist Consent Form Signed Patient voided / foley secured and emptied When did you last eato 0830 Last dose of injectable or oral agent NA Ostomy pouch emptied and vented if applicable NA All implantable devices assessed, documented and approved NA Intravenous access site secured and place NA Valuables secured Linens and cotton and cotton/polyester blend (less than 51% polyester) Personal oil-based products / skin lotions / body lotions removed Wigs or hairpieces removed NA Smoking or tobacco materials removed Books / newspapers / magazines / loose paper removed Cologne, aftershave, perfume and deodorant removed Jewelry removed (may wrap wedding band) NA Make-up removed Hair care products removed Battery operated devices (external) removed Heating patches and chemical warmers removed Titanium eyewear removed NA Nail polish cured greater than 10 hours Casting material cured greater than 10 hours NA Hearing aids removed NA Loose dentures or partials removed NA Prosthetics have been removed NA Patient demonstrates correct use of air  break device (if applicable) Patient concerns have been addressed Patient grounding bracelet on and cord attached to chamber Specifics for Inpatients (complete in addition to above) Medication sheet sent with patient NA Intravenous medications needed or due during therapy sent with patient NA Drainage tubes (e.g. nasogastric  tube or chest tube secured and vented) NA Endotracheal or Tracheotomy tube secured NA Cuff deflated of air and inflated with saline NA Airway suctioned NA Notes The safety checklist was done before the treatment was started. Electronic Signature(s) Signed: 02/15/2022 10:27:11 AM By: [name] EMT Entered By: [name] on 02/15/2022 10:27:11

## 2022-02-15 NOTE — Progress Notes (Signed)
Theresa, Dillon ([id number]) Visit Report for 02/11/2022 Arrival Information Details Patient Name: Date of Service: Theresa LLA Theresa Alexander D. 02/11/2022 12:00 PM Medical Record Number: [id number] Patient Account Number: [id number] Date of Birth/Sex: Treating RN: 03-01-1961 (61 y.o. [name] Primary Care Nikkol Pai: [name] Other Clinician: Donavan Burnet Referring Cortney Beissel: Treating Zayan Delvecchio/Extender: Mathews Argyle in Treatment: 14 Visit Information History Since Last Visit All ordered tests and consults were completed: Yes Patient Arrived: Ambulatory Added or deleted any medications: No Arrival Time: 12:00 Any new allergies or adverse reactions: No Accompanied By: self Had a fall or experienced change in No Transfer Assistance: None activities of daily living that may affect Patient Identification Verified: Yes risk of falls: Secondary Verification Process Completed: Yes Signs or symptoms of abuse/neglect since last visito No Patient Requires Transmission-Based Precautions: No Hospitalized since last visit: No Patient Has Alerts: No Implantable device outside of the clinic excluding No cellular tissue based products placed in the center since last visit: Pain Present Now: No Electronic Signature(s) Signed: 02/11/2022 12:55:09 PM By: Donavan Burnet CHT EMT BS , , Entered By: Donavan Burnet on 02/11/2022 12:55:08 -------------------------------------------------------------------------------- Encounter Discharge Information Details Patient Name: Date of Service: Theresa LLA HA Theresa Bienenstock D. 02/11/2022 12:00 PM Medical Record Number: [id number] Patient Account Number: [id number] Date of Birth/Sex: Treating RN: 1961-04-21 (61 y.o. [name] Primary Care Kaijah Abts: [name] Other Clinician: Donavan Burnet Referring Mallery Harshman: Treating Kingsly Kloepfer/Extender: Mathews Argyle in Treatment:  14 Encounter Discharge Information Items Discharge Condition: Stable Ambulatory Status: Ambulatory Discharge Destination: Home Transportation: Private Auto Accompanied By: self Schedule Follow-up Appointment: No Clinical Summary of Care: Electronic Signature(s) Signed: 02/11/2022 3:11:23 PM By: Donavan Burnet CHT EMT BS , , Entered By: Donavan Burnet on 02/11/2022 15:11:23 -------------------------------------------------------------------------------- Vitals Details Patient Name: Date of Service: Theresa LLA Theresa Alexander D. 02/11/2022 12:00 PM Medical Record Number: [id number] Patient Account Number: [id number] Date of Birth/Sex: Treating RN: 1960-08-22 (61 y.o. [name] Primary Care Adella Manolis: [name] Other Clinician: Donavan Burnet Referring Ranyah Groeneveld: Treating Aziza Stuckert/Extender: Mathews Argyle in Treatment: 14 Vital Signs Time Taken: 12:21 Temperature (F): 98.6 Height (in): 60 Pulse (bpm): 90 Weight (lbs): 88 Respiratory Rate (breaths/min): 18 Body Mass Index (BMI): 17.2 Blood Pressure (mmHg): 124/86 Reference Range: 80 - 120 mg / dl Electronic Signature(s) Signed: 02/11/2022 12:55:30 PM By: Donavan Burnet CHT EMT BS , , Entered By: Donavan Burnet on 02/11/2022 12:55:30

## 2022-02-15 NOTE — Progress Notes (Signed)
TAYGEN, NEWSOME ([id number]) Visit Report for 02/15/2022 Problem List Details Patient Name: Date of Service: Theresa LLA [name] 02/15/2022 10:00 A M Medical Record Number: [id number] Patient Account Number: [id number] Date of Birth/Sex: Treating RN: 01/04/1961 (61 y.o. 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 02/15/2022 Medical Record Number: [id number] Patient Account Number: [id number] Date of Birth/Sex: Treating RN: Nov 19, 1960 (61 y.o. 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

## 2022-02-18 ENCOUNTER — Encounter (HOSPITAL_BASED_OUTPATIENT_CLINIC_OR_DEPARTMENT_OTHER): Payer: Medicare Other | Admitting: Internal Medicine

## 2022-02-18 DIAGNOSIS — C411 Malignant neoplasm of mandible: Secondary | ICD-10-CM | POA: Diagnosis not present

## 2022-02-18 DIAGNOSIS — Z923 Personal history of irradiation: Secondary | ICD-10-CM | POA: Diagnosis not present

## 2022-02-18 DIAGNOSIS — M8788 Other osteonecrosis, other site: Secondary | ICD-10-CM | POA: Diagnosis not present

## 2022-02-18 DIAGNOSIS — M272 Inflammatory conditions of jaws: Secondary | ICD-10-CM

## 2022-02-18 NOTE — Progress Notes (Signed)
[name], [name] ([id number]) Visit Report for 02/18/2022 SuperBill Details Patient Name: Date of Service: [name] 02/18/2022 Medical Record Number: [id number] Patient Account Number: [id number] Date of Birth/Sex: Treating RN: December 08, 1960 (61 y.o. 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

## 2022-02-18 NOTE — Progress Notes (Addendum)
[name], [name] ([id number]) Visit Report for 02/18/2022 Arrival Information Details Patient Name: Date of Service: [name] 02/18/2022 1:00 PM Medical Record Number: [id number] Patient Account Number: [id number] Date of Birth/Sex: Treating RN: September 14, 1960 (61 y.o. [name], [name] Primary Care Windle Huebert: [name] Other Clinician: Donavan Burnet Referring Chauncey Sciulli: Treating Lurie Mullane/Extender: [name] in Treatment: 15 Visit Information History Since Last Visit All ordered tests and consults were completed: No Patient Arrived: Ambulatory Added or deleted any medications: No Arrival Time: 12:09 Any new allergies or adverse reactions: No Accompanied By: self Had a fall or experienced change in No Transfer Assistance: None activities of daily living that may affect Patient Identification Verified: Yes risk of falls: Secondary Verification Process Completed: Yes Signs or symptoms of abuse/neglect since last visito No Patient Requires Transmission-Based Precautions: No Hospitalized since last visit: No Patient Has Alerts: No Implantable device outside of the clinic excluding No cellular tissue based products placed in the center since last visit: Pain Present Now: No Electronic Signature(s) Signed: 02/18/2022 2:14:56 PM By: Donavan Burnet CHT EMT BS , , Entered By: Donavan Burnet on 02/18/2022 14:14:56 -------------------------------------------------------------------------------- Encounter Discharge Information Details Patient Name: Date of Service: [name] 02/18/2022 1:00 PM Medical Record Number: [id number] Patient Account Number: [id number] Date of Birth/Sex: Treating RN: 09-13-1960 (61 y.o. [name] Primary Care Gaberiel Youngblood: [name] Other Clinician: Donavan Burnet Referring Viviane Semidey: Treating Ayeshia Coppin/Extender: [name] in Treatment: 15 Encounter  Discharge Information Items Discharge Condition: Stable Ambulatory Status: Ambulatory Discharge Destination: Home Transportation: Private Auto Accompanied By: self Schedule Follow-up Appointment: No Clinical Summary of Care: Electronic Signature(s) Signed: 02/18/2022 3:43:25 PM By: Donavan Burnet CHT EMT BS , , Entered By: Donavan Burnet on 02/18/2022 15:43:25 -------------------------------------------------------------------------------- Vitals Details Patient Name: Date of Service: [name] 02/18/2022 1:00 PM Medical Record Number: [id number] Patient Account Number: [id number] Date of Birth/Sex: Treating RN: [birth year] (61 y.o. [name], [name] Primary Care [name]: [name] Other Clinician: Donavan Burnet Referring Cleland Simkins: Treating Huston Stonehocker/Extender: [name] Weeks in Treatment: 15 Vital Signs Time Taken: 12:18 Temperature (F): 98.8 Height (in): 60 Pulse (bpm): 104 Weight (lbs): 88 Respiratory Rate (breaths/min): 14 Body Mass Index (BMI): 17.2 Blood Pressure (mmHg): 112/82 Reference Range: 80 - 120 mg / dl Airway Pulse Oximetry (%): 100 Electronic Signature(s) Signed: 02/18/2022 2:15:28 PM By: Donavan Burnet CHT EMT BS , , Entered By: Donavan Burnet on 02/18/2022 14:15:28

## 2022-02-18 NOTE — Progress Notes (Addendum)
CATHY, CROUNSE ([id number]) Visit Report for 02/18/2022 HBO Details Patient Name: Date of Service: [name] 02/18/2022 1:00 PM Medical Record Number: [id number] Patient Account Number: [id number] Date of Birth/Sex: Treating RN: 1961-02-05 (61 y.o. 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, Jaw HBO Treatment Details Treatment Number: 65 Patient Type: Outpatient Chamber Type: Monoplace Chamber Serial #: [id number] Treatment Protocol: 2.5 ATA with 90 minutes oxygen, with two 5 minute air breaks Treatment Details Compression Rate Down: 2.0 psi / minute De-Compression Rate Up: 2.0 psi / minute A breaks and breathing ir Compress Tx Pressure periods Decompress Decompress Begins Reached (leave unused spaces Begins Ends blank) Chamber Pressure (ATA 1 2.5 2.5 2.5 2.5 2.5 - - 2.5 1 ) Clock Time (24 hr) 12:31 12:46 13:16 13:21 13:51 13:56 - - 14:26 14:37 Treatment Length: 126 (minutes) Treatment Segments: 4 Vital Signs Capillary Blood Glucose Reference Range: 80 - 120 mg / dl HBO Diabetic Blood Glucose Intervention Range: <131 mg/dl or >249 mg/dl Type: Time Vitals Blood Respiratory Capillary Blood Glucose Pulse Action Pulse: Temperature: Taken: Pressure: Rate: Glucose (mg/dl): Meter #: Oximetry (%) Taken: Pre 12:18 112/82 104 14 98.8 100 none per protocol Post 14:40 103/67 72 18 97.9 none per protocol Treatment Response Treatment Toleration: Well Treatment Completion Status: Treatment Completed without Adverse Event Electronic Signature(s) Signed: 02/18/2022 3:42:10 PM By: Donavan Burnet CHT EMT BS ,  , Signed: 02/18/2022 5:07:45 PM By: [name] DO Entered By: Donavan Burnet on 02/18/2022 15:42:09 -------------------------------------------------------------------------------- HBO Safety Checklist Details Patient Name: Date of Service: [name] 02/18/2022 1:00 PM Medical Record Number: [id number] Patient Account Number: [id number] Date of Birth/Sex: Treating RN: [birth year] (61 y.o. [name], [name] Primary Care Jalani Rominger: [name] Other Clinician: Donavan Burnet Referring Jerico Grisso: Treating Durrell Barajas/Extender: [name] Weeks in Treatment: 15 HBO Safety Checklist Items Safety Checklist Consent Form Signed Patient voided / foley secured and emptied When did you last eato 1030 Last dose of injectable or oral agent n/a Ostomy pouch emptied and vented if applicable NA All implantable devices assessed, documented and approved NA Intravenous access site secured and place NA Valuables secured Linens and cotton and cotton/polyester blend (less than 51% polyester) Personal oil-based products / skin lotions / body lotions removed Wigs or hairpieces removed NA Smoking or tobacco materials removed NA Books / newspapers / magazines / loose paper removed Cologne, aftershave, perfume and deodorant removed Jewelry removed (may wrap wedding band) Make-up removed Hair care products removed Battery operated devices (external) removed Heating patches and chemical warmers removed Titanium eyewear removed NA Nail polish cured greater than 10 hours Casting material cured greater than 10 hours NA Hearing aids removed NA Loose dentures or partials removed NA Prosthetics have been removed NA Patient demonstrates correct use of air break device (if applicable) Patient concerns have been addressed Patient grounding bracelet on and cord attached to chamber Specifics for Inpatients (complete in addition to above) Medication sheet  sent with patient NA Intravenous medications needed or due during therapy sent with patient NA Drainage tubes (e.g. nasogastric tube or chest tube secured and vented) NA Endotracheal or Tracheotomy tube secured NA Cuff deflated of air and inflated with saline NA Airway suctioned  NA Notes Paper version used prior to treatment start. Electronic Signature(s) Signed: 02/18/2022 2:16:31 PM By: Donavan Burnet CHT EMT BS , , Entered By: Donavan Burnet on 02/18/2022 14:16:31

## 2022-02-19 ENCOUNTER — Encounter (HOSPITAL_BASED_OUTPATIENT_CLINIC_OR_DEPARTMENT_OTHER): Payer: Medicare Other | Admitting: Internal Medicine

## 2022-02-19 DIAGNOSIS — C411 Malignant neoplasm of mandible: Secondary | ICD-10-CM

## 2022-02-19 DIAGNOSIS — Z923 Personal history of irradiation: Secondary | ICD-10-CM

## 2022-02-19 DIAGNOSIS — M272 Inflammatory conditions of jaws: Secondary | ICD-10-CM | POA: Diagnosis not present

## 2022-02-19 DIAGNOSIS — M8788 Other osteonecrosis, other site: Secondary | ICD-10-CM | POA: Diagnosis not present

## 2022-02-19 NOTE — Progress Notes (Addendum)
JULIANI, LADUKE ([id number]) Visit Report for 02/19/2022 Arrival Information Details Patient Name: Date of Service: [name] 02/19/2022 1:00 PM Medical Record Number: [id number] Patient Account Number: [id number] Date of Birth/Sex: Treating RN: 04-06-1961 (61 y.o. [name], [name] Primary Care Lunna Vogelgesang: [name] Other Clinician: Donavan Burnet Referring Elanore Talcott: Treating Everest Hacking/Extender: [name] in Treatment: 15 Visit Information History Since Last Visit All ordered tests and consults were completed: Yes Patient Arrived: Ambulatory Added or deleted any medications: No Arrival Time: 11:58 Any new allergies or adverse reactions: No Accompanied By: self Had a fall or experienced change in No Transfer Assistance: None activities of daily living that may affect Patient Identification Verified: Yes risk of falls: Secondary Verification Process Completed: Yes Signs or symptoms of abuse/neglect since last visito No Patient Requires Transmission-Based Precautions: No Hospitalized since last visit: No Patient Has Alerts: No Implantable device outside of the clinic excluding No cellular tissue based products placed in the center since last visit: Pain Present Now: No Electronic Signature(s) Signed: 02/19/2022 1:34:36 PM By: Donavan Burnet CHT EMT BS , , Entered By: Donavan Burnet on 02/19/2022 13:34:36 -------------------------------------------------------------------------------- Encounter Discharge Information Details Patient Name: Date of Service: [name] 02/19/2022 1:00 PM Medical Record Number: [id number] Patient Account Number: [id number] Date of Birth/Sex: Treating RN: 1961-01-21 (61 y.o. [name], [name] Primary Care Quinlan Vollmer: [name] Other Clinician: Donavan Burnet Referring Jerron Niblack: Treating Keslyn Teater/Extender: [name] in Treatment:  15 Encounter Discharge Information Items Discharge Condition: Stable Ambulatory Status: Ambulatory Discharge Destination: Home Transportation: Private Auto Accompanied By: self Schedule Follow-up Appointment: No Clinical Summary of Care: Electronic Signature(s) Signed: 02/19/2022 3:53:06 PM By: Donavan Burnet CHT EMT BS , , Entered By: Donavan Burnet on 02/19/2022 15:53:06 -------------------------------------------------------------------------------- Vitals Details Patient Name: Date of Service: [name] 02/19/2022 1:00 PM Medical Record Number: [id number] Patient Account Number: [id number] Date of Birth/Sex: Treating RN: Apr 24, 1961 (61 y.o. [name], [name] Primary Care Shamika Pedregon: [name] Other Clinician: Donavan Burnet Referring Marqueta Pulley: Treating Tamani Durney/Extender: [name] Weeks in Treatment: 15 Vital Signs Time Taken: 12:10 Temperature (F): 98.2 Height (in): 60 Pulse (bpm): 93 Weight (lbs): 88 Respiratory Rate (breaths/min): 16 Body Mass Index (BMI): 17.2 Blood Pressure (mmHg): 111/79 Reference Range: 80 - 120 mg / dl Electronic Signature(s) Signed: 02/19/2022 1:35:41 PM By: Donavan Burnet CHT EMT BS , , Entered By: Donavan Burnet on 02/19/2022 13:35:41

## 2022-02-19 NOTE — Progress Notes (Signed)
[name], [name] ([id number]) Visit Report for 02/19/2022 HBO Safety Checklist Details Patient Name: Date of Service: [name] 02/19/2022 1:00 PM Medical Record Number: [id number] Patient Account Number: [id number] Date of Birth/Sex: Treating RN: 1961/01/19 (61 y.o. [name], [name] Primary Care Ares Cardozo: [name] Other Clinician: Donavan Burnet Referring Lilyanah Celestin: Treating Dalaysia Harms/Extender: [name] Weeks in Treatment: 15 HBO Safety Checklist Items Safety Checklist Consent Form Signed Patient voided / foley secured and emptied When did you last eato 1030 Last dose of injectable or oral agent n/a Ostomy pouch emptied and vented if applicable NA All implantable devices assessed, documented and approved NA Intravenous access site secured and place NA Valuables secured Linens and cotton and cotton/polyester blend (less than 51% polyester) Personal oil-based products / skin lotions / body lotions removed Wigs or hairpieces removed NA Smoking or tobacco materials removed NA Books / newspapers / magazines / loose paper removed Cologne, aftershave, perfume and deodorant removed Jewelry removed (may wrap wedding band) Make-up removed Hair care products removed Battery operated devices (external) removed Heating patches and chemical warmers removed Titanium eyewear removed NA Nail polish cured greater than 10 hours Casting material cured greater than 10 hours NA Hearing aids removed NA Loose dentures or partials removed NA Prosthetics have been removed NA Patient demonstrates correct use of air break device (if applicable) Patient concerns have been addressed Patient grounding bracelet on and cord attached to chamber Specifics for Inpatients (complete in addition to above) Medication sheet sent with patient NA Intravenous medications needed or due during therapy sent with patient NA Drainage tubes (e.g.  nasogastric tube or chest tube secured and vented) NA Endotracheal or Tracheotomy tube secured NA Cuff deflated of air and inflated with saline NA Airway suctioned NA Notes Paper version used prior to treatment start. Electronic Signature(s) Signed: 02/19/2022 1:36:52 PM By: Donavan Burnet CHT EMT BS , , Entered By: Donavan Burnet on 02/19/2022 13:36:52

## 2022-02-20 ENCOUNTER — Encounter (HOSPITAL_BASED_OUTPATIENT_CLINIC_OR_DEPARTMENT_OTHER): Payer: Medicare Other | Admitting: General Surgery

## 2022-02-20 DIAGNOSIS — M8788 Other osteonecrosis, other site: Secondary | ICD-10-CM | POA: Diagnosis not present

## 2022-02-20 NOTE — Progress Notes (Signed)
[name], [name] ([id number]) Visit Report for 02/20/2022 Arrival Information Details Patient Name: Date of Service: Theresa LLA Theresa Alexander D. 02/20/2022 1:00 PM Medical Record Number: [id number] Patient Account Number: [id number] Date of Birth/Sex: Treating RN: 09-05-1960 (61 y.o. [name] Primary Care Rayden Dock: [name] Other Clinician: Donavan Burnet Referring Celestial Barnfield: Treating Cobe Viney/Extender: Mathews Argyle in Treatment: 15 Visit Information History Since Last Visit All ordered tests and consults were completed: Yes Patient Arrived: Ambulatory Added or deleted any medications: No Arrival Time: 12:01 Any new allergies or adverse reactions: No Accompanied By: self Had a fall or experienced change in No Transfer Assistance: None activities of daily living that may affect Patient Identification Verified: Yes risk of falls: Secondary Verification Process Completed: Yes Signs or symptoms of abuse/neglect since last visito No Patient Requires Transmission-Based Precautions: No Hospitalized since last visit: No Patient Has Alerts: No Implantable device outside of the clinic excluding No cellular tissue based products placed in the center since last visit: Pain Present Now: No Electronic Signature(s) Signed: 02/20/2022 3:56:05 PM By: Donavan Burnet CHT EMT BS , , Entered By: Donavan Burnet on 02/20/2022 15:56:05 -------------------------------------------------------------------------------- Encounter Discharge Information Details Patient Name: Date of Service: Theresa [name] 02/20/2022 1:00 PM Medical Record Number: [id number] Patient Account Number: [id number] Date of Birth/Sex: Treating RN: [birth year] (61 y.o. [name] Primary Care Dereck Agerton: [name] Other Clinician: Donavan Burnet Referring Keyleen Cerrato: Treating Kirrah Mustin/Extender: Mathews Argyle in Treatment: 15 Encounter  Discharge Information Items Discharge Condition: Stable Ambulatory Status: Ambulatory Discharge Destination: Home Transportation: Private Auto Accompanied By: self Schedule Follow-up Appointment: No Clinical Summary of Care: Electronic Signature(s) Signed: 02/20/2022 4:12:26 PM By: Donavan Burnet CHT EMT BS , , Entered By: Donavan Burnet on 02/20/2022 16:12:26 -------------------------------------------------------------------------------- Vitals Details Patient Name: Date of Service: Theresa LLA Theresa Alexander D. 02/20/2022 1:00 PM Medical Record Number: [id number] Patient Account Number: [id number] Date of Birth/Sex: Treating RN: [name] Primary Care Corena Tilson: [name] Other Clinician: Donavan Burnet Referring Zakkary Thibault: Treating Daurice Ovando/Extender: Mathews Argyle in Treatment: 15 Vital Signs Time Taken: 12:15 Temperature (F): 98.9 Height (in): 60 Pulse (bpm): 92 Weight (lbs): 88 Respiratory Rate (breaths/min): 18 Body Mass Index (BMI): 17.2 Blood Pressure (mmHg): 111/99 Reference Range: 80 - 120 mg / dl Airway Pulse Oximetry (%): 100 Electronic Signature(s) Signed: 02/20/2022 3:56:33 PM By: Donavan Burnet CHT EMT BS , , Entered By: Donavan Burnet on 02/20/2022 15:56:33

## 2022-02-20 NOTE — Progress Notes (Addendum)
ADELY, FACER ([id number]) Visit Report for 02/20/2022 HBO Details Patient Name: Date of Service: [name] 02/20/2022 1:00 PM Medical Record Number: [id number] Patient Account Number: [id number] Date of Birth/Sex: Treating RN: 05-31-1961 (61 y.o. 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, Jaw HBO Treatment Details Treatment Number: 67 Patient Type: Outpatient Chamber Type: Monoplace Chamber Serial #: [id number] Treatment Protocol: 2.5 ATA with 90 minutes oxygen, with two 5 minute air breaks Treatment Details Compression Rate Down: 2.0 psi / minute De-Compression Rate Up: 2.0 psi / minute A breaks and breathing ir Compress Tx Pressure periods Decompress Decompress Begins Reached (leave unused spaces Begins Ends blank) Chamber Pressure (ATA 1 2.5 2.5 2.5 2.5 2.5 - - 2.5 1 ) Clock Time (24 hr) 12:38 12:49 13:19 13:24 13:54 13:59 - - 14:29 14:40 Treatment Length: 122 (minutes) Treatment Segments: 4 Vital Signs Capillary Blood Glucose Reference Range: 80 - 120 mg / dl HBO Diabetic Blood Glucose Intervention Range: <131 mg/dl or >249 mg/dl Type: Time Vitals Blood Respiratory Capillary Blood Glucose Pulse Action Pulse: Temperature: Taken: Pressure: Rate: Glucose (mg/dl): Meter #: Oximetry (%) Taken: Pre 12:15 111/99 92 18 98.9 100 none per protocol Post 14:45 113/68 72 18 98.4 none per protocol Treatment Response Treatment Toleration: Well Treatment Completion Status: Treatment Completed without Adverse Event Treatment Notes Patient stated that she had pain prior to treatment which diminished  during treatment and started to return post-treatment. Informed physician. Physician HBO Attestation: I certify that I supervised this HBO treatment in accordance with Medicare guidelines. A trained emergency response team is readily available per Yes hospital policies and procedures. Continue HBOT as ordered. Yes Electronic Signature(s) Signed: 02/20/2022 4:58:55 PM By: [name] MD FACS Previous Signature: 02/20/2022 4:11:34 PM Version By: Donavan Burnet CHT EMT BS , , Previous Signature: 02/20/2022 4:11:11 PM Version By: Donavan Burnet CHT EMT BS , , Entered By: [name] on 02/20/2022 16:58:54 -------------------------------------------------------------------------------- HBO Safety Checklist Details Patient Name: Date of Service: [name] 02/20/2022 1:00 PM Medical Record Number: [id number] Patient Account Number: [id number] Date of Birth/Sex: Treating RN: Aug 27, 1960 (61 y.o. Iver Nestle, [place] Primary Care Vaanya Shambaugh: [name] Other Clinician: Donavan Burnet Referring Bee Hammerschmidt: Treating Tyshauna Finkbiner/Extender: [name] Weeks in Treatment: 15 HBO Safety Checklist Items Safety Checklist Consent Form Signed Patient voided / foley secured and emptied When did you last eato 1030 Last dose of injectable or oral agent n/a Ostomy pouch emptied and vented if applicable NA All implantable devices assessed, documented and approved NA Intravenous access site secured and place NA Valuables secured Linens and cotton and cotton/polyester blend (less than 51% polyester) Personal oil-based products / skin lotions / body lotions removed Wigs or hairpieces removed NA Smoking or tobacco materials removed NA Books / newspapers / magazines / loose paper removed Cologne, aftershave, perfume and deodorant removed Jewelry removed (may wrap wedding band) Make-up removed Hair care products removed Battery operated devices  (external) removed Heating patches and chemical warmers removed Titanium eyewear removed NA Nail polish cured greater than 10 hours Casting material cured greater than 10 hours NA Hearing aids removed NA Loose dentures or partials removed NA Prosthetics have been removed NA Patient demonstrates correct use  of air break device (if applicable) Patient concerns have been addressed Patient grounding bracelet on and cord attached to chamber Specifics for Inpatients (complete in addition to above) Medication sheet sent with patient NA Intravenous medications needed or due during therapy sent with patient NA Drainage tubes (e.g. nasogastric tube or chest tube secured and vented) NA Endotracheal or Tracheotomy tube secured NA Cuff deflated of air and inflated with saline NA Airway suctioned NA Notes Paper version used prior to treatment. Electronic Signature(s) Signed: 02/20/2022 3:57:48 PM By: Donavan Burnet CHT EMT BS , , Entered By: Donavan Burnet on 02/20/2022 15:57:47

## 2022-02-20 NOTE — Progress Notes (Signed)
DEMECIA, NORTHWAY ([id number]) Visit Report for 02/20/2022 SuperBill Details Patient Name: Date of Service: [name] 02/20/2022 Medical Record Number: [id number] Patient Account Number: [id number] Date of Birth/Sex: Treating RN: May 05, 1961 (61 y.o. 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

## 2022-02-21 ENCOUNTER — Encounter (HOSPITAL_BASED_OUTPATIENT_CLINIC_OR_DEPARTMENT_OTHER): Payer: Medicare Other | Admitting: General Surgery

## 2022-02-21 DIAGNOSIS — M8788 Other osteonecrosis, other site: Secondary | ICD-10-CM | POA: Diagnosis not present

## 2022-02-21 NOTE — Progress Notes (Addendum)
[name], [name] ([id number]) Visit Report for 02/21/2022 HBO Details Patient Name: Date of Service: Theresa LLA Theresa Alexander D. 02/21/2022 11:45 A M Medical Record Number: [id number] Patient Account Number: [id number] Date of Birth/Sex: Treating RN: 10/24/1960 (61 y.o. 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, Jaw HBO Treatment Details Treatment Number: 68 Patient Type: Outpatient Chamber Type: Monoplace Chamber Serial #: [id number] Treatment Protocol: 2.5 ATA with 90 minutes oxygen, with two 5 minute air breaks Treatment Details Compression Rate Down: 2.0 psi / minute De-Compression Rate Up: 2.0 psi / minute A breaks and breathing ir Compress Tx Pressure periods Decompress Decompress Begins Reached (leave unused spaces Begins Ends blank) Chamber Pressure (ATA 1 2.5 2.5 2.5 2.5 2.5 - - 2.5 1 ) Clock Time (24 hr) 12:01 12:13 12:43 12:48 13:18 13:23 - - 13:54 14:05 Treatment Length: 124 (minutes) Treatment Segments: 4 Vital Signs Capillary Blood Glucose Reference Range: 80 - 120 mg / dl HBO Diabetic Blood Glucose Intervention Range: <131 mg/dl or >249 mg/dl Time Vitals Blood Respiratory Capillary Blood Glucose Pulse Action Type: Pulse: Temperature: Taken: Pressure: Rate: Glucose (mg/dl): Meter #: Oximetry (%) Taken: Pre 11:53 110/77 90 16 98.2 Post 14:08 99/72 78 16 98.2 Treatment Response Treatment Toleration: Well Treatment Completion Status: Treatment Completed without Adverse Event Treatment Notes The patient is talking to her Doctor in [facility] about pain control. Physician HBO Attestation: I certify  that I supervised this HBO treatment in accordance with Medicare guidelines. A trained emergency response team is readily available per Yes hospital policies and procedures. Continue HBOT as ordered. Yes Electronic Signature(s) Signed: 02/21/2022 4:08:02 PM By: [name] MD FACS Previous Signature: 02/21/2022 2:24:27 PM Version By: [name] EMT Previous Signature: 02/21/2022 1:24:08 PM Version By: [name] EMT Entered By: [name] on 02/21/2022 16:08:02 -------------------------------------------------------------------------------- HBO Safety Checklist Details Patient Name: Date of Service: Theresa LLA Theresa Alexander D. 02/21/2022 11:45 A M Medical Record Number: [id number] Patient Account Number: [id number] Date of Birth/Sex: Treating RN: Oct 31, 1960 (61 y.o. [name] Primary Care Mairyn Lenahan: [name] Other Clinician: [name] Referring Robyne Matar: Treating Ezriel Boffa/Extender: [name] Weeks in Treatment: 15 HBO Safety Checklist Items Safety Checklist Consent Form Signed Patient voided / foley secured and emptied When did you last eato 1030 Last dose of injectable or oral agent NA Ostomy pouch emptied and vented if applicable NA All implantable devices assessed, documented and approved NA Intravenous access site secured and place NA Valuables secured Linens and cotton and cotton/polyester blend (less than 51% polyester) Personal oil-based products / skin lotions / body lotions removed Wigs or hairpieces removed NA Smoking or tobacco materials removed Books / newspapers / magazines / loose paper removed Cologne, aftershave, perfume and deodorant removed Jewelry removed (may wrap wedding band) NA Make-up removed Hair care products removed Battery operated devices (external) removed Heating patches and chemical warmers removed Titanium eyewear removed NA Nail polish cured greater than 10 hours  02/16/2022 Casting material cured greater than 10 hours NA Hearing aids removed NA Loose dentures or partials removed NA Prosthetics have been removed NA Patient demonstrates correct use of air break device (if applicable) Patient concerns have been addressed Patient grounding bracelet on and cord attached to  chamber Specifics for Inpatients (complete in addition to above) Medication sheet sent with patient NA Intravenous medications needed or due during therapy sent with patient NA Drainage tubes (e.g. nasogastric tube or chest tube secured and vented) NA Endotracheal or Tracheotomy tube secured NA Cuff deflated of air and inflated with saline NA Airway suctioned NA Notes The safety checklist was done before the treatment was started. Electronic Signature(s) Signed: 02/21/2022 1:22:29 PM By: [name] EMT Previous Signature: 02/21/2022 1:20:57 PM Version By: [name] EMT Entered By: [name] on 02/21/2022 13:22:29

## 2022-02-21 NOTE — Progress Notes (Signed)
DAO, MEARNS ([id number]) Visit Report for 02/21/2022 Arrival Information Details Patient Name: Date of Service: [name] 02/21/2022 11:45 A M Medical Record Number: [id number] Patient Account Number: [id number] Date of Birth/Sex: Treating RN: 12/04/1960 (61 y.o. [name] Primary Care Talajah Slimp: [name] Other Clinician: [name] Referring Alvan Culpepper: Treating Emmory Solivan/Extender: Mathews Argyle in Treatment: 15 Visit Information History Since Last Visit All ordered tests and consults were completed: Yes Patient Arrived: Ambulatory Added or deleted any medications: No Arrival Time: 11:45 Any new allergies or adverse reactions: No Accompanied By: None Had a fall or experienced change in No Transfer Assistance: None activities of daily living that may affect Patient Identification Verified: Yes risk of falls: Secondary Verification Process Completed: Yes Signs or symptoms of abuse/neglect since last visito No Patient Requires Transmission-Based Precautions: No Hospitalized since last visit: No Patient Has Alerts: No Implantable device outside of the clinic excluding No cellular tissue based products placed in the center since last visit: Pain Present Now: Yes Electronic Signature(s) Signed: 02/21/2022 1:21:45 PM By: [name] EMT Previous Signature: 02/21/2022 1:18:33 PM Version By: [name] EMT Previous Signature: 02/21/2022 1:18:08 PM Version By: [name] EMT Previous Signature: 02/21/2022 1:17:19 PM Version By: [name] EMT Entered By: [name] on 02/21/2022 13:21:44 -------------------------------------------------------------------------------- Encounter Discharge Information Details Patient Name: Date of Service: [name] 02/21/2022 11:45 A M Medical Record Number: [id number] Patient Account Number: [id number] Date of Birth/Sex: Treating RN: [birth year] (61 y.o. [name] Primary Care Jaylin Benzel: [name] Other Clinician: [name] Referring Kymoni Lesperance: Treating Kaeley Vinje/Extender: Mathews Argyle in Treatment: 15 Encounter Discharge Information Items Discharge Condition: Stable Ambulatory Status: Ambulatory Discharge Destination: Home Transportation: Private Auto Accompanied By: None Schedule Follow-up Appointment: Yes Clinical Summary of Care: Electronic Signature(s) Signed: 02/21/2022 2:25:37 PM By: [name] EMT Entered By: [name] on 02/21/2022 14:25:37 -------------------------------------------------------------------------------- Pain Assessment Details Patient Name: Date of Service: [name] 02/21/2022 11:45 A M Medical Record Number: [id number] Patient Account Number: [id number] Date of Birth/Sex: Treating RN: [birth year] (61 y.o. [name] Primary Care [name]: [name] Other Clinician: [name] Referring Bernard Donahoo: Treating Sotirios Navarro/Extender: [name] Weeks in Treatment: 15 Active Problems Location of Pain Severity and Description of Pain Patient Has Paino Yes Site Locations Pain Location: Generalized Pain Duration of the Pain. Constant / Intermittento Intermittent Rate the pain. Current Pain Level: 9 Worst Pain Level: 10 Least Pain Level: 0 Tolerable Pain Level: 6 Character of Pain Describe the Pain: Burning Pain Management and Medication Current Pain Management: Notes jaw pain Electronic Signature(s) Signed: 02/21/2022 1:16:21 PM By: [name] EMT Signed: 02/21/2022 4:31:25 PM By: Lorrin Jackson Entered By: [name] on 02/21/2022 13:16:20 -------------------------------------------------------------------------------- Vitals Details Patient Name: Date of Service: [name] 02/21/2022 11:45 A M Medical Record Number: [id number] Patient Account Number: [id number] Date of  Birth/Sex: Treating RN: [birth year] (61 y.o. [name] Primary Care Quida Glasser: [name] Other Clinician: [name] Referring Sharhonda Atwood: Treating Katey Barrie/Extender: [name] Weeks in Treatment: 15 Vital Signs Time Taken: 11:53 Temperature (F): 98.2 Height (in): 60 Pulse (bpm): 90 Weight (lbs): 88 Respiratory Rate (breaths/min): 16 Body Mass Index (BMI): 17.2 Blood Pressure (mmHg): 110/77 Reference Range: 80 - 120 mg / dl Electronic Signature(s) Signed: 02/21/2022 1:22:10 PM By: [name] EMT Previous Signature: 02/21/2022 1:19:17 PM Version By: [name] EMT Entered By:  [name] on 02/21/2022 13:22:09

## 2022-02-21 NOTE — Progress Notes (Signed)
LANNY, DONOSO ([id number]) Visit Report for 02/19/2022 SuperBill Details Patient Name: Date of Service: [name] 02/19/2022 Medical Record Number: [id number] Patient Account Number: [id number] Date of Birth/Sex: Treating RN: Jan 15, 1961 (61 y.o. 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

## 2022-02-21 NOTE — Progress Notes (Signed)
ENIOLA, CERULLO ([id number]) Visit Report for 02/21/2022 Problem List Details Patient Name: Date of Service: Theresa LLA [name] 02/21/2022 11:45 A M Medical Record Number: [id number] Patient Account Number: [id number] Date of Birth/Sex: Treating RN: 1961/03/12 (61 y.o. 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 02/21/2022 Medical Record Number: [id number] Patient Account Number: [id number] Date of Birth/Sex: Treating RN: May 03, 1961 (61 y.o. 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

## 2022-02-22 ENCOUNTER — Encounter (HOSPITAL_BASED_OUTPATIENT_CLINIC_OR_DEPARTMENT_OTHER): Payer: Medicare Other | Admitting: General Surgery

## 2022-02-22 DIAGNOSIS — M8788 Other osteonecrosis, other site: Secondary | ICD-10-CM | POA: Diagnosis not present

## 2022-02-22 NOTE — Progress Notes (Signed)
[name], [name] ([id number]) Visit Report for 02/22/2022 Problem List Details Patient Name: Date of Service: [name] 02/22/2022 1:00 PM Medical Record Number: [id number] Patient Account Number: [id number] Date of Birth/Sex: Treating RN: 02/18/1961 (61 y.o. 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 02/22/2022 Medical Record Number: [id number] Patient Account Number: [id number] Date of Birth/Sex: Treating RN: [birth year] (61 y.o. 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

## 2022-02-22 NOTE — Progress Notes (Signed)
HONOR, FRISON ([id number]) Visit Report for 02/22/2022 Arrival Information Details Patient Name: Date of Service: [name] 02/22/2022 1:00 PM Medical Record Number: [id number] Patient Account Number: [id number] Date of Birth/Sex: Treating RN: [birth year] (61 y.o. [name], [name] Primary Care Jaziah Kwasnik: [name] Other Clinician: [name] Referring Yan Pankratz: Treating Afrah Burlison/Extender: Mathews Argyle in Treatment: 15 Visit Information History Since Last Visit All ordered tests and consults were completed: Yes Patient Arrived: Ambulatory Added or deleted any medications: No Arrival Time: 11:45 Any new allergies or adverse reactions: No Accompanied By: None Had a fall or experienced change in No Transfer Assistance: None activities of daily living that may affect Patient Identification Verified: Yes risk of falls: Secondary Verification Process Completed: Yes Signs or symptoms of abuse/neglect since last visito No Patient Requires Transmission-Based Precautions: No Hospitalized since last visit: No Patient Has Alerts: No Implantable device outside of the clinic excluding No cellular tissue based products placed in the center since last visit: Pain Present Now: No Electronic Signature(s) Signed: 02/22/2022 2:38:38 PM By: [name] EMT Entered By: [name] on 02/22/2022 14:38:38 -------------------------------------------------------------------------------- Encounter Discharge Information Details Patient Name: Date of Service: [name] 02/22/2022 1:00 PM Medical Record Number: [id number] Patient Account Number: [id number] Date of Birth/Sex: Treating RN: 07/07/1960 (61 y.o. [name] Primary Care Camdyn Laden: [name] Other Clinician: [name] Referring Launi Asencio: Treating Ermalinda Joubert/Extender: Mathews Argyle in Treatment: 15 Encounter Discharge Information  Items Discharge Condition: Stable Ambulatory Status: Ambulatory Discharge Destination: Home Transportation: Private Auto Accompanied By: None Schedule Follow-up Appointment: Yes Clinical Summary of Care: Electronic Signature(s) Signed: 02/22/2022 2:42:03 PM By: [name] EMT Entered By: [name] on 02/22/2022 14:42:03 -------------------------------------------------------------------------------- Vitals Details Patient Name: Date of Service: [name] 02/22/2022 1:00 PM Medical Record Number: [id number] Patient Account Number: [id number] Date of Birth/Sex: Treating RN: 23-May-1961 (61 y.o. [name], [name] Primary Care Daniel Johndrow: [name] Other Clinician: [name] Referring Ezma Rehm: Treating Granger Chui/Extender: [name] Weeks in Treatment: 15 Vital Signs Time Taken: 11:58 Temperature (F): 97.9 Height (in): 60 Pulse (bpm): 98 Weight (lbs): 88 Respiratory Rate (breaths/min): 16 Body Mass Index (BMI): 17.2 Blood Pressure (mmHg): 93/77 Reference Range: 80 - 120 mg / dl Electronic Signature(s) Signed: 02/22/2022 2:39:02 PM By: [name] EMT Entered By: [name] on 02/22/2022 14:39:02

## 2022-02-22 NOTE — Progress Notes (Addendum)
**Note Theresa-Identified via Obfuscation** Theresa, Dillon ([id number]) Visit Report for 02/22/2022 HBO Details Patient Name: Date of Service: [name] 02/22/2022 1:00 PM Medical Record Number: [id number] Patient Account Number: [id number] Date of Birth/Sex: Treating RN: 1961-03-06 (61 y.o. 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, Jaw HBO Treatment Details Treatment Number: 69 Patient Type: Outpatient Chamber Type: Monoplace Chamber Serial #: [id number] Treatment Protocol: 2.5 ATA with 90 minutes oxygen, with two 5 minute air breaks Treatment Details Compression Rate Down: 2.0 psi / minute Theresa-Compression Rate Up: 2.0 psi / minute A breaks and breathing ir Compress Tx Pressure periods Decompress Decompress Begins Reached (leave unused spaces Begins Ends blank) Chamber Pressure (ATA 1 2.5 2.5 2.5 2.5 2.5 - - 2.5 1 ) Clock Time (24 hr) 11:59 12:13 12:43 12:48 13:18 13:23 - - 13:53 14:01 Treatment Length: 122 (minutes) Treatment Segments: 4 Vital Signs Capillary Blood Glucose Reference Range: 80 - 120 mg / dl HBO Diabetic Blood Glucose Intervention Range: <131 mg/dl or >249 mg/dl Time Vitals Blood Respiratory Capillary Blood Glucose Pulse Action Type: Pulse: Temperature: Taken: Pressure: Rate: Glucose (mg/dl): Meter #: Oximetry (%) Taken: Pre 11:58 93/77 98 16 97.9 Post 14:07 125/77 66 14 97.9 Treatment Response Treatment Toleration: Well Treatment Completion Status: Treatment Completed without Adverse Event Physician HBO Attestation: I certify that I supervised this HBO treatment in accordance with Medicare guidelines. A trained  emergency response team is readily available per Yes hospital policies and procedures. Continue HBOT as ordered. Yes Electronic Signature(s) Signed: 02/22/2022 4:55:29 PM By: Theresa Maudlin MD FACS Previous Signature: 02/22/2022 2:41:13 PM Version By: [name] EMT Entered By: [name] on 02/22/2022 16:55:29 -------------------------------------------------------------------------------- HBO Safety Checklist Details Patient Name: Date of Service: [name] 02/22/2022 1:00 PM Medical Record Number: [id number] Patient Account Number: [id number] Date of Birth/Sex: Treating RN: [birth year] (61 y.o. [name], [name] Primary Care [name]: [name] Other Clinician: Valeria Dillon Referring [name]: Treating Ranyah Groeneveld/Extender: [name] Weeks in Treatment: 15 HBO Safety Checklist Items Safety Checklist Consent Form Signed Patient voided / foley secured and emptied When did you last eato 1000 Last dose of injectable or oral agent NA Ostomy pouch emptied and vented if applicable NA All implantable devices assessed, documented and approved NA Intravenous access site secured and place NA Valuables secured Linens and cotton and cotton/polyester blend (less than 51% polyester) Personal oil-based products / skin lotions / body lotions removed Wigs or hairpieces removed NA Smoking or tobacco materials removed Books / newspapers / magazines / loose paper removed Cologne, aftershave, perfume and deodorant removed Jewelry removed (may wrap wedding band) NA Make-up removed NA Hair care products removed Battery operated devices (external) removed Heating patches and chemical warmers removed Titanium eyewear removed NA Nail polish cured greater than 10 hours Casting material cured greater than 10 hours NA Hearing aids removed NA Loose dentures or partials removed NA Prosthetics have been removed NA Patient demonstrates  correct use of air break device (if applicable) Patient concerns have been addressed Patient grounding bracelet on and cord attached to chamber Specifics for Inpatients (complete in addition to above) Medication sheet sent with patient NA Intravenous medications needed or due during therapy sent with patient NA Drainage  tubes (e.g. nasogastric tube or chest tube secured and vented) NA Endotracheal or Tracheotomy tube secured NA Cuff deflated of air and inflated with saline NA Airway suctioned NA Notes The safety checklist was done before the treatment was started. Electronic Signature(s) Signed: 02/22/2022 2:40:05 PM By: [name] EMT Entered By: [name] on 02/22/2022 14:40:05

## 2022-02-25 ENCOUNTER — Encounter (HOSPITAL_BASED_OUTPATIENT_CLINIC_OR_DEPARTMENT_OTHER): Payer: Medicare Other | Admitting: Internal Medicine

## 2022-02-25 DIAGNOSIS — C411 Malignant neoplasm of mandible: Secondary | ICD-10-CM | POA: Diagnosis not present

## 2022-02-25 DIAGNOSIS — M8788 Other osteonecrosis, other site: Secondary | ICD-10-CM | POA: Diagnosis not present

## 2022-02-25 DIAGNOSIS — Z923 Personal history of irradiation: Secondary | ICD-10-CM

## 2022-02-25 DIAGNOSIS — M272 Inflammatory conditions of jaws: Secondary | ICD-10-CM

## 2022-02-25 NOTE — Progress Notes (Addendum)
[name], SCOTTI ([id number]) Visit Report for 02/25/2022 Arrival Information Details Patient Name: Date of Service: [name] 02/25/2022 1:00 PM Medical Record Number: [id number] Patient Account Number: [id number] Date of Birth/Sex: Treating RN: [birth year] (61 y.o. [name] Primary Care Lindalee Huizinga: [name] Other Clinician: Donavan Burnet Referring Kalan Yeley: Treating Dalphine Cowie/Extender: [name] in Treatment: 85 Visit Information History Since Last Visit All ordered tests and consults were completed: Yes Patient Arrived: Ambulatory Added or deleted any medications: No Arrival Time: 11:44 Any new allergies or adverse reactions: No Accompanied By: self Had a fall or experienced change in No Transfer Assistance: None activities of daily living that may affect Patient Identification Verified: Yes risk of falls: Secondary Verification Process Completed: Yes Signs or symptoms of abuse/neglect since last visito No Patient Requires Transmission-Based Precautions: No Hospitalized since last visit: No Patient Has Alerts: No Implantable device outside of the clinic excluding No cellular tissue based products placed in the center since last visit: Pain Present Now: No Electronic Signature(s) Signed: 02/25/2022 2:32:21 PM By: Donavan Burnet CHT EMT BS , , Entered By: Donavan Burnet on 02/25/2022 14:32:21 -------------------------------------------------------------------------------- Encounter Discharge Information Details Patient Name: Date of Service: [name] 02/25/2022 1:00 PM Medical Record Number: [id number] Patient Account Number: [id number] Date of Birth/Sex: Treating RN: Jan 11, 1961 (61 y.o. [name] Primary Care Annjanette Wertenberger: [name] Other Clinician: Donavan Burnet Referring Joselinne Lawal: Treating Lateefah Mallery/Extender: [name] in Treatment:  16 Encounter Discharge Information Items Discharge Condition: Stable Ambulatory Status: Ambulatory Discharge Destination: Home Transportation: Private Auto Accompanied By: self Schedule Follow-up Appointment: No Clinical Summary of Care: Electronic Signature(s) Signed: 02/25/2022 2:37:37 PM By: Donavan Burnet CHT EMT BS , , Entered By: Donavan Burnet on 02/25/2022 14:37:37 -------------------------------------------------------------------------------- [place] Details Patient Name: Date of Service: [name] 02/25/2022 1:00 PM Medical Record Number: [id number] Patient Account Number: [id number] Date of Birth/Sex: Treating RN: [birth year] (61 y.o. [name] Primary Care Bayler Nehring: [name] Other Clinician: [name] Referring Chanele Douglas: Treating Lynzee Lindquist/Extender: [name] Weeks in Treatment: 16 Vital Signs Time Taken: 14:10 Temperature (F): 98.3 Height (in): 60 Pulse (bpm): 79 Weight (lbs): 88 Respiratory Rate (breaths/min): 16 Body Mass Index (BMI): 17.2 Blood Pressure (mmHg): 131/83 Capillary Blood Glucose (mg/dl): 79 Reference Range: 80 - 120 mg / dl Electronic Signature(s) Signed: 02/25/2022 2:33:26 PM By: Donavan Burnet CHT EMT BS , , Entered By: Donavan Burnet on 02/25/2022 14:33:26

## 2022-02-25 NOTE — Progress Notes (Addendum)
[name], [name] ([id number]) Visit Report for 02/25/2022 HBO Details Patient Name: Date of Service: Theresa LLA [name] 02/25/2022 1:00 PM Medical Record Number: [id number] Patient Account Number: [id number] Date of Birth/Sex: Treating RN: [birth year] (61 y.o. 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, Jaw HBO Treatment Details Treatment Number: 70 Patient Type: Outpatient Chamber Type: Monoplace Chamber Serial #: [id number] Treatment Protocol: 2.5 ATA with 90 minutes oxygen, with two 5 minute air breaks Treatment Details Compression Rate Down: 2.0 psi / minute De-Compression Rate Up: 2.0 psi / minute A breaks and breathing ir Compress Tx Pressure periods Decompress Decompress Begins Reached (leave unused spaces Begins Ends blank) Chamber Pressure (ATA 1 2.5 2.5 2.5 2.5 2.5 - - 2.5 1 ) Clock Time (24 hr) 12:02 12:16 12:46 12:51 13:21 13:26 - - 13:56 14:07 Treatment Length: 125 (minutes) Treatment Segments: 4 Vital Signs Capillary Blood Glucose Reference Range: 80 - 120 mg / dl HBO Diabetic Blood Glucose Intervention Range: <131 mg/dl or >249 mg/dl Type: Time Vitals Blood Respiratory Capillary Blood Glucose Pulse Action Pulse: Temperature: Taken: Pressure: Rate: Glucose (mg/dl): Meter #: Oximetry (%) Taken: Pre 14:10 118/74 94 16 98.1 none per protocol Post 14:10 131/83 79 16 98.3 none per protocol Treatment Response Treatment Toleration: Well Treatment Completion Status: Treatment Completed without Adverse Event Treatment Notes Patient wants to speak to Anagha Loseke about signs/symptoms of thrush in her  mouth. Physician HBO Attestation: I certify that I supervised this HBO treatment in accordance with Medicare guidelines. A trained emergency response team is readily available per Yes hospital policies and procedures. Continue HBOT as ordered. Yes Electronic Signature(s) Signed: 02/25/2022 4:36:36 PM By: [name] DO Previous Signature: 02/25/2022 2:38:46 PM Version By: Donavan Burnet CHT EMT BS , , Previous Signature: 02/25/2022 2:36:49 PM Version By: Donavan Burnet CHT EMT BS , , Entered By: [name] on 02/25/2022 15:48:24 -------------------------------------------------------------------------------- HBO Safety Checklist Details Patient Name: Date of Service: Theresa [name] 02/25/2022 1:00 PM Medical Record Number: [id number] Patient Account Number: [id number] Date of Birth/Sex: Treating RN: 06/27/1961 (61 y.o. [name] Primary Care Satoria Dunlop: [name] Other Clinician: Donavan Burnet Referring Rayyan Orsborn: Treating Marylan Glore/Extender: [name] Weeks in Treatment: 16 HBO Safety Checklist Items Safety Checklist Consent Form Signed Patient voided / foley secured and emptied When did you last eato 1100 Last dose of injectable or oral agent n/a Ostomy pouch emptied and vented if applicable NA All implantable devices assessed, documented and approved NA Intravenous access site secured and place NA Valuables secured Linens and cotton and cotton/polyester blend (less than 51% polyester) Personal oil-based products / skin lotions / body lotions removed Wigs or hairpieces removed NA Smoking or tobacco materials removed NA Books / newspapers / magazines / loose paper removed Cologne, aftershave, perfume and deodorant removed Jewelry removed (may wrap wedding band) Make-up removed Hair care products removed Battery operated devices (external) removed Heating patches and chemical warmers removed Titanium  eyewear removed NA Nail polish cured greater than 10 hours Casting material cured greater than 10 hours Hearing aids removed Loose dentures or partials removed Prosthetics have been removed NA Patient demonstrates correct use of air break device (if applicable) Patient concerns have been addressed Patient  grounding bracelet on and cord attached to chamber Specifics for Inpatients (complete in addition to above) Medication sheet sent with patient NA Intravenous medications needed or due during therapy sent with patient NA Drainage tubes (e.g. nasogastric tube or chest tube secured and vented) NA Endotracheal or Tracheotomy tube secured NA Cuff deflated of air and inflated with saline NA Airway suctioned NA Notes Paper version used prior to treatment start. Electronic Signature(s) Signed: 02/25/2022 2:35:16 PM By: Donavan Burnet CHT EMT BS , , Previous Signature: 02/25/2022 2:34:47 PM Version By: Donavan Burnet CHT EMT BS , , Entered By: Donavan Burnet on 02/25/2022 14:35:16

## 2022-02-25 NOTE — Progress Notes (Signed)
KYNDAHL, JABLON ([id number]) Visit Report for 02/25/2022 [place] Details Patient Name: Date of Service: [name] 02/25/2022 Medical Record Number: [id number] Patient Account Number: [id number] Date of Birth/Sex: Treating RN: 1961-03-29 (61 y.o. 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

## 2022-02-26 ENCOUNTER — Encounter (HOSPITAL_BASED_OUTPATIENT_CLINIC_OR_DEPARTMENT_OTHER): Payer: Medicare Other | Admitting: Internal Medicine

## 2022-02-26 DIAGNOSIS — M272 Inflammatory conditions of jaws: Secondary | ICD-10-CM | POA: Diagnosis not present

## 2022-02-26 DIAGNOSIS — C411 Malignant neoplasm of mandible: Secondary | ICD-10-CM | POA: Diagnosis not present

## 2022-02-26 DIAGNOSIS — M8788 Other osteonecrosis, other site: Secondary | ICD-10-CM | POA: Diagnosis not present

## 2022-02-26 DIAGNOSIS — Z923 Personal history of irradiation: Secondary | ICD-10-CM

## 2022-02-26 NOTE — Progress Notes (Addendum)
FINN, ALTEMOSE ([id number]) Visit Report for 02/26/2022 HBO Details Patient Name: Date of Service: [name] 02/26/2022 1:00 PM Medical Record Number: [id number] Patient Account Number: [id number] Date of Birth/Sex: Treating RN: [birth year] (61 y.o. 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, Jaw HBO Treatment Details Treatment Number: 71 Patient Type: Outpatient Chamber Type: Monoplace Chamber Serial #: [id number] Treatment Protocol: 2.5 ATA with 90 minutes oxygen, with two 5 minute air breaks Treatment Details Compression Rate Down: 2.0 psi / minute De-Compression Rate Up: 2.0 psi / minute A breaks and breathing ir Compress Tx Pressure periods Decompress Decompress Begins Reached (leave unused spaces Begins Ends blank) Chamber Pressure (ATA 1 2.5 2.5 2.5 2.5 2.5 - - 2.5 1 ) Clock Time (24 hr) 13:18 13:30 14:00 14:05 14:35 14:40 - - 15:11 15:22 Treatment Length: 124 (minutes) Treatment Segments: 4 Vital Signs Capillary Blood Glucose Reference Range: 80 - 120 mg / dl HBO Diabetic Blood Glucose Intervention Range: <131 mg/dl or >249 mg/dl Time Vitals Blood Respiratory Capillary Blood Glucose Pulse Action Type: Pulse: Temperature: Taken: Pressure: Rate: Glucose (mg/dl): Meter #: Oximetry (%) Taken: Pre 13:11 129/96 94 16 98.7 Post 15:26 123/75 65 16 98.1 Treatment Response Treatment Toleration: Well Treatment Completion Status: Treatment Completed without Adverse Event Physician HBO Attestation: I certify that I supervised this HBO treatment in accordance with Medicare guidelines. A trained  emergency response team is readily available per Yes hospital policies and procedures. Continue HBOT as ordered. Yes Electronic Signature(s) Signed: 02/26/2022 4:26:57 PM By: [name] DO Previous Signature: 02/26/2022 4:06:26 PM Version By: [name] EMT Entered By: [name] on 02/26/2022 16:26:04 -------------------------------------------------------------------------------- HBO Safety Checklist Details Patient Name: Date of Service: [name] 02/26/2022 1:00 PM Medical Record Number: [id number] Patient Account Number: [id number] Date of Birth/Sex: Treating RN: [birth year] (61 y.o. [name], [name] Primary Care Adina Puzzo: [name] Other Clinician: [name] Referring Danielly Ackerley: Treating Marqueta Pulley/Extender: [name] Weeks in Treatment: 16 HBO Safety Checklist Items Safety Checklist Consent Form Signed Patient voided / foley secured and emptied When did you last eato 1130 Last dose of injectable or oral agent NA Ostomy pouch emptied and vented if applicable NA All implantable devices assessed, documented and approved NA Intravenous access site secured and place [employer] blend (less than 51% polyester) Personal oil-based products / skin lotions / body lotions removed Wigs or hairpieces removed NA Smoking or tobacco materials removed Books / newspapers / magazines / loose paper removed Cologne, aftershave, perfume and deodorant removed Jewelry removed (may wrap wedding band) NA Make-up removed Hair care products removed Battery operated devices (external) removed Heating patches and chemical warmers removed Titanium eyewear removed NA Nail polish cured greater than 10 hours Casting material cured greater than 10 hours NA Hearing aids removed NA Loose dentures or partials removed NA Prosthetics have been removed NA Patient demonstrates  correct use of air break device (if applicable) Patient concerns have been addressed Patient grounding bracelet on and cord attached to chamber Specifics for Inpatients (complete in addition to above) Medication sheet sent with patient NA Intravenous medications needed or due during therapy sent with patient NA Drainage tubes (  e.g. nasogastric tube or chest tube secured and vented) NA Endotracheal or Tracheotomy tube secured NA Cuff deflated of air and inflated with saline NA Airway suctioned NA Notes The safety checklist was done before the treatment was started Electronic Signature(s) Signed: 02/26/2022 4:04:31 PM By: [name] EMT Entered By: [name] on 02/26/2022 16:04:31

## 2022-02-26 NOTE — Progress Notes (Signed)
[name], MILES ([id number]) Visit Report for 02/26/2022 Problem List Details Patient Name: Date of Service: [name] 02/26/2022 1:00 PM Medical Record Number: [id number] Patient Account Number: [id number] Date of Birth/Sex: Treating RN: 05/29/1961 (61 y.o. 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 02/26/2022 Medical Record Number: [id number] Patient Account Number: [id number] Date of Birth/Sex: Treating RN: 10-20-1960 (61 y.o. 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

## 2022-02-26 NOTE — Progress Notes (Signed)
[name], [name] ([id number]) Visit Report for 02/26/2022 Arrival Information Details Patient Name: Date of Service: Theresa LLA [name] 02/26/2022 1:00 PM Medical Record Number: [id number] Patient Account Number: [id number] Date of Birth/Sex: Treating RN: [name], 1960 (61 y.o. [name], [name] Primary Care [name]: [name] Other Clinician: Valeria Dillon Referring [name]: Treating [name]/Extender: [name] in Treatment: 33 Visit Information History Since Last Visit All ordered tests and consults were completed: Yes Patient Arrived: Ambulatory Added or deleted any medications: No Arrival Time: 13:05 Any new allergies or adverse reactions: No Accompanied By: None Had a fall or experienced change in No Transfer Assistance: None activities of daily living that may affect Patient Identification Verified: Yes risk of falls: Secondary Verification Process Completed: Yes Signs or symptoms of abuse/neglect since last visito No Patient Requires Transmission-Based Precautions: No Hospitalized since last visit: No Patient Has Alerts: No Implantable device outside of the clinic excluding No cellular tissue based products placed in the center since last visit: Pain Present Now: No Electronic Signature(s) Signed: 02/26/2022 4:02:28 PM By: [name] Entered By: [name] on 02/26/2022 16:02:28 -------------------------------------------------------------------------------- Encounter Discharge Information Details Patient Name: Date of Service: Theresa [name] 02/26/2022 1:00 PM Medical Record Number: [id number] Patient Account Number: [id number] Date of Birth/Sex: Treating RN: 1960-12-19 (61 y.o. [name] Primary Care [name]: [name] Other Clinician: Valeria Dillon Referring [name]: Treating [name]/Extender: [name] in Treatment: 46 Encounter Discharge Information  Items Discharge Condition: Stable Ambulatory Status: Ambulatory Discharge Destination: Home Transportation: Private Auto Accompanied By: None Schedule Follow-up Appointment: Yes Clinical Summary of Care: Electronic Signature(s) Signed: 02/26/2022 4:07:21 PM By: [name] Entered By: [name] on 02/26/2022 16:07:21 -------------------------------------------------------------------------------- Vitals Details Patient Name: Date of Service: Theresa LLA [name] 02/26/2022 1:00 PM Medical Record Number: [id number] Patient Account Number: [id number] Date of Birth/Sex: Treating RN: 10-30-1960 (61 y.o. [name], [name] Primary Care [name]: [name] Other Clinician: Valeria Dillon Referring [name]: Treating Shamonica Schadt/Extender: [name] Weeks in Treatment: 16 Vital Signs Time Taken: 13:11 Temperature (F): 98.7 Height (in): 60 Pulse (bpm): 94 Weight (lbs): 88 Respiratory Rate (breaths/min): 16 Body Mass Index (BMI): 17.2 Blood Pressure (mmHg): 129/96 Reference Range: 80 - 120 mg / dl Electronic Signature(s) Signed: 02/26/2022 4:03:24 PM By: [name] Entered By: [name] on 02/26/2022 16:03:24

## 2022-02-27 ENCOUNTER — Encounter (HOSPITAL_BASED_OUTPATIENT_CLINIC_OR_DEPARTMENT_OTHER): Payer: Medicare Other | Admitting: General Surgery

## 2022-02-27 DIAGNOSIS — M8788 Other osteonecrosis, other site: Secondary | ICD-10-CM | POA: Diagnosis not present

## 2022-02-27 NOTE — Progress Notes (Addendum)
Theresa, Dillon ([id number]) Visit Report for 02/27/2022 HBO Details Patient Name: Date of Service: [name] 02/27/2022 1:00 PM Medical Record Number: [id number] Patient Account Number: [id number] Date of Birth/Sex: Treating RN: Jan 29, 1961 (61 y.o. 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, Jaw HBO Treatment Details Treatment Number: 72 Patient Type: Outpatient Chamber Type: Monoplace Chamber Serial #: [id number] Treatment Protocol: 2.5 ATA with 90 minutes oxygen, with two 5 minute air breaks Treatment Details Compression Rate Down: 2.0 psi / minute De-Compression Rate Up: 2.0 psi / minute A breaks and breathing ir Compress Tx Pressure periods Decompress Decompress Begins Reached (leave unused spaces Begins Ends blank) Chamber Pressure (ATA 1 2.5 2.5 2.5 2.5 2.5 - - 2.5 1 ) Clock Time (24 hr) 13:00 13:12 13:42 13:47 14:17 14:22 - - 14:52 15:04 Treatment Length: 124 (minutes) Treatment Segments: 4 Vital Signs Capillary Blood Glucose Reference Range: 80 - 120 mg / dl HBO Diabetic Blood Glucose Intervention Range: <131 mg/dl or >249 mg/dl Type: Time Vitals Blood Respiratory Capillary Blood Glucose Pulse Action Pulse: Temperature: Taken: Pressure: Rate: Glucose (mg/dl): Meter #: Oximetry (%) Taken: Pre 12:56 114/77 80 16 98.2 none per protocol Post 15:08 121/77 64 16 98.2 none per protocol Treatment Response Treatment Toleration: Well Treatment Completion Status: Treatment Completed without Adverse Event Physician HBO Attestation: I certify that I supervised this HBO treatment in accordance with  Medicare guidelines. A trained emergency response team is readily available per Yes hospital policies and procedures. Continue HBOT as ordered. Yes Electronic Signature(s) Signed: 02/27/2022 3:58:10 PM By: [name] MD FACS Previous Signature: 02/27/2022 3:40:16 PM Version By: Donavan Burnet CHT EMT BS , , Entered By: [name] on 02/27/2022 15:58:09 -------------------------------------------------------------------------------- HBO Safety Checklist Details Patient Name: Date of Service: [name] 02/27/2022 1:00 PM Medical Record Number: [id number] Patient Account Number: [id number] Date of Birth/Sex: Treating RN: 16-Jul-1960 (61 y.o. [name], [name] Primary Care Ezekiel Menzer: [name] Other Clinician: Donavan Burnet Referring Mishell Donalson: Treating Hilmer Aliberti/Extender: [name] Weeks in Treatment: 16 HBO Safety Checklist Items Safety Checklist Consent Form Signed Patient voided / foley secured and emptied When did you last eato 1030 Last dose of injectable or oral agent n/a Ostomy pouch emptied and vented if applicable NA All implantable devices assessed, documented and approved NA Intravenous access site secured and place NA Valuables secured Linens and cotton and cotton/polyester blend (less than 51% polyester) Personal oil-based products / skin lotions / body lotions removed Wigs or hairpieces removed NA Smoking or tobacco materials removed NA Books / newspapers / magazines / loose paper removed Cologne, aftershave, perfume and deodorant removed Jewelry removed (may wrap wedding band) Make-up removed Hair care products removed Battery operated devices (external) removed Heating patches and chemical warmers removed Titanium eyewear removed NA Nail polish cured greater than 10 hours Casting material cured greater than 10 hours NA Hearing aids removed NA Loose dentures or partials removed NA Prosthetics  have been removed NA Patient demonstrates correct use of air break device (if applicable) Patient concerns have been addressed Patient grounding bracelet on and cord attached to chamber Specifics for Inpatients (complete in addition to above) Medication sheet sent with patient NA Intravenous medications needed  or due during therapy sent with patient NA Drainage tubes (e.g. nasogastric tube or chest tube secured and vented) NA Endotracheal or Tracheotomy tube secured NA Cuff deflated of air and inflated with saline NA Airway suctioned NA Notes Paper version used prior to treatment start. Electronic Signature(s) Signed: 02/27/2022 3:39:14 PM By: Donavan Burnet CHT EMT BS , , Entered By: Donavan Burnet on 02/27/2022 15:39:13

## 2022-02-27 NOTE — Progress Notes (Signed)
[name], [name] ([id number]) Visit Report for 02/27/2022 SuperBill Details Patient Name: Date of Service: [name] 02/27/2022 Medical Record Number: [id number] Patient Account Number: [id number] Date of Birth/Sex: Treating RN: September 20, 1960 (61 y.o. 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

## 2022-02-27 NOTE — Progress Notes (Addendum)
AZYAH, FLETT ([id number]) Visit Report for 02/27/2022 Arrival Information Details Patient Name: Date of Service: [name] 02/27/2022 1:00 PM Medical Record Number: [id number] Patient Account Number: [id number] Date of Birth/Sex: Treating RN: [birth year] (61 y.o. [name], [name] Primary Care Chailyn Racette: [name] Other Clinician: Donavan Burnet Referring Calvina Liptak: Treating Tiffannie Sloss/Extender: Mathews Argyle in Treatment: 57 Visit Information History Since Last Visit All ordered tests and consults were completed: Yes Patient Arrived: Ambulatory Added or deleted any medications: No Arrival Time: 12:20 Any new allergies or adverse reactions: No Accompanied By: self Had a fall or experienced change in No Transfer Assistance: None activities of daily living that may affect Patient Identification Verified: Yes risk of falls: Secondary Verification Process Completed: Yes Signs or symptoms of abuse/neglect since last visito No Patient Requires Transmission-Based Precautions: No Hospitalized since last visit: No Patient Has Alerts: No Implantable device outside of the clinic excluding No cellular tissue based products placed in the center since last visit: Pain Present Now: No Electronic Signature(s) Signed: 02/27/2022 3:36:39 PM By: Donavan Burnet CHT EMT BS , , Entered By: Donavan Burnet on 02/27/2022 15:36:38 -------------------------------------------------------------------------------- Encounter Discharge Information Details Patient Name: Date of Service: [name] 02/27/2022 1:00 PM Medical Record Number: [id number] Patient Account Number: [id number] Date of Birth/Sex: Treating RN: October 07, 1960 (61 y.o. [name] Primary Care Margrit Minner: [name] Other Clinician: Donavan Burnet Referring Ericka Marcellus: Treating Crescent Gotham/Extender: Mathews Argyle in Treatment: 16 Encounter  Discharge Information Items Discharge Condition: Stable Ambulatory Status: Ambulatory Discharge Destination: Home Transportation: Private Auto Accompanied By: self Schedule Follow-up Appointment: No Clinical Summary of Care: Electronic Signature(s) Signed: 02/27/2022 3:41:04 PM By: Donavan Burnet CHT EMT BS , , Entered By: Donavan Burnet on 02/27/2022 15:41:03 -------------------------------------------------------------------------------- Vitals Details Patient Name: Date of Service: [name] 02/27/2022 1:00 PM Medical Record Number: [id number] Patient Account Number: [id number] Date of Birth/Sex: Treating RN: Nov 03, 1960 (61 y.o. [name], [name] Primary Care Torry Istre: [name] Other Clinician: Donavan Burnet Referring Heinz Eckert: Treating Daniell Paradise/Extender: Mathews Argyle in Treatment: 16 Vital Signs Time Taken: 12:56 Temperature (F): 98.2 Height (in): 60 Pulse (bpm): 80 Weight (lbs): 88 Respiratory Rate (breaths/min): 16 Body Mass Index (BMI): 17.2 Blood Pressure (mmHg): 114/77 Reference Range: 80 - 120 mg / dl Electronic Signature(s) Signed: 02/27/2022 3:38:14 PM By: Donavan Burnet CHT EMT BS , , Entered By: Donavan Burnet on 02/27/2022 15:38:14

## 2022-02-28 ENCOUNTER — Encounter (HOSPITAL_BASED_OUTPATIENT_CLINIC_OR_DEPARTMENT_OTHER): Payer: Medicare Other | Admitting: General Surgery

## 2022-02-28 DIAGNOSIS — M8788 Other osteonecrosis, other site: Secondary | ICD-10-CM | POA: Diagnosis not present

## 2022-02-28 NOTE — Progress Notes (Signed)
[name], [name] ([id number]) Visit Report for 02/28/2022 HBO Details Patient Name: Date of Service: Theresa LLA Theresa Alexander D. 02/28/2022 1:00 PM Medical Record Number: [id number] Patient Account Number: [id number] Date of Birth/Sex: Treating RN: Feb 18, 1961 (61 y.o. 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, Jaw HBO Treatment Details Treatment Number: 73 Patient Type: Outpatient Chamber Type: Monoplace Chamber Serial #: [id number] Treatment Protocol: 2.5 ATA with 90 minutes oxygen, with two 5 minute air breaks Treatment Details Compression Rate Down: 1.0 psi / minute De-Compression Rate Up: 2.0 psi / minute A breaks and breathing ir Compress Tx Pressure periods Decompress Decompress Begins Reached (leave unused spaces Begins Ends blank) Chamber Pressure (ATA 1 2.5 2.5 2.5 2.5 2.5 - - 2.5 1 ) Clock Time (24 hr) 12:33 12:55 13:25 13:30 14:00 14:05 - - 14:35 14:46 Treatment Length: 133 (minutes) Treatment Segments: 4 Vital Signs Capillary Blood Glucose Reference Range: 80 - 120 mg / dl HBO Diabetic Blood Glucose Intervention Range: <131 mg/dl or >249 mg/dl Type: Time Vitals Blood Respiratory Capillary Blood Glucose Pulse Action Pulse: Temperature: Taken: Pressure: Rate: Glucose (mg/dl): Meter #: Oximetry (%) Taken: Pre 12:29 110/70 86 18 98.4 none per protocol Post 14:51 139/66 58 16 97.9 none per protocol Treatment Response Treatment Toleration: Well Treatment Completion Status: Treatment Completed without Adverse Event Physician HBO Attestation: I certify that I supervised this HBO treatment in accordance  with Medicare guidelines. A trained emergency response team is readily available per Yes hospital policies and procedures. Continue HBOT as ordered. Yes Electronic Signature(s) Signed: 02/28/2022 3:26:46 PM By: Theresa Maudlin MD FACS Previous Signature: 02/28/2022 3:05:20 PM Version By: [name] CHT EMT BS , , Entered By: [name] on 02/28/2022 15:26:46 -------------------------------------------------------------------------------- HBO Safety Checklist Details Patient Name: Date of Service: Theresa LLA HA Theresa Bienenstock D. 02/28/2022 1:00 PM Medical Record Number: [id number] Patient Account Number: [id number] Date of Birth/Sex: Treating RN: 05-29-1961 (61 y.o. [name] Primary Care [name]: [name] Other Clinician: Donavan Dillon Referring [name]: Treating [name]/Extender: [name] Weeks in Treatment: 16 HBO Safety Checklist Items Safety Checklist Consent Form Signed Patient voided / foley secured and emptied When did you last eato 1000 Last dose of injectable or oral agent n/a Ostomy pouch emptied and vented if applicable NA All implantable devices assessed, documented and approved NA Intravenous access site secured and place NA Valuables secured Linens and cotton and cotton/polyester blend (less than 51% polyester) Personal oil-based products / skin lotions / body lotions removed Wigs or hairpieces removed NA Smoking or tobacco materials removed NA Books / newspapers / magazines / loose paper removed Cologne, aftershave, perfume and deodorant removed Jewelry removed (may wrap wedding band) Make-up removed Hair care products removed Battery operated devices (external) removed Heating patches and chemical warmers removed NA Titanium eyewear removed NA Nail polish cured greater than 10 hours Casting material cured greater than 10 hours NA Hearing aids removed NA Loose dentures or partials  removed NA Prosthetics have been removed NA Patient demonstrates correct use of air break device (if applicable) Patient concerns have been addressed Patient grounding bracelet on and cord attached to chamber Specifics for Inpatients (complete in addition to above) Medication sheet sent with patient NA Intravenous medications  needed or due during therapy sent with patient NA Drainage tubes (e.g. nasogastric tube or chest tube secured and vented) NA Endotracheal or Tracheotomy tube secured NA Cuff deflated of air and inflated with saline NA Airway suctioned NA Notes Paper version used prior to treatment start. Electronic Signature(s) Signed: 02/28/2022 3:04:09 PM By: [name] CHT EMT BS , , Entered By: [name] on 02/28/2022 15:04:08

## 2022-02-28 NOTE — Progress Notes (Signed)
[name] ([id number]) Visit Report for 02/28/2022 Arrival Information Details Patient Name: Date of Service: [name] 02/28/2022 1:00 PM Medical Record Number: [id number] Patient Account Number: [id number] Date of Birth/Sex: Treating RN: 04-Jun-1961 (61 y.o. [name], [name] Primary Care Sidrah Harden: [name] Other Clinician: Donavan Burnet Referring Phebe Dettmer: Treating Jumanah Hynson/Extender: Mathews Argyle in Treatment: 89 Visit Information History Since Last Visit All ordered tests and consults were completed: Yes Patient Arrived: Ambulatory Added or deleted any medications: No Arrival Time: 12:18 Any new allergies or adverse reactions: No Accompanied By: self Had a fall or experienced change in No Transfer Assistance: None activities of daily living that may affect Patient Identification Verified: Yes risk of falls: Secondary Verification Process Completed: Yes Signs or symptoms of abuse/neglect since last visito No Patient Requires Transmission-Based Precautions: No Hospitalized since last visit: No Patient Has Alerts: No Implantable device outside of the clinic excluding No cellular tissue based products placed in the center since last visit: Pain Present Now: No Electronic Signature(s) Signed: 02/28/2022 3:02:41 PM By: Donavan Burnet CHT EMT BS , , Entered By: Donavan Burnet on 02/28/2022 15:02:41 -------------------------------------------------------------------------------- Encounter Discharge Information Details Patient Name: Date of Service: [name] 02/28/2022 1:00 PM Medical Record Number: [id number] Patient Account Number: [id number] Date of Birth/Sex: Treating RN: [birth year] (61 y.o. [name] Primary Care Jayanna Kroeger: [name] Other Clinician: Donavan Burnet Referring Charene Mccallister: Treating Ousmane Seeman/Extender: Mathews Argyle in Treatment:  16 Encounter Discharge Information Items Discharge Condition: Stable Ambulatory Status: Ambulatory Discharge Destination: Home Transportation: Private Auto Accompanied By: self Schedule Follow-up Appointment: No Clinical Summary of Care: Electronic Signature(s) Signed: 02/28/2022 3:12:45 PM By: Donavan Burnet CHT EMT BS , , Entered By: Donavan Burnet on 02/28/2022 15:12:45 -------------------------------------------------------------------------------- Vitals Details Patient Name: Date of Service: [name] 02/28/2022 1:00 PM Medical Record Number: [id number] Patient Account Number: [id number] Date of Birth/Sex: Treating RN: Jan 29, 1961 (61 y.o. [name] Primary Care [name]: [name] Other Clinician: Donavan Burnet Referring Karema Tocci: Treating Nychelle Cassata/Extender: Mathews Argyle in Treatment: 16 Vital Signs Time Taken: 12:29 Temperature (F): 98.4 Height (in): 60 Pulse (bpm): 86 Weight (lbs): 88 Respiratory Rate (breaths/min): 18 Body Mass Index (BMI): 17.2 Blood Pressure (mmHg): 110/70 Reference Range: 80 - 120 mg / dl Electronic Signature(s) Signed: 02/28/2022 3:03:03 PM By: Donavan Burnet CHT EMT BS , , Entered By: Donavan Burnet on 02/28/2022 15:03:03

## 2022-02-28 NOTE — Progress Notes (Signed)
YUMA, BLUCHER ([id number]) Visit Report for 02/28/2022 SuperBill Details Patient Name: Date of Service: [name] 02/28/2022 Medical Record Number: [id number] Patient Account Number: [id number] Date of Birth/Sex: Treating RN: May 09, 1961 (61 y.o. 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

## 2022-03-01 ENCOUNTER — Encounter (HOSPITAL_BASED_OUTPATIENT_CLINIC_OR_DEPARTMENT_OTHER): Payer: Medicare Other | Attending: General Surgery | Admitting: General Surgery

## 2022-03-01 DIAGNOSIS — M272 Inflammatory conditions of jaws: Secondary | ICD-10-CM | POA: Insufficient documentation

## 2022-03-01 DIAGNOSIS — M879 Osteonecrosis, unspecified: Secondary | ICD-10-CM | POA: Diagnosis present

## 2022-03-01 DIAGNOSIS — Z923 Personal history of irradiation: Secondary | ICD-10-CM | POA: Diagnosis not present

## 2022-03-01 DIAGNOSIS — Z85830 Personal history of malignant neoplasm of bone: Secondary | ICD-10-CM | POA: Insufficient documentation

## 2022-03-01 DIAGNOSIS — Z85810 Personal history of malignant neoplasm of tongue: Secondary | ICD-10-CM | POA: Diagnosis not present

## 2022-03-01 DIAGNOSIS — C049 Malignant neoplasm of floor of mouth, unspecified: Secondary | ICD-10-CM | POA: Diagnosis not present

## 2022-03-01 NOTE — Progress Notes (Addendum)
Theresa, Dillon ([id number]) Visit Report for 03/01/2022 Arrival Information Details Patient Name: Date of Service: Theresa LLA Theresa Alexander D. 03/01/2022 9:30 A M Medical Record Number: [id number] Patient Account Number: [id number] Date of Birth/Sex: Treating RN: 1960/09/28 (61 y.o. [name], [name] Primary Care [name]: [name] Other Clinician: [name] Referring Tanis Hensarling: Treating Ryott Rafferty/Extender: Mathews Argyle in Treatment: 25 Visit Information History Since Last Visit All ordered tests and consults were completed: Yes Patient Arrived: Ambulatory Added or deleted any medications: No Arrival Time: 12:43 Any new allergies or adverse reactions: No Accompanied By: None Had a fall or experienced change in No Transfer Assistance: None activities of daily living that may affect Patient Identification Verified: Yes risk of falls: Secondary Verification Process Completed: Yes Signs or symptoms of abuse/neglect since last visito No Patient Requires Transmission-Based Precautions: No Hospitalized since last visit: No Patient Has Alerts: No Implantable device outside of the clinic excluding No cellular tissue based products placed in the center since last visit: Pain Present Now: No Electronic Signature(s) Signed: 03/01/2022 12:46:19 PM By: [name] EMT Entered By: [name] on 03/01/2022 12:46:18 -------------------------------------------------------------------------------- Encounter Discharge Information Details Patient Name: Date of Service: Theresa LLA Theresa Alexander D. 03/01/2022 9:30 A M Medical Record Number: [id number] Patient Account Number: [id number] Date of Birth/Sex: Treating RN: 11-19-1960 (61 y.o. [name] Primary Care Rian Koon: [name] Other Clinician: [name] Referring Myya Meenach: Treating Ezzard Ditmer/Extender: Mathews Argyle in Treatment: 16 Encounter Discharge Information  Items Discharge Condition: Stable Ambulatory Status: Ambulatory Discharge Destination: Home Transportation: Private Auto Accompanied By: None Schedule Follow-up Appointment: Yes Clinical Summary of Care: Electronic Signature(s) Signed: 03/01/2022 12:50:43 PM By: [name] EMT Entered By: [name] on 03/01/2022 12:50:43 -------------------------------------------------------------------------------- Vitals Details Patient Name: Date of Service: Theresa LLA Theresa Alexander D. 03/01/2022 9:30 A M Medical Record Number: [id number] Patient Account Number: [id number] Date of Birth/Sex: Treating RN: 1961/05/23 (61 y.o. [name], [name] Primary Care Breeona Waid: [name] Other Clinician: [name] Referring Kolby Schara: Treating Moroni Nester/Extender: [name] Weeks in Treatment: 16 Vital Signs Time Taken: 09:57 Temperature (F): 98.0 Height (in): 60 Pulse (bpm): 84 Weight (lbs): 88 Respiratory Rate (breaths/min): 16 Body Mass Index (BMI): 17.2 Blood Pressure (mmHg): 112/77 Reference Range: 80 - 120 mg / dl Electronic Signature(s) Signed: 03/01/2022 12:46:47 PM By: [name] EMT Entered By: [name] on 03/01/2022 00:92:33

## 2022-03-01 NOTE — Progress Notes (Signed)
[name], [name] ([id number]) Visit Report for 03/01/2022 Problem List Details Patient Name: Date of Service: [name] 03/01/2022 9:30 A M Medical Record Number: [id number] Patient Account Number: [id number] Date of Birth/Sex: Treating RN: 04-Sep-1960 (61 y.o. 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 03/01/2022 Medical Record Number: [id number] Patient Account Number: [id number] Date of Birth/Sex: Treating RN: Dec 25, 1960 (61 y.o. 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

## 2022-03-01 NOTE — Progress Notes (Addendum)
DODY, SMARTT ([id number]) Visit Report for 03/01/2022 HBO Details Patient Name: Date of Service: [name] 03/01/2022 9:30 A M Medical Record Number: [id number] Patient Account Number: [id number] Date of Birth/Sex: Treating RN: 11-14-1960 (61 y.o. 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, Jaw HBO Treatment Details Treatment Number: 74 Patient Type: Outpatient Chamber Type: Monoplace Chamber Serial #: [id number] Treatment Protocol: 2.5 ATA with 90 minutes oxygen, with two 5 minute air breaks Treatment Details Compression Rate Down: 2.0 psi / minute De-Compression Rate Up: 2.0 psi / minute A breaks and breathing ir Compress Tx Pressure periods Decompress Decompress Begins Reached (leave unused spaces Begins Ends blank) Chamber Pressure (ATA 1 2.5 2.5 2.5 2.5 2.5 - - 2.5 1 ) Clock Time (24 hr) 10:08 10:20 10:51 10:56 11:25 10:31 - - 12:01 12:12 Treatment Length: 124 (minutes) Treatment Segments: 4 Vital Signs Capillary Blood Glucose Reference Range: 80 - 120 mg / dl HBO Diabetic Blood Glucose Intervention Range: <131 mg/dl or >249 mg/dl Time Vitals Blood Respiratory Capillary Blood Glucose Pulse Action Type: Pulse: Temperature: Taken: Pressure: Rate: Glucose (mg/dl): Meter #: Oximetry (%) Taken: Pre 09:57 112/77 84 16 98 Post 12:15 121/88 60 16 98.1 Treatment Response Treatment Toleration: Well Treatment Completion Status: Treatment Completed without Adverse Event Physician HBO Attestation: I certify that I supervised this HBO treatment in accordance with Medicare guidelines. A trained emergency  response team is readily available per Yes hospital policies and procedures. Continue HBOT as ordered. Yes Electronic Signature(s) Signed: 03/01/2022 1:40:11 PM By: [name] MD FACS Previous Signature: 03/01/2022 12:49:36 PM Version By: [name] EMT Entered By: [name] on 03/01/2022 13:40:11 -------------------------------------------------------------------------------- HBO Safety Checklist Details Patient Name: Date of Service: [name] 03/01/2022 9:30 A M Medical Record Number: [id number] Patient Account Number: [id number] Date of Birth/Sex: Treating RN: 1960/10/15 (61 y.o. Iver Nestle, [place] Primary Care Zeenat [name]: [name] Other Clinician: [name] Referring Charnee Turnipseed: Treating Oney Folz/Extender: [name] Weeks in Treatment: 16 HBO Safety Checklist Items Safety Checklist Consent Form Signed Patient voided / foley secured and emptied When did you last eato 0830 Last dose of injectable or oral agent NA Ostomy pouch emptied and vented if applicable NA All implantable devices assessed, documented and approved NA Intravenous access site secured and place NA Valuables secured Linens and cotton and cotton/polyester blend (less than 51% polyester) Personal oil-based products / skin lotions / body lotions removed Wigs or hairpieces removed NA Smoking or tobacco materials removed Books / newspapers / magazines / loose paper removed Cologne, aftershave, perfume and deodorant removed Jewelry removed (may wrap wedding band) NA Make-up removed Hair care products removed Battery operated devices (external) removed Heating patches and chemical warmers removed Titanium eyewear removed NA Nail polish cured greater than 10 hours Casting material cured greater than 10 hours NA Hearing aids removed NA Loose dentures or partials removed NA Prosthetics have been removed NA Patient demonstrates correct use of air  break device (if applicable) Patient concerns have been addressed Patient grounding bracelet on and cord attached to chamber Specifics for Inpatients (complete in addition to above) Medication sheet sent with patient NA Intravenous medications needed or due during therapy sent with patient NA  Drainage tubes (e.g. nasogastric tube or chest tube secured and vented) NA Endotracheal or Tracheotomy tube secured NA Cuff deflated of air and inflated with saline NA Airway suctioned NA Notes The safety checklist was done before the treatment was started. Electronic Signature(s) Signed: 03/01/2022 12:48:20 PM By: [name] EMT Entered By: [name] on 03/01/2022 12:48:20

## 2022-03-05 ENCOUNTER — Encounter (HOSPITAL_BASED_OUTPATIENT_CLINIC_OR_DEPARTMENT_OTHER): Payer: Medicare Other | Admitting: Internal Medicine

## 2022-03-05 DIAGNOSIS — M8788 Other osteonecrosis, other site: Secondary | ICD-10-CM | POA: Diagnosis not present

## 2022-03-05 DIAGNOSIS — C411 Malignant neoplasm of mandible: Secondary | ICD-10-CM

## 2022-03-05 DIAGNOSIS — Z923 Personal history of irradiation: Secondary | ICD-10-CM

## 2022-03-05 DIAGNOSIS — M879 Osteonecrosis, unspecified: Secondary | ICD-10-CM | POA: Diagnosis not present

## 2022-03-05 DIAGNOSIS — M272 Inflammatory conditions of jaws: Secondary | ICD-10-CM

## 2022-03-05 NOTE — Progress Notes (Signed)
[name], [name] ([id number]) Visit Report for 03/05/2022 Problem List Details Patient Name: Date of Service: Theresa [name] 03/05/2022 1:00 PM Medical Record Number: [id number] Patient Account Number: [id number] Date of Birth/Sex: Treating RN: 10/06/1960 (61 y.o. 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 03/05/2022 Medical Record Number: [id number] Patient Account Number: [id number] Date of Birth/Sex: Treating RN: 18-Jul-1960 (61 y.o. 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

## 2022-03-05 NOTE — Progress Notes (Signed)
MELINA, MOSTELLER ([id number]) Visit Report for 03/05/2022 HBO Details Patient Name: Date of Service: [name] 03/05/2022 1:00 PM Medical Record Number: [id number] Patient Account Number: [id number] Date of Birth/Sex: Treating RN: [birth year] (61 y.o. 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, Jaw HBO Treatment Details Treatment Number: 75 Patient Type: Outpatient Chamber Type: Monoplace Chamber Serial #: [id number] Treatment Protocol: 2.5 ATA with 90 minutes oxygen, with two 5 minute air breaks Treatment Details Compression Rate Down: 2.0 psi / minute De-Compression Rate Up: 2.0 psi / minute A breaks and breathing ir Compress Tx Pressure periods Decompress Decompress Begins Reached (leave unused spaces Begins Ends blank) Chamber Pressure (ATA 1 2.5 2.5 2.5 2.5 2.5 - - 2.5 1 ) Clock Time (24 hr) 12:39 12:50 13:20 13:25 13:55 14:00 - - 14:30 14:41 Treatment Length: 122 (minutes) Treatment Segments: 4 Vital Signs Capillary Blood Glucose Reference Range: 80 - 120 mg / dl HBO Diabetic Blood Glucose Intervention Range: <131 mg/dl or >249 mg/dl Time Vitals Blood Respiratory Capillary Blood Glucose Pulse Action Type: Pulse: Temperature: Taken: Pressure: Rate: Glucose (mg/dl): Meter #: Oximetry (%) Taken: Pre 12:31 106/68 87 14 98.3 Post 14:43 108/81 65 16 98.2 Treatment Response Treatment Toleration: Well Treatment Completion Status: Treatment Completed without Adverse Event Physician HBO Attestation: I certify that I supervised this HBO treatment in accordance with Medicare guidelines. A trained  emergency response team is readily available per Yes hospital policies and procedures. Continue HBOT as ordered. Yes Electronic Signature(s) Signed: 03/05/2022 4:32:10 PM By: [name] DO Previous Signature: 03/05/2022 4:29:10 PM Version By: [name] EMT Entered By: [name] on 03/05/2022 16:31:43 -------------------------------------------------------------------------------- HBO Safety Checklist Details Patient Name: Date of Service: [name] 03/05/2022 1:00 PM Medical Record Number: [id number] Patient Account Number: [id number] Date of Birth/Sex: Treating RN: [birth year] (61 y.o. [name], [name] Primary Care Jolanta Cabeza: [name] Other Clinician: [name] Referring Prima Rayner: Treating Tyquan Carmickle/Extender: [name] Weeks in Treatment: 17 HBO Safety Checklist Items Safety Checklist Consent Form Signed Patient voided / foley secured and emptied When did you last eato 1100 Last dose of injectable or oral agent NA Ostomy pouch emptied and vented if applicable NA All implantable devices assessed, documented and approved NA Intravenous access site secured and place NA Valuables secured Linens and cotton and cotton/polyester blend (less than 51% polyester) Personal oil-based products / skin lotions / body lotions removed Wigs or hairpieces removed NA Smoking or tobacco materials removed Books / newspapers / magazines / loose paper removed Cologne, aftershave, perfume and deodorant removed Jewelry removed (may wrap wedding band) NA Make-up removed Hair care products removed Battery operated devices (external) removed Heating patches and chemical warmers removed Titanium eyewear removed NA Nail polish cured greater than 10 hours Casting material cured greater than 10 hours NA Hearing aids removed NA Loose dentures or partials removed NA Prosthetics have been removed NA Patient demonstrates correct use of  air break device (if applicable) Patient concerns have been addressed Patient grounding bracelet on and cord attached to chamber Specifics for Inpatients (complete in addition to above) Medication sheet sent with patient NA Intravenous medications needed or due during therapy sent with patient NA Drainage tubes (e.g.  nasogastric tube or chest tube secured and vented) NA Endotracheal or Tracheotomy tube secured NA Cuff deflated of air and inflated with saline NA Airway suctioned NA Notes The safety checklist was done before the treatment was started Electronic Signature(s) Signed: 03/05/2022 4:27:29 PM By: [name] EMT Entered By: [name] on 03/05/2022 16:27:29

## 2022-03-05 NOTE — Progress Notes (Signed)
[name] ([id number]) Visit Report for 03/05/2022 Arrival Information Details Patient Name: Date of Service: Theresa LLA [name] 03/05/2022 1:00 PM Medical Record Number: [id number] Patient Account Number: [id number] Date of Birth/Sex: Treating RN: Dec 07, 1960 (61 y.o. [name] Primary Care [name]: [name] Other Clinician: [name] Referring Yenty Bloch: Treating Tiarra Anastacio/Extender: [name] in Treatment: 98 Visit Information History Since Last Visit All ordered tests and consults were completed: Yes Patient Arrived: Ambulatory Added or deleted any medications: No Arrival Time: 12:24 Any new allergies or adverse reactions: No Accompanied By: None Had a fall or experienced change in No Transfer Assistance: None activities of daily living that may affect Patient Identification Verified: Yes risk of falls: Secondary Verification Process Completed: Yes Signs or symptoms of abuse/neglect since last visito No Patient Requires Transmission-Based Precautions: No Hospitalized since last visit: No Patient Has Alerts: No Implantable device outside of the clinic excluding No cellular tissue based products placed in the center since last visit: Pain Present Now: No Electronic Signature(s) Signed: 03/05/2022 4:23:19 PM By: [name] EMT Entered By: [name] on 03/05/2022 16:23:19 -------------------------------------------------------------------------------- Encounter Discharge Information Details Patient Name: Date of Service: Theresa LLA HA Theresa Bienenstock D. 03/05/2022 1:00 PM Medical Record Number: [id number] Patient Account Number: [id number] Date of Birth/Sex: Treating RN: 08/04/1960 (61 y.o. [name] Primary Care Lilyannah Zuelke: [name] Other Clinician: [name] Referring [name]: Treating [name]/Extender: [name] in Treatment: 17 Encounter Discharge Information  Items Discharge Condition: Stable Ambulatory Status: Ambulatory Discharge Destination: Home Transportation: Private Auto Accompanied By: None Schedule Follow-up Appointment: Yes Clinical Summary of Care: Electronic Signature(s) Signed: 03/05/2022 4:30:09 PM By: [name] EMT Entered By: [name] on 03/05/2022 16:30:09 -------------------------------------------------------------------------------- Vitals Details Patient Name: Date of Service: Theresa LLA [name] 03/05/2022 1:00 PM Medical Record Number: [id number] Patient Account Number: [id number] Date of Birth/Sex: Treating RN: July 07, 1960 (61 y.o. [name] Primary Care Roverto Bodmer: [name] Other Clinician: [name] Referring [name]: Treating [name]/Extender: [name] Weeks in Treatment: 17 Vital Signs Time Taken: 12:31 Temperature (F): 98.3 Height (in): 60 Pulse (bpm): 87 Weight (lbs): 88 Respiratory Rate (breaths/min): 14 Body Mass Index (BMI): 17.2 Blood Pressure (mmHg): 106/68 Reference Range: 80 - 120 mg / dl Electronic Signature(s) Signed: 03/05/2022 4:24:28 PM By: [name] EMT Entered By: [name] on 03/05/2022 16:24:28

## 2022-03-06 ENCOUNTER — Encounter (HOSPITAL_BASED_OUTPATIENT_CLINIC_OR_DEPARTMENT_OTHER): Payer: Medicare Other | Admitting: General Surgery

## 2022-03-06 DIAGNOSIS — M879 Osteonecrosis, unspecified: Secondary | ICD-10-CM | POA: Diagnosis not present

## 2022-03-06 NOTE — Progress Notes (Addendum)
Dillon, Theresa ([id number]) Visit Report for 03/06/2022 Arrival Information Details Patient Name: Date of Service: [name] 03/06/2022 1:15 PM Medical Record Number: [id number] Patient Account Number: [id number] Date of Birth/Sex: Treating RN: February 12, 1961 (61 y.o. [name], [name] Primary Care Jamiere Gulas: [name] Other Clinician: Donavan Burnet Referring Laberta Wilbon: Treating Kiyah Demartini/Extender: Mathews Argyle in Treatment: 33 Visit Information History Since Last Visit All ordered tests and consults were completed: Yes Patient Arrived: Ambulatory Added or deleted any medications: No Arrival Time: 12:17 Any new allergies or adverse reactions: No Accompanied By: self Had a fall or experienced change in No Transfer Assistance: None activities of daily living that may affect Patient Identification Verified: Yes risk of falls: Secondary Verification Process Completed: Yes Signs or symptoms of abuse/neglect since last visito No Patient Requires Transmission-Based Precautions: No Hospitalized since last visit: No Patient Has Alerts: No Implantable device outside of the clinic excluding No cellular tissue based products placed in the center since last visit: Pain Present Now: No Electronic Signature(s) Signed: 03/06/2022 3:19:20 PM By: Donavan Burnet CHT EMT BS , , Entered By: Donavan Burnet on 03/06/2022 15:19:20 -------------------------------------------------------------------------------- Encounter Discharge Information Details Patient Name: Date of Service: [name] 03/06/2022 1:15 PM Medical Record Number: [id number] Patient Account Number: [id number] Date of Birth/Sex: Treating RN: 11-08-1960 (61 y.o. [name], [name] Primary Care Jamin Panther: [name] Other Clinician: Donavan Burnet Referring Sam Wunschel: Treating Sara Keys/Extender: Mathews Argyle in Treatment:  17 Encounter Discharge Information Items Discharge Condition: Stable Ambulatory Status: Ambulatory Discharge Destination: Home Transportation: Private Auto Accompanied By: self Schedule Follow-up Appointment: No Clinical Summary of Care: Electronic Signature(s) Signed: 03/06/2022 4:53:14 PM By: Donavan Burnet CHT EMT BS , , Entered By: Donavan Burnet on 03/06/2022 16:53:14 -------------------------------------------------------------------------------- Vitals Details Patient Name: Date of Service: [name] 03/06/2022 1:15 PM Medical Record Number: [id number] Patient Account Number: [id number] Date of Birth/Sex: Treating RN: 1960/11/16 (61 y.o. [name], [name] Primary Care Iridiana Fonner: [name] Other Clinician: Donavan Burnet Referring Leza Apsey: Treating Valecia Beske/Extender: Mathews Argyle in Treatment: 17 Vital Signs Time Taken: 12:53 Temperature (F): 97.8 Height (in): 60 Pulse (bpm): 75 Weight (lbs): 88 Respiratory Rate (breaths/min): 18 Body Mass Index (BMI): 17.2 Blood Pressure (mmHg): 127/81 Reference Range: 80 - 120 mg / dl Electronic Signature(s) Signed: 03/06/2022 3:20:29 PM By: Donavan Burnet CHT EMT BS , , Entered By: Donavan Burnet on 03/06/2022 15:20:29

## 2022-03-06 NOTE — Progress Notes (Signed)
SINA, SUMPTER ([id number]) Visit Report for 03/06/2022 Arrival Information Details Patient Name: Date of Service: [name] Theresa Alexander D. 03/06/2022 12:30 PM Medical Record Number: [id number] Patient Account Number: [id number] Date of Birth/Sex: Treating RN: [name] (61 y.o. [name], Theresa Primary Care [name]: [name] Other Clinician: Referring [name]: Treating [name]/Extender: [name] in Treatment: 1 Visit Information History Since Last Visit Added or deleted any medications: No Patient Arrived: Ambulatory Any new allergies or adverse reactions: No Arrival Time: 12:53 Had a fall or experienced change in No Accompanied By: self activities of daily living that may affect Transfer Assistance: None risk of falls: Patient Identification Verified: Yes Signs or symptoms of abuse/neglect since last visito No Secondary Verification Process Completed: Yes Hospitalized since last visit: No Patient Requires Transmission-Based Precautions: No Implantable device outside of the clinic excluding No Patient Has Alerts: No cellular tissue based products placed in the center since last visit: Pain Present Now: Yes Electronic Signature(s) Signed: 03/06/2022 5:54:13 PM By: Baruch Gouty RN, BSN Entered By: Baruch Gouty on 03/06/2022 12:53:44 -------------------------------------------------------------------------------- Clinic Level of Care Assessment Details Patient Name: Date of Service: [name] Theresa Bienenstock D. 03/06/2022 12:30 PM Medical Record Number: [id number] Patient Account Number: [id number] Date of Birth/Sex: Treating RN: [birth year] (61 y.o. [name] Primary Care [name]: [name] Other Clinician: Referring Adonte Vanriper: Treating Candelaria Pies/Extender: [name] in Treatment: 17 Clinic Level of Care Assessment Items TOOL 4 Quantity Score '[]'$  - 0 Use when only an EandM is performed on  FOLLOW-UP visit ASSESSMENTS - Nursing Assessment / Reassessment X- 1 10 Reassessment of Co-morbidities (includes updates in patient status) X- 1 5 Reassessment of Adherence to Treatment Plan ASSESSMENTS - Wound and Skin A ssessment / Reassessment '[]'$  - 0 Simple Wound Assessment / Reassessment - one wound '[]'$  - 0 Complex Wound Assessment / Reassessment - multiple wounds '[]'$  - 0 Dermatologic / Skin Assessment (not related to wound area) ASSESSMENTS - Focused Assessment '[]'$  - 0 Circumferential Edema Measurements - multi extremities '[]'$  - 0 Nutritional Assessment / Counseling / Intervention '[]'$  - 0 Lower Extremity Assessment (monofilament, tuning fork, pulses) '[]'$  - 0 Peripheral Arterial Disease Assessment (using hand held doppler) ASSESSMENTS - Ostomy and/or Continence Assessment and Care '[]'$  - 0 Incontinence Assessment and Management '[]'$  - 0 Ostomy Care Assessment and Management (repouching, etc.) PROCESS - Coordination of Care X - Simple Patient / Family Education for ongoing care 1 15 '[]'$  - 0 Complex (extensive) Patient / Family Education for ongoing care X- 1 10 Staff obtains Programmer, systems, Records, T Results / Process Orders est '[]'$  - 0 Staff telephones HHA, Nursing Homes / Clarify orders / etc '[]'$  - 0 Routine Transfer to another Facility (non-emergent condition) '[]'$  - 0 Routine Hospital Admission (non-emergent condition) '[]'$  - 0 New Admissions / Biomedical engineer / Ordering NPWT Apligraf, etc. , '[]'$  - 0 Emergency Hospital Admission (emergent condition) X- 1 10 Simple Discharge Coordination '[]'$  - 0 Complex (extensive) Discharge Coordination PROCESS - Special Needs '[]'$  - 0 Pediatric / Minor Patient Management '[]'$  - 0 Isolation Patient Management '[]'$  - 0 Hearing / Language / Visual special needs '[]'$  - 0 Assessment of Community assistance (transportation, D/C planning, etc.) '[]'$  - 0 Additional assistance / Altered mentation '[]'$  - 0 Support Surface(s) Assessment (bed, cushion,  seat, etc.) INTERVENTIONS - Wound Cleansing / Measurement '[]'$  - 0 Simple Wound Cleansing - one wound '[]'$  - 0 Complex Wound Cleansing - multiple wounds '[]'$  - 0  Wound Imaging (photographs - any number of wounds) '[]'$  - 0 Wound Tracing (instead of photographs) '[]'$  - 0 Simple Wound Measurement - one wound '[]'$  - 0 Complex Wound Measurement - multiple wounds INTERVENTIONS - Wound Dressings '[]'$  - 0 Small Wound Dressing one or multiple wounds '[]'$  - 0 Medium Wound Dressing one or multiple wounds '[]'$  - 0 Large Wound Dressing one or multiple wounds '[]'$  - 0 Application of Medications - topical '[]'$  - 0 Application of Medications - injection INTERVENTIONS - Miscellaneous '[]'$  - 0 External ear exam '[]'$  - 0 Specimen Collection (cultures, biopsies, blood, body fluids, etc.) '[]'$  - 0 Specimen(s) / Culture(s) sent or taken to Lab for analysis '[]'$  - 0 Patient Transfer (multiple staff / Civil Service fast streamer / Similar devices) '[]'$  - 0 Simple Staple / Suture removal (25 or less) '[]'$  - 0 Complex Staple / Suture removal (26 or more) '[]'$  - 0 Hypo / Hyperglycemic Management (close monitor of Blood Glucose) '[]'$  - 0 Ankle / Brachial Index (ABI) - do not check if billed separately X- 1 5 Vital Signs Has the patient been seen at the hospital within the last three years: Yes Total Score: 55 Level Of Care: New/Established - Level 2 Electronic Signature(s) Signed: 03/06/2022 5:54:13 PM By: Baruch Gouty RN, BSN Entered By: Baruch Gouty on 03/06/2022 13:06:08 -------------------------------------------------------------------------------- Encounter Discharge Information Details Patient Name: Date of Service: [name] Theresa Bienenstock D. 03/06/2022 12:30 PM Medical Record Number: [id number] Patient Account Number: [id number] Date of Birth/Sex: Treating RN: [birth year] (61 y.o. [name] Primary Care [name]: [name] Other Clinician: Referring [name]: Treating [name]/Extender: [name] in Treatment: 17 Encounter Discharge Information Items Discharge Condition: Stable Ambulatory Status: Ambulatory Discharge Destination: Home Transportation: Private Auto Accompanied By: self Schedule Follow-up Appointment: Yes Clinical Summary of Care: Patient Declined Electronic Signature(s) Signed: 03/06/2022 5:54:13 PM By: Baruch Gouty RN, BSN Entered By: Baruch Gouty on 03/06/2022 13:11:24 -------------------------------------------------------------------------------- Lower Extremity Assessment Details Patient Name: Date of Service: [name] Theresa Bienenstock D. 03/06/2022 12:30 PM Medical Record Number: [id number] Patient Account Number: [id number] Date of Birth/Sex: Treating RN: [birth year] (61 y.o. [name] Primary Care Nguyet Mercer: [name] Other Clinician: Referring Ramar Nobrega: Treating Kyria Bumgardner/Extender: [name] in Treatment: 17 Electronic Signature(s) Signed: 03/06/2022 5:54:13 PM By: Baruch Gouty RN, BSN Entered By: Baruch Gouty on 03/06/2022 12:55:59 -------------------------------------------------------------------------------- Multi Wound Chart Details Patient Name: Date of Service: [name] Theresa Bienenstock D. 03/06/2022 12:30 PM Medical Record Number: [id number] Patient Account Number: [id number] Date of Birth/Sex: Treating RN: [birth year] (61 y.o. F) Primary Care Keynan Heffern: Other Clinician: Rollen Dillon Referring Jayln Madeira: Treating Ivon Oelkers/Extender: [name] in Treatment: 17 Vital Signs Height(in): 60 Pulse(bpm): 75 Weight(lbs): 88 Blood Pressure(mmHg): 127/81 Body Mass Index(BMI): 17.2 Temperature(F): 97.8 Respiratory Rate(breaths/min): 18 Wound Assessments Treatment Notes Electronic Signature(s) Signed: 03/06/2022 1:10:49 PM By: [name] MD FACS Entered By: [name] on 03/06/2022  13:10:49 -------------------------------------------------------------------------------- Multi-Disciplinary Care Plan Details Patient Name: Date of Service: [name] Theresa Alexander D. 03/06/2022 12:30 PM Medical Record Number: [id number] Patient Account Number: [id number] Date of Birth/Sex: Treating RN: [birth year] Dillon (61 y.o. 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  Discomfort related to temperature and humidity changes inside hyperbaric chamber Potential for barotraumas to ears, sinuses, teeth, and lungs or cerebral gas embolism related to changes in atmospheric pressure inside hyperbaric oxygen chamber Potential for oxygen toxicity seizures related to delivery of 100% oxygen at an increased atmospheric pressure Potential for pulmonary oxygen toxicity related to delivery of 100% oxygen at an increased atmospheric pressure Goals: Barotrauma will be prevented during HBO2 Date Initiated: 11/05/2021 T arget Resolution Date: 03/29/2022 Goal Status: Active Patient and/or family will be able to state/discuss factors appropriate to the management of their disease process during treatment Date Initiated: 11/05/2021 T arget Resolution Date: 03/29/2022 Goal Status: Active Patient will tolerate the hyperbaric oxygen therapy treatment Date Initiated: 11/05/2021 T arget Resolution Date: 03/29/2022 Goal Status: Active Patient will tolerate the internal climate of the chamber Date Initiated: 11/05/2021 T arget Resolution Date: 03/29/2022 Goal Status: Active Patient/caregiver will verbalize understanding  of HBO goals, rationale, procedures and potential hazards Date Initiated: 11/05/2021 T arget Resolution Date: 03/29/2022 Goal Status: Active Signs and symptoms of pulmonary oxygen toxicity will be recognized and promptly addressed Date Initiated: 11/05/2021 T arget Resolution Date: 03/29/2022 Goal Status: Active Signs and symptoms of seizure will be recognized and promptly addressed ; seizing patients will suffer no harm Date Initiated: 11/05/2021 Target Resolution Date: 03/29/2022 Goal Status: Active Interventions: Administer a five (5) minute air break for patient if signs and symptoms of seizure appear and notify the hyperbaric physician Administer the correct therapeutic gas delivery based on the patients needs and limitations, per physician order Assess and provide for patients comfort related to the hyperbaric environment and equalization of middle ear Assess for signs and symptoms related to adverse events, including but not limited to confinement anxiety, pneumothorax, oxygen toxicity and baurotrauma Assess patient for any history of confinement anxiety Assess patient's knowledge and expectations regarding hyperbaric medicine and provide education related to the hyperbaric environment, goals of treatment and prevention of adverse events Implement protocols to decrease risk of pneumothorax in high risk patients Notes: Electronic Signature(s) Signed: 03/06/2022 5:54:13 PM By: Baruch Gouty RN, BSN Entered By: Baruch Gouty on 03/06/2022 13:02:18 -------------------------------------------------------------------------------- Pain Assessment Details Patient Name: Date of Service: [name] Theresa Alexander D. 03/06/2022 12:30 PM Medical Record Number: [id number] Patient Account Number: [id number] Date of Birth/Sex: Treating RN: [birth year] (61 y.o. [name] Primary Care Anuel Sitter: [name] Other Clinician: Referring Kynnedi Zweig: Treating Araina Butrick/Extender: [name] in Treatment: 17 Active Problems Location of Pain Severity and Description of Pain Patient Has Paino Yes Site Locations Pain Location: Pain in Ulcers Duration of the Pain. Constant / Intermittento Constant Rate the pain. Current Pain Level: 4 Worst Pain Level: 6 Character of Pain Describe the Pain: Aching Pain Management and Medication Current Pain Management: Medication: Yes Is the Current Pain Management Adequate: Adequate How does your wound impact your activities of daily livingo Sleep: No Bathing: No Appetite: No Relationship With Others: No Bladder Continence: No Emotions: No Bowel Continence: No Work: No Toileting: No Drive: No Dressing: No Hobbies: No Electronic Signature(s) Signed: 03/06/2022 5:54:13 PM By: Baruch Gouty RN, BSN Entered By: Baruch Gouty on 03/06/2022 12:55:50 -------------------------------------------------------------------------------- Patient/Caregiver Education Details Patient Name: Date of Service: [name] Theresa Bienenstock D. 9/6/2023andnbsp12:30 PM Medical Record Number: [id number] Patient Account Number: [id number] Date of Birth/Gender: Treating RN: [birth year] (61 y.o. [name] Primary Care Physician: [name] Other Clinician: Referring Physician: Treating Physician/Extender: [name] in Treatment: 17 Education Assessment  Education Provided To: Patient Education Topics Provided Hyperbaric Oxygenation: Methods: Explain/Verbal Responses: Reinforcements needed, State content correctly Wound/Skin Impairment: Electronic Signature(s) Signed: 03/06/2022 5:54:13 PM By: Baruch Gouty RN, BSN Entered By: Baruch Gouty on 03/06/2022 13:04:41 -------------------------------------------------------------------------------- [place] Details Patient Name: Date of Service: [name] Theresa Alexander D. 03/06/2022 12:30 PM Medical Record Number: [id number] Patient  Account Number: [id number] Date of Birth/Sex: Treating RN: [birth year] (61 y.o. [name] Primary Care Reilly Blades: [name] Other Clinician: Referring Cleaster Shiffer: Treating Dillon Quincy/Extender: [name] in Treatment: 17 Vital Signs Time Taken: 12:53 Temperature (F): 97.8 Height (in): 60 Pulse (bpm): 75 Weight (lbs): 88 Respiratory Rate (breaths/min): 18 Body Mass Index (BMI): 17.2 Blood Pressure (mmHg): 127/81 Reference Range: 80 - 120 mg / dl Electronic Signature(s) Signed: 03/06/2022 5:54:13 PM By: Baruch Gouty RN, BSN Entered By: Baruch Gouty on 03/06/2022 12:54:13

## 2022-03-06 NOTE — Progress Notes (Addendum)
Theresa, Dillon ([id number]) Visit Report for 03/06/2022 HBO Details Patient Name: Date of Service: [name] 03/06/2022 1:15 PM Medical Record Number: [id number] Patient Account Number: [id number] Date of Birth/Sex: Treating RN: 1960/12/20 (61 y.o. 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, Jaw HBO Treatment Details Treatment Number: 76 Patient Type: Outpatient Chamber Type: Monoplace Chamber Serial #: [id number] Treatment Protocol: 2.5 ATA with 90 minutes oxygen, with two 5 minute air breaks Treatment Details Compression Rate Down: 2.0 psi / minute De-Compression Rate Up: 2.0 psi / minute A breaks and breathing ir Compress Tx Pressure periods Decompress Decompress Begins Reached (leave unused spaces Begins Ends blank) Chamber Pressure (ATA 1 2.5 2.5 2.5 2.5 2.5 - - 2.5 1 ) Clock Time (24 hr) 13:27 13:40 14:10 14:15 14:45 14:50 - - 15:20 15:31 Treatment Length: 124 (minutes) Treatment Segments: 4 Vital Signs Capillary Blood Glucose Reference Range: 80 - 120 mg / dl HBO Diabetic Blood Glucose Intervention Range: <131 mg/dl or >249 mg/dl Type: Time Vitals Blood Respiratory Capillary Blood Glucose Pulse Action Pulse: Temperature: Taken: Pressure: Rate: Glucose (mg/dl): Meter #: Oximetry (%) Taken: Pre 12:53 127/81 75 18 97.8 none per protocol Post 15:35 105/71 58 16 97.3 none per protocol Treatment Response Treatment Toleration: Well Treatment Completion Status: Treatment Completed without Adverse Event Physician HBO Attestation: I certify that I supervised this HBO treatment in accordance  with Medicare guidelines. A trained emergency response team is readily available per Yes hospital policies and procedures. Continue HBOT as ordered. Yes Electronic Signature(s) Signed: 03/06/2022 4:52:07 PM By: [name] MD FACS Previous Signature: 03/06/2022 4:47:52 PM Version By: Donavan Burnet CHT EMT BS , , Entered By: [name] on 03/06/2022 16:52:07 -------------------------------------------------------------------------------- HBO Safety Checklist Details Patient Name: Date of Service: Theresa [address] [name] 03/06/2022 1:15 PM Medical Record Number: [id number] Patient Account Number: [id number] Date of Birth/Sex: Treating RN: 15-Jun-1961 (61 y.o. [name], [name] Primary Care Tinlee Navarrette: [name] Other Clinician: Donavan Burnet Referring Landyn Lorincz: Treating Abelino Tippin/Extender: [name] Weeks in Treatment: 17 HBO Safety Checklist Items Safety Checklist Consent Form Signed Patient voided / foley secured and emptied When did you last eato 0900 Last dose of injectable or oral agent n/a Ostomy pouch emptied and vented if applicable NA All implantable devices assessed, documented and approved port Intravenous access site secured and place NA Valuables secured Linens and cotton and cotton/polyester blend (less than 51% polyester) Personal oil-based products / skin lotions / body lotions removed Wigs or hairpieces removed NA Smoking or tobacco materials removed NA Books / newspapers / magazines / loose paper removed Cologne, aftershave, perfume and deodorant removed Jewelry removed (may wrap wedding band) Make-up removed Hair care products removed Battery operated devices (external) removed Heating patches and chemical warmers removed Titanium eyewear removed NA Nail polish cured greater than 10 hours Casting material cured greater than 10 hours NA Hearing aids removed NA Loose dentures or partials  removed NA Prosthetics have been removed NA Patient demonstrates correct use of air break device (if applicable) Patient concerns have been addressed Patient grounding bracelet on and cord attached to chamber Specifics for Inpatients (complete in addition to above) Medication sheet sent with patient NA Intravenous medications needed  or due during therapy sent with patient NA Drainage tubes (e.g. nasogastric tube or chest tube secured and vented) NA Endotracheal or Tracheotomy tube secured NA Cuff deflated of air and inflated with saline NA Airway suctioned NA Notes Paper version used prior to treatment start. Electronic Signature(s) Signed: 03/06/2022 3:23:51 PM By: Donavan Burnet CHT EMT BS , , Entered By: Donavan Burnet on 03/06/2022 15:23:50

## 2022-03-06 NOTE — Progress Notes (Signed)
[name], [name] ([id number]) Visit Report for 03/06/2022 Chief Complaint Document Details Patient Name: Date of Service: [name] 03/06/2022 12:30 PM Medical Record Number: [id number] Patient Account Number: [id number] Date of Birth/Sex: Treating RN: 1961/04/11 (61 y.o. 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 03/06/2022 12:30 PM Medical Record Number: [id number] Patient Account Number: [id number] Date of Birth/Sex: Treating RN: 1961/02/23 (61 y.o. 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 She underwent resection with a fibular free flap in followed by a chin implant and tissue rearrangement at [facility] in June 2021. She underwent chemotherapy with cisplatin, completed in September 2021. She received radiation therapy of 66 Gray in 33 fractions which was completed in September 2021. She also previously had resection of gingival and tongue cancer with a marginal mandibulectomy. This was back in 2012. More recently, an attempt at maxillary dental implants was made. During the course of this, it was  hypothesized that she had some transient bacteremia which seeded her titanium implant. This subsequently eroded through her skin and she has a draining sinus tract. She is followed by otolaryngology at [facility]. They felt that her best chance of healing the wound and preserving her implant, specifically in the setting of radiation therapy would be a prolonged course of IV antibiotics as well as hyperbaric oxygen therapy. She was initially seen by the [facility] wound care clinic but expressed a preference to come here. Fortunately, the majority of the work-up was done by Dr. Con Memos. This included an EKG and chest x-ray. She does have a Port-A-Cath and will be initiating IV antibiotics today for 6-week course of ertapenem. She has been Referred to undergo hyperbaric oxygen therapy at our center. 12/05/2021: 30-day interval follow-up. She says that she thinks she has oral thrush from her IV antibiotics and is also complaining of a vaginal yeast infection. She has been tolerating hyperbaric oxygen therapy without difficulty. She was supposed to have posts placed for dental implants earlier this week, but the oral surgeon felt like the bone was not in good enough condition. The draining sinus beneath her mandible does not seem to be putting out much. 01/02/2022: 30-day interval follow-up. She has recovered from oral and vaginal yeast infections. She continues to tolerate hyperbaric oxygen therapy. She is feeling a little bit frustrated that her other providers are telling her that the exposed bone will just eventually fall off and the wound should close over the gap. She is not noticing any substantial drainage from the site. 01-30-2022 upon evaluation today patient appears to be doing well currently in regard to her hyperbaric treatments. She is currently on a 30-day interval will be due have to do a follow-up in just checking to see where things stand. She actually just had a repeat CT scan which is still  demonstrating osteoradionecrosis at this point. With  that being said I do believe that the patient is benefiting from what we are seeing with regards of hyperbaric oxygen therapy but she has been referred to plastic surgery which I think is absolutely a good idea as well as I believe they may be able to benefit her and help with getting this to heal much more effectively and quickly. Again I think that the hyperbarics would even benefit her following such surgery. 03/06/2022: 30-day update. She is scheduled to undergo significant reconstructive surgery with otolaryngology at [facility] later this month. Per her surgeon, he would like her to continue her hyperbaric oxygen treatments up until the date of surgery and then resume them 14 days postop. She has been tolerating her hyperbaric oxygen therapy without difficulty. I think continuation of her treatments will provide the best optimization possible for her reconstruction and postop recovery. She has no significant complaints. Electronic Signature(s) Signed: 03/06/2022 1:12:30 PM By: [name] MD FACS Entered By: [name] on 03/06/2022 13:12:30 -------------------------------------------------------------------------------- Physical Exam Details Patient Name: Date of Service: [name] 03/06/2022 12:30 PM Medical Record Number: [id number] Patient Account Number: [id number] Date of Birth/Sex: Treating RN: 07/15/1960 (61 y.o. F) Primary Care Provider: [name] Other Clinician: Referring Provider: Treating Provider/Extender: [name] Weeks in Treatment: 17 Constitutional . . . . No acute distress.. Ears, Nose, Mouth, and Throat T grade 1 barotrauma to TM on the left. eed Respiratory .[facility] . Electronic Signature(s) Signed: 03/06/2022 1:15:05 PM By: [name] MD FACS Entered By: [name] on 03/06/2022  13:15:05 -------------------------------------------------------------------------------- Physician Orders Details Patient Name: Date of Service: [name] 03/06/2022 12:30 PM Medical Record Number: [id number] Patient Account Number: [id number] Date of Birth/Sex: Treating RN: 08-31-1960 (61 y.o. 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 Celine Ahr - room 2 - 10/4 at 12:45 PM Return A HBO eval Hyperbaric Oxygen Therapy Evaluate for HBO Therapy - recertify for 40 more treatments Indication: - Osteoradionecrosis of jaw If appropriate for treatment, begin HBOT per protocol: 2.5 ATA for 90 Minutes with 2 Five (5) Minute A Breaks ir Total Number of Treatments: - 40 more treatments 03/06/2022 One treatments per day (delivered Monday through Friday unless otherwise specified in Special Instructions below): A frin (Oxymetazoline HCL) 0.05% nasal spray - 1 spray in both nostrils daily as needed prior to HBO treatment for difficulty clearing ears Electronic Signature(s) Signed: 03/06/2022 1:15:46 PM By: [name] MD FACS Entered By: [name] on 03/06/2022 13:15:46 -------------------------------------------------------------------------------- Problem List Details Patient Name: Date of Service: [name] 03/06/2022 12:30 PM Medical Record Number: [id number] Patient Account Number: [id number] Date of Birth/Sex: Treating RN: 18-Dec-1960 (61 y.o. 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 03/06/2022 12:30 PM Medical Record Number: [id number] Patient Account Number: [id number] Date of Birth/Sex: Treating RN: 07-16-1960 (61 y.o. 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 She underwent resection with a fibular free flap in followed by a chin implant and tissue rearrangement at [facility] in June 2021. She underwent chemotherapy with cisplatin, completed in September 2021. She received radiation therapy of 66 Gray in 33 fractions which was completed in September 2021. She also previously had resection of gingival and tongue cancer with a marginal mandibulectomy. This was back in  2012. More recently, an attempt at maxillary dental implants was made. During the course of this, it was hypothesized that she had some transient bacteremia which seeded her titanium implant. This subsequently eroded through her skin and she has a draining sinus tract. She is followed by otolaryngology at [facility]. They felt that her best chance of healing the wound and preserving her implant, specifically in the setting of radiation therapy would be a prolonged course of IV antibiotics as well as hyperbaric oxygen therapy. She was initially seen by the [facility] wound care clinic but expressed a preference to come here. Fortunately, the majority of the work-up was done by Dr. Con Memos. This included an EKG and chest x-ray. She does have a Port-A-Cath and will be initiating IV antibiotics today for 6-week course of ertapenem. She has been Referred to undergo hyperbaric oxygen therapy at our center. 12/05/2021: 30-day interval follow-up. She says that she thinks she has oral thrush from her IV antibiotics and is also complaining of a vaginal yeast infection. She has been tolerating hyperbaric oxygen therapy without difficulty. She was supposed to have posts placed for dental implants earlier this week, but the oral surgeon felt like the bone was not in good enough condition. The draining sinus beneath her mandible does not seem to be putting out much. 01/02/2022: 30-day interval follow-up. She has recovered from oral and vaginal yeast infections. She continues to tolerate hyperbaric oxygen therapy. She is feeling a little bit frustrated that her other providers are telling her that the exposed bone will just eventually fall off and the wound should close over the gap. She is not noticing any substantial drainage from the site. 01-30-2022 upon evaluation today patient appears to be doing well currently in regard to her hyperbaric treatments. She is currently on a 30-day interval will be due have to do a follow-up  in just checking to see where things stand. She actually just had a repeat CT scan which is still demonstrating osteoradionecrosis at this point. With that being said I do believe that the patient is benefiting from what we are seeing with regards of hyperbaric oxygen therapy but she has been referred to plastic surgery which I think is absolutely a good idea as well as I believe they may be able to benefit her and help with getting this to  heal much more effectively and quickly. Again I think that the hyperbarics would even benefit her following such surgery. 03/06/2022: 30-day update. She is scheduled to undergo significant reconstructive surgery with otolaryngology at [facility] later this month. Per her surgeon, he would like her to continue her hyperbaric oxygen treatments up until the date of surgery and then resume them 14 days postop. She has been tolerating her hyperbaric oxygen therapy without difficulty. I think continuation of her treatments will provide the best optimization possible for her reconstruction and postop recovery. She has no significant complaints. Patient History Family History Unknown History. Social History Former smoker - quit 2014, Marital Status - Married, Alcohol Use - Never, Drug Use - No History, Caffeine Use - Never. Medical History Oncologic Patient has history of Received Chemotherapy - 2021, Received Radiation - 33 tx 2021 Hospitalization/Surgery History - dental implants 12/11/2021. Medical A Surgical History Notes nd Oncologic [facility] Psychiatric Anxiety, Major Depression Objective Constitutional No acute distress.. Vitals Time Taken: 12:53 PM, Height: 60 in, Weight: 88 lbs, BMI: 17.2, Temperature: 97.8 F, Pulse: 75 bpm, Respiratory Rate: 18 breaths/min, Blood Pressure: 127/81 mmHg. Ears, Nose, Mouth, and Throat T grade 1 barotrauma to TM on the left. eed Assessment Active Problems ICD-10 Other osteonecrosis, other site Inflammatory conditions of  jaws Personal history of irradiation Malignant neoplasm of mandible Malignant neoplasm of floor of mouth, unspecified Plan Follow-up Appointments: Return Appointment in: - Dr. Celine Ahr - room 2 - 10/4 at 12:45 PM HBO eval Hyperbaric Oxygen Therapy: Evaluate for HBO Therapy - recertify for 40 more treatments Indication: - Osteoradionecrosis of jaw If appropriate for treatment, begin HBOT per protocol: 2.5 ATA for 90 Minutes with 2 Five (5) Minute Air Breaks T Number of Treatments: - 40 more treatments 03/06/2022 otal One treatments per day (delivered Monday through Friday unless otherwise specified in Special Instructions below): Afrin (Oxymetazoline HCL) 0.05% nasal spray - 1 spray in both nostrils daily as needed prior to HBO treatment for difficulty clearing ears 03/06/2022: The patient continues to do well with her hyperbaric oxygen therapy. She is scheduled to undergo extensive reconstruction later in the month. I think she will benefit from continued hyperbaric treatment to optimize the surgical field for her operation and then to assist with postsurgical healing. We will recertify her for an additional 40 treatments. Electronic Signature(s) Signed: 03/06/2022 1:17:02 PM By: [name] MD FACS Entered By: [name] on 03/06/2022 13:17:02 -------------------------------------------------------------------------------- HxROS Details Patient Name: Date of Service: [name] 03/06/2022 12:30 PM Medical Record Number: [id number] Patient Account Number: [id number] Date of Birth/Sex: Treating RN: [birth year] (61 y.o. F) Primary Care Provider: [name] Other Clinician: Referring Provider: Treating Provider/Extender: Mathews Argyle in Treatment: 17 Oncologic Medical History: Positive for: Received Chemotherapy - 2021; Received Radiation - 33 tx 2021 Past Medical History Notes: [facility] Medical History: Past Medical History  Notes: Anxiety, Major Depression Immunizations Pneumococcal Vaccine: Received Pneumococcal Vaccination: No Implantable Devices None Hospitalization / Surgery History Type of Hospitalization/Surgery dental implants 12/11/2021 Family and Social History Unknown History: Yes; Former smoker - quit 2014; Marital Status - Married; Alcohol Use: Never; Drug Use: No History; Caffeine Use: Never; Financial Concerns: No; Food, Clothing or Shelter Needs: No; Support System Lacking: No; Transportation Concerns: No Electronic Signature(s) Signed: 03/06/2022 1:22:10 PM By: [name] MD FACS Entered By: [name] on 03/06/2022 13:12:48 -------------------------------------------------------------------------------- SuperBill Details Patient Name: Date of Service: [name] 03/06/2022 Medical Record Number: [id number]  Patient Account Number: [id number] Date of Birth/Sex: Treating RN: 1961-03-13 (61 y.o. 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

## 2022-03-07 ENCOUNTER — Encounter (HOSPITAL_BASED_OUTPATIENT_CLINIC_OR_DEPARTMENT_OTHER): Payer: Medicare Other | Admitting: General Surgery

## 2022-03-07 DIAGNOSIS — M879 Osteonecrosis, unspecified: Secondary | ICD-10-CM | POA: Diagnosis not present

## 2022-03-07 NOTE — Progress Notes (Addendum)
Theresa, Dillon ([id number]) Visit Report for 03/07/2022 Arrival Information Details Patient Name: Date of Service: [name] 03/07/2022 1:00 PM Medical Record Number: [id number] Patient Account Number: [id number] Date of Birth/Sex: Treating RN: 30-Dec-1960 (61 y.o. [name] Primary Care Teyton Pattillo: [name] Other Clinician: [name] Referring Kutler Vanvranken: Treating Ralene Gasparyan/Extender: Mathews Argyle in Treatment: 73 Visit Information History Since Last Visit All ordered tests and consults were completed: Yes Patient Arrived: Ambulatory Added or deleted any medications: No Arrival Time: 12:11 Any new allergies or adverse reactions: No Accompanied By: None Had a fall or experienced change in No Transfer Assistance: None activities of daily living that may affect Patient Identification Verified: Yes risk of falls: Secondary Verification Process Completed: Yes Signs or symptoms of abuse/neglect since last visito No Patient Requires Transmission-Based Precautions: No Hospitalized since last visit: No Patient Has Alerts: No Implantable device outside of the clinic excluding No cellular tissue based products placed in the center since last visit: Pain Present Now: No Electronic Signature(s) Signed: 03/07/2022 2:49:43 PM By: [name] EMT Entered By: [name] on 03/07/2022 14:49:43 -------------------------------------------------------------------------------- Encounter Discharge Information Details Patient Name: Date of Service: [name] 03/07/2022 1:00 PM Medical Record Number: [id number] Patient Account Number: [id number] Date of Birth/Sex: Treating RN: 1960/08/03 (61 y.o. [name] Primary Care Ercell Perlman: [name] Other Clinician: [name] Referring Memory Heinrichs: Treating Esmirna Ravan/Extender: Mathews Argyle in Treatment: 17 Encounter Discharge Information  Items Discharge Condition: Stable Ambulatory Status: Ambulatory Discharge Destination: Home Transportation: Private Auto Accompanied By: None Schedule Follow-up Appointment: Yes Clinical Summary of Care: Electronic Signature(s) Signed: 03/07/2022 2:57:20 PM By: [name] EMT Entered By: [name] on 03/07/2022 14:57:20 -------------------------------------------------------------------------------- Vitals Details Patient Name: Date of Service: [name] 03/07/2022 1:00 PM Medical Record Number: [id number] Patient Account Number: [id number] Date of Birth/Sex: Treating RN: 11-15-1960 (61 y.o. [name] Primary Care Shronda Boeh: [name] Other Clinician: [name] Referring Smera Guyette: Treating Brave Dack/Extender: [name] Weeks in Treatment: 17 Vital Signs Time Taken: 12:28 Temperature (F): 98.2 Height (in): 60 Pulse (bpm): 79 Weight (lbs): 88 Respiratory Rate (breaths/min): 16 Body Mass Index (BMI): 17.2 Blood Pressure (mmHg): 117/72 Reference Range: 80 - 120 mg / dl Electronic Signature(s) Signed: 03/07/2022 2:50:10 PM By: [name] EMT Entered By: [name] on 03/07/2022 14:50:09

## 2022-03-07 NOTE — Progress Notes (Signed)
DARCEY, CARDY ([id number]) Visit Report for 03/07/2022 Problem List Details Patient Name: Date of Service: [name] 03/07/2022 1:00 PM Medical Record Number: [id number] Patient Account Number: [id number] Date of Birth/Sex: Treating RN: [birth year] (61 y.o. 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 03/07/2022 Medical Record Number: [id number] Patient Account Number: [id number] Date of Birth/Sex: Treating RN: 08/15/1960 (61 y.o. 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, unspecified Facility Procedures CPT4 Code: [id number] Description: G0277-(Facility Use Only) HBOT full body chamber, 28mn , ICD-10  Diagnosis Description M27.2 Inflammatory conditions of jaws M87.88 Other osteonecrosis, other site Z92.3 Personal history of irradiation C41.1 Malignant neoplasm of mandible Modifier: Quantity: 4 Physician Procedures : CPT4 Code Description Modifier [id number]- [id number] PHYS HYPERBARIC OXYGEN THERAPY ICD-10 Diagnosis Description M27.2 Inflammatory conditions of jaws M87.88 Other osteonecrosis, other site Z92.3 Personal history of irradiation C41.1 Malignant neoplasm of  mandible Quantity: 1 Electronic Signature(s) Signed: 03/07/2022 2:56:35 PM By: GValeria BatmanEMT Signed: 03/07/2022 3:12:54 PM By: CFredirick MaudlinMD FACS Entered By: GValeria Batmanon 03/07/2022 14:56:35

## 2022-03-07 NOTE — Progress Notes (Addendum)
[name], [name] ([id number]) Visit Report for 03/07/2022 HBO Details Patient Name: Date of Service: [name] 03/07/2022 1:00 PM Medical Record Number: [id number] Patient Account Number: [id number] Date of Birth/Sex: Treating RN: 1961-04-23 (61 y.o. 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, Jaw HBO Treatment Details Treatment Number: 77 Patient Type: Outpatient Chamber Type: Monoplace Chamber Serial #: [id number] Treatment Protocol: 2.5 ATA with 90 minutes oxygen, with two 5 minute air breaks Treatment Details Compression Rate Down: 2.0 psi / minute De-Compression Rate Up: 2.0 psi / minute A breaks and breathing ir Compress Tx Pressure periods Decompress Decompress Begins Reached (leave unused spaces Begins Ends blank) Chamber Pressure (ATA 1 2.5 2.5 2.5 2.5 2.5 - - 2.5 1 ) Clock Time (24 hr) 12:34 12:48 13:18 13:23 13:53 13:58 - - 14:29 14:41 Treatment Length: 127 (minutes) Treatment Segments: 4 Vital Signs Capillary Blood Glucose Reference Range: 80 - 120 mg / dl HBO Diabetic Blood Glucose Intervention Range: <131 mg/dl or >249 mg/dl Time Vitals Blood Respiratory Capillary Blood Glucose Pulse Action Type: Pulse: Temperature: Taken: Pressure: Rate: Glucose (mg/dl): Meter #: Oximetry (%) Taken: Pre 12:28 117/72 79 16 98.2 Post 14:42 104/72 66 16 98.2 Treatment Response Treatment Toleration: Well Treatment Completion Status: Treatment Completed without Adverse Event Electronic Signature(s) Signed: 03/07/2022 3:13:25 PM By: [name] MD FACS Previous Signature: 03/07/2022 2:55:59 PM Version  By: [name] EMT Entered By: [name] on 03/07/2022 15:13:25 -------------------------------------------------------------------------------- HBO Safety Checklist Details Patient Name: Date of Service: [name] 03/07/2022 1:00 PM Medical Record Number: [id number] Patient Account Number: [id number] Date of Birth/Sex: Treating RN: [birth year] (61 y.o. [name] Primary Care Armandina Iman: [name] Other Clinician: [name] Referring Eldoris Beiser: Treating Zeyna Mkrtchyan/Extender: [name] Weeks in Treatment: 17 HBO Safety Checklist Items Safety Checklist Consent Form Signed Patient voided / foley secured and emptied When did you last eato 1100 Last dose of injectable or oral agent NA Ostomy pouch emptied and vented if applicable NA All implantable devices assessed, documented and approved NA Intravenous access site secured and place NA Valuables secured Linens and cotton and cotton/polyester blend (less than 51% polyester) Personal oil-based products / skin lotions / body lotions removed Wigs or hairpieces removed NA Smoking or tobacco materials removed Books / newspapers / magazines / loose paper removed Cologne, aftershave, perfume and deodorant removed Jewelry removed (may wrap wedding band) NA Make-up removed Hair care products removed Battery operated devices (external) removed Heating patches and chemical warmers removed Titanium eyewear removed NA Nail polish cured greater than 10 hours NA Casting material cured greater than 10 hours NA Hearing aids removed NA Loose dentures or partials removed NA Prosthetics have been removed NA Patient demonstrates correct use of air break device (if applicable) Patient concerns have been addressed Patient grounding bracelet on and cord attached to chamber Specifics for Inpatients (complete in addition to above) Medication sheet sent with patient NA Intravenous  medications needed or due during therapy sent with patient NA Drainage tubes (e.g. nasogastric tube or chest tube secured and vented) NA Endotracheal or Tracheotomy tube secured NA Cuff deflated of air and inflated with saline NA Airway suctioned NA Notes The safety checklist was done  before the treatment was started. Electronic Signature(s) Signed: 03/07/2022 2:51:36 PM By: [name] EMT Entered By: [name] on 03/07/2022 14:51:35

## 2022-03-07 NOTE — Progress Notes (Signed)
CALEEN, TAAFFE ([id number]) Visit Report for 03/06/2022 SuperBill Details Patient Name: Date of Service: [name] 03/06/2022 Medical Record Number: [id number] Patient Account Number: [id number] Date of Birth/Sex: Treating RN: 1961-01-18 (61 y.o. 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

## 2022-03-08 ENCOUNTER — Encounter (HOSPITAL_BASED_OUTPATIENT_CLINIC_OR_DEPARTMENT_OTHER): Payer: Medicare Other | Admitting: General Surgery

## 2022-03-11 ENCOUNTER — Encounter (HOSPITAL_BASED_OUTPATIENT_CLINIC_OR_DEPARTMENT_OTHER): Payer: Medicare Other | Admitting: Internal Medicine

## 2022-03-11 ENCOUNTER — Encounter (HOSPITAL_BASED_OUTPATIENT_CLINIC_OR_DEPARTMENT_OTHER): Payer: Self-pay

## 2022-03-12 ENCOUNTER — Encounter (HOSPITAL_BASED_OUTPATIENT_CLINIC_OR_DEPARTMENT_OTHER): Payer: Medicare Other | Admitting: Internal Medicine

## 2022-03-12 DIAGNOSIS — Z923 Personal history of irradiation: Secondary | ICD-10-CM

## 2022-03-12 DIAGNOSIS — M8788 Other osteonecrosis, other site: Secondary | ICD-10-CM | POA: Diagnosis not present

## 2022-03-12 DIAGNOSIS — M879 Osteonecrosis, unspecified: Secondary | ICD-10-CM | POA: Diagnosis not present

## 2022-03-12 DIAGNOSIS — C411 Malignant neoplasm of mandible: Secondary | ICD-10-CM | POA: Diagnosis not present

## 2022-03-12 DIAGNOSIS — M272 Inflammatory conditions of jaws: Secondary | ICD-10-CM | POA: Diagnosis not present

## 2022-03-12 NOTE — Progress Notes (Addendum)
[name], ISIP ([id number]) Visit Report for 03/12/2022 HBO Details Patient Name: Date of Service: [name] 03/12/2022 1:00 PM Medical Record Number: [id number] Patient Account Number: [id number] Date of Birth/Sex: Treating RN: 1960-10-05 (61 y.o. 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, Jaw HBO Treatment Details Treatment Number: 78 Patient Type: Outpatient Chamber Type: Monoplace Chamber Serial #: [id number] Treatment Protocol: 2.5 ATA with 90 minutes oxygen, with two 5 minute air breaks Treatment Details Compression Rate Down: 1.5 psi / minute De-Compression Rate Up: 2.0 psi / minute A breaks and breathing ir Compress Tx Pressure periods Decompress Decompress Begins Reached (leave unused spaces Begins Ends blank) Chamber Pressure (ATA 1 2.5 2.5 2.5 2.5 2.5 - - 2.5 1 ) Clock Time (24 hr) 12:30 12:46 13:16 13:21 13:51 13:56 - - 14:26 14:37 Treatment Length: 127 (minutes) Treatment Segments: 4 Vital Signs Capillary Blood Glucose Reference Range: 80 - 120 mg / dl HBO Diabetic Blood Glucose Intervention Range: <131 mg/dl or >249 mg/dl Capillary Time Pulse Blood Pulse: Respiratory Temperature: Blood Glucose Oximetry Action Type: Vitals Pressure: Rate: Glucose Meter #: Taken: Taken: (%) (mg/dl): Pre 12:25 104/58 79 16 65.4 diastolic BP below 60 mmHg, asymptomatic Diastolic BP <65KPTW, asymptomatic for Post 14:41 125/58 71 16 98.2 hypotension Treatment Response Treatment Toleration: Well Treatment Completion Status: Treatment Completed without Adverse Event Treatment Notes Patient stated  her left ear was hurting and requested physician to examine it. Dr. Enrigue Catena complained of mild ear pain to the left ear. She states its been ongoing for the past week. Upon evaluation the tympanic membrane was translucent, and no irritation noted. No drainage noted or pain to the auricle. Physician HBO Attestation: I certify that I supervised this HBO treatment in accordance with Medicare guidelines. A trained emergency response team is readily available per Yes hospital policies and procedures. Continue HBOT as ordered. Yes Electronic Signature(s) Signed: 03/12/2022 3:59:59 PM By: [name] DO Previous Signature: 03/12/2022 3:08:58 PM Version By: Donavan Burnet CHT EMT BS , , Previous Signature: 03/12/2022 3:08:00 PM Version By: Donavan Burnet CHT EMT BS , , Entered By: [name] on 03/12/2022 15:43:32 -------------------------------------------------------------------------------- HBO Safety Checklist Details Patient Name: Date of Service: [name] 03/12/2022 1:00 PM Medical Record Number: [id number] Patient Account Number: [id number] Date of Birth/Sex: Treating RN: 02-27-1961 (61 y.o. [name] Primary Care Anaiyah Anglemyer: [name] Other Clinician: Donavan Burnet Referring Shatiqua Heroux: Treating Jillyan Plitt/Extender: [name] Weeks in Treatment: 18 HBO Safety Checklist Items Safety Checklist Consent Form Signed Patient voided / foley secured and emptied When did you last eato 1200 Last dose of injectable or oral agent n/a Ostomy pouch emptied and vented if applicable NA All implantable devices assessed, documented and approved port Intravenous access site secured and place NA Valuables secured Linens and cotton and cotton/polyester blend (less than 51% polyester) Personal oil-based products / skin lotions / body lotions removed Wigs or hairpieces removed NA Smoking or tobacco materials  removed NA Books / newspapers / magazines / loose paper removed Cologne, aftershave, perfume and deodorant removed Jewelry removed (may wrap wedding band) Make-up removed Hair care products removed Battery operated devices (external) removed Heating patches and chemical warmers removed Titanium eyewear  removed NA Nail polish cured greater than 10 hours Casting material cured greater than 10 hours NA Hearing aids removed NA Loose dentures or partials removed NA Prosthetics have been removed NA Patient demonstrates correct use of air break device (if applicable) Patient concerns have been addressed Patient grounding bracelet on and cord attached to chamber Specifics for Inpatients (complete in addition to above) Medication sheet sent with patient NA Intravenous medications needed or due during therapy sent with patient NA Drainage tubes (e.g. nasogastric tube or chest tube secured and vented) NA Endotracheal or Tracheotomy tube secured NA Cuff deflated of air and inflated with saline NA Airway suctioned NA Notes Paper version used prior to treatment start. Electronic Signature(s) Signed: 03/12/2022 2:11:13 PM By: Donavan Burnet CHT EMT BS , , Previous Signature: 03/12/2022 2:08:19 PM Version By: Donavan Burnet CHT EMT BS , , Entered By: Donavan Burnet on 03/12/2022 14:11:12

## 2022-03-12 NOTE — Progress Notes (Signed)
[name], [name] ([id number]) Visit Report for 03/12/2022 SuperBill Details Patient Name: Date of Service: [name] 03/12/2022 Medical Record Number: [id number] Patient Account Number: [id number] Date of Birth/Sex: Treating RN: [birth year] (61 y.o. 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

## 2022-03-12 NOTE — Progress Notes (Addendum)
DARRIEL, SINQUEFIELD ([id number]) Visit Report for 03/12/2022 Arrival Information Details Patient Name: Date of Service: [name] 03/12/2022 1:00 PM Medical Record Number: [id number] Patient Account Number: [id number] Date of Birth/Sex: Treating RN: 05-28-1961 (61 y.o. Donalda Ewings Primary Care Esra Frankowski: [name] Other Clinician: Donavan Burnet Referring Justa Hatchell: Treating Janene Yousuf/Extender: [name] in Treatment: 18 Visit Information History Since Last Visit All ordered tests and consults were completed: Yes Patient Arrived: Ambulatory Added or deleted any medications: No Arrival Time: 12:10 Any new allergies or adverse reactions: No Accompanied By: self Had a fall or experienced change in No Transfer Assistance: None activities of daily living that may affect Patient Identification Verified: Yes risk of falls: Secondary Verification Process Completed: Yes Signs or symptoms of abuse/neglect since last visito No Patient Requires Transmission-Based Precautions: No Hospitalized since last visit: No Patient Has Alerts: No Implantable device outside of the clinic excluding No cellular tissue based products placed in the center since last visit: Pain Present Now: No Electronic Signature(s) Signed: 03/12/2022 2:10:48 PM By: Donavan Burnet CHT EMT BS , , Previous Signature: 03/12/2022 2:06:18 PM Version By: Donavan Burnet CHT EMT BS , , Entered By: Donavan Burnet on 03/12/2022 14:10:48 -------------------------------------------------------------------------------- Encounter Discharge Information Details Patient Name: Date of Service: [name] 03/12/2022 1:00 PM Medical Record Number: [id number] Patient Account Number: [id number] Date of Birth/Sex: Treating RN: August 16, 1960 (61 y.o. Donalda Ewings Primary Care Channel Papandrea: [name] Other Clinician: Donavan Burnet Referring  Jahsiah Carpenter: Treating Keala Drum/Extender: [name] in Treatment: 18 Encounter Discharge Information Items Discharge Condition: Stable Ambulatory Status: Ambulatory Discharge Destination: Home Transportation: Private Auto Accompanied By: self Schedule Follow-up Appointment: No Clinical Summary of Care: Electronic Signature(s) Signed: 03/12/2022 3:10:04 PM By: Donavan Burnet CHT EMT BS , , Entered By: Donavan Burnet on 03/12/2022 15:10:04 -------------------------------------------------------------------------------- Vitals Details Patient Name: Date of Service: [name] 03/12/2022 1:00 PM Medical Record Number: [id number] Patient Account Number: [id number] Date of Birth/Sex: Treating RN: March 13, 1961 (61 y.o. Donalda Ewings Primary Care Sherill Wegener: [name] Other Clinician: Donavan Burnet Referring Aryella Besecker: Treating Kiely Cousar/Extender: [name] Weeks in Treatment: 18 Vital Signs Time Taken: 12:25 Temperature (F): 98.2 Height (in): 60 Pulse (bpm): 79 Weight (lbs): 88 Respiratory Rate (breaths/min): 16 Body Mass Index (BMI): 17.2 Blood Pressure (mmHg): 104/58 Reference Range: 80 - 120 mg / dl Electronic Signature(s) Signed: 03/12/2022 2:10:58 PM By: Donavan Burnet CHT EMT BS , , Previous Signature: 03/12/2022 2:06:47 PM Version By: Donavan Burnet CHT EMT BS , , Entered By: Donavan Burnet on 03/12/2022 14:10:57

## 2022-03-13 ENCOUNTER — Encounter (HOSPITAL_BASED_OUTPATIENT_CLINIC_OR_DEPARTMENT_OTHER): Payer: Medicare Other | Admitting: General Surgery

## 2022-03-13 DIAGNOSIS — M879 Osteonecrosis, unspecified: Secondary | ICD-10-CM | POA: Diagnosis not present

## 2022-03-13 NOTE — Progress Notes (Signed)
Theresa, Dillon ([id number]) Visit Report for 03/13/2022 Arrival Information Details Patient Name: Date of Service: [name] 03/13/2022 1:00 PM Medical Record Number: [id number] Patient Account Number: [id number] Date of Birth/Sex: Treating RN: Nov 28, 1960 (61 y.o. [name] Primary Care Hollin Crewe: [name] Other Clinician: [name] Referring [name]: Treating Maninder Deboer/Extender: Mathews Argyle in Treatment: 18 Visit Information History Since Last Visit All ordered tests and consults were completed: Yes Patient Arrived: Ambulatory Added or deleted any medications: No Arrival Time: 12:08 Any new allergies or adverse reactions: No Accompanied By: None Had a fall or experienced change in No Transfer Assistance: None activities of daily living that may affect Patient Identification Verified: Yes risk of falls: Secondary Verification Process Completed: Yes Signs or symptoms of abuse/neglect since last visito No Patient Requires Transmission-Based Precautions: No Hospitalized since last visit: No Patient Has Alerts: No Implantable device outside of the clinic excluding No cellular tissue based products placed in the center since last visit: Pain Present Now: No Electronic Signature(s) Signed: 03/13/2022 3:53:40 PM By: [name] EMT Entered By: [name] on 03/13/2022 15:53:40 -------------------------------------------------------------------------------- Encounter Discharge Information Details Patient Name: Date of Service: [name] 03/13/2022 1:00 PM Medical Record Number: [id number] Patient Account Number: [id number] Date of Birth/Sex: Treating RN: [birth year] (61 y.o. [name] Primary Care Dewey Neukam: [name] Other Clinician: [name] Referring Franchelle Foskett: Treating Kennieth Plotts/Extender: Mathews Argyle in Treatment: 18 Encounter Discharge  Information Items Discharge Condition: Stable Ambulatory Status: Ambulatory Discharge Destination: Home Transportation: Private Auto Accompanied By: None Schedule Follow-up Appointment: Yes Clinical Summary of Care: Electronic Signature(s) Signed: 03/13/2022 3:58:48 PM By: [name] EMT Entered By: [name] on 03/13/2022 15:58:48 -------------------------------------------------------------------------------- Vitals Details Patient Name: Date of Service: [name] 03/13/2022 1:00 PM Medical Record Number: [id number] Patient Account Number: [id number] Date of Birth/Sex: Treating RN: 06/16/1961 (61 y.o. [name] Primary Care Ebunoluwa Gernert: [name] Other Clinician: [name] Referring Keyonni Percival: Treating Brilyn Tuller/Extender: [name] Weeks in Treatment: 18 Vital Signs Time Taken: 12:34 Temperature (F): 98.8 Height (in): 60 Pulse (bpm): 72 Weight (lbs): 88 Respiratory Rate (breaths/min): 16 Body Mass Index (BMI): 17.2 Blood Pressure (mmHg): 120/73 Reference Range: 80 - 120 mg / dl Electronic Signature(s) Signed: 03/13/2022 3:54:04 PM By: [name] EMT Entered By: [name] on 03/13/2022 15:54:04

## 2022-03-13 NOTE — Progress Notes (Addendum)
[name], HUNT ([id number]) Visit Report for 03/13/2022 HBO Details Patient Name: Date of Service: [name] 03/13/2022 1:00 PM Medical Record Number: [id number] Patient Account Number: [id number] Date of Birth/Sex: Treating RN: 27-Sep-1960 (61 y.o. 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, Jaw HBO Treatment Details Treatment Number: 79 Patient Type: Outpatient Chamber Type: Monoplace Chamber Serial #: [id number] Treatment Protocol: 2.5 ATA with 90 minutes oxygen, with two 5 minute air breaks Treatment Details Compression Rate Down: 2.0 psi / minute De-Compression Rate Up: 2.0 psi / minute A breaks and breathing ir Compress Tx Pressure periods Decompress Decompress Begins Reached (leave unused spaces Begins Ends blank) Chamber Pressure (ATA 1 2.5 2.5 2.5 2.5 2.5 - - 2.5 1 ) Clock Time (24 hr) 12:42 12:57 13:27 13:32 14:02 14:07 - - 14:38 14:49 Treatment Length: 127 (minutes) Treatment Segments: 4 Vital Signs Capillary Blood Glucose Reference Range: 80 - 120 mg / dl HBO Diabetic Blood Glucose Intervention Range: <131 mg/dl or >249 mg/dl Time Vitals Blood Respiratory Capillary Blood Glucose Pulse Action Type: Pulse: Temperature: Taken: Pressure: Rate: Glucose (mg/dl): Meter #: Oximetry (%) Taken: Pre 12:34 120/73 72 16 98.8 Post 14:52 100/79 69 18 97.8 Treatment Response Treatment Toleration: Well Treatment Completion Status: Treatment Completed without Adverse Event Physician HBO Attestation: I certify that I supervised this HBO treatment in accordance with Medicare guidelines. A trained  emergency response team is readily available per Yes hospital policies and procedures. Continue HBOT as ordered. Yes Electronic Signature(s) Signed: 03/13/2022 4:16:47 PM By: [name] MD FACS Previous Signature: 03/13/2022 3:57:56 PM Version By: [name] EMT Entered By: [name] on 03/13/2022 16:16:47 -------------------------------------------------------------------------------- HBO Safety Checklist Details Patient Name: Date of Service: [name] 03/13/2022 1:00 PM Medical Record Number: [id number] Patient Account Number: [id number] Date of Birth/Sex: Treating RN: 1961-03-04 (61 y.o. [name] Primary Care [name]: [name] Other Clinician: [name] Referring Denesha Brouse: Treating Arisbel Maione/Extender: [name] Weeks in Treatment: 18 HBO Safety Checklist Items Safety Checklist Consent Form Signed Patient voided / foley secured and emptied When did you last eato 1000 Last dose of injectable or oral agent NA Ostomy pouch emptied and vented if applicable NA All implantable devices assessed, documented and approved NA Intravenous access site secured and place NA Valuables secured Linens and cotton and cotton/polyester blend (less than 51% polyester) Personal oil-based products / skin lotions / body lotions removed Wigs or hairpieces removed NA Smoking or tobacco materials removed Books / newspapers / magazines / loose paper removed Cologne, aftershave, perfume and deodorant removed Jewelry removed (may wrap wedding band) NA Make-up removed Hair care products removed Battery operated devices (external) removed Heating patches and chemical warmers removed Titanium eyewear removed NA Nail polish cured greater than 10 hours NA Casting material cured greater than 10 hours NA Hearing aids removed NA Loose dentures or partials removed NA Prosthetics have been removed NA Patient demonstrates  correct use of air break device (if applicable) Patient concerns have been addressed Patient grounding bracelet on and cord attached to chamber Specifics for Inpatients (complete in addition to above) Medication sheet sent with patient NA Intravenous medications needed or due during therapy sent with patient NA Drainage  tubes (e.g. nasogastric tube or chest tube secured and vented) NA Endotracheal or Tracheotomy tube secured NA Cuff deflated of air and inflated with saline NA Airway suctioned NA Notes The safety checklist was done before the treatment was started. Electronic Signature(s) Signed: 03/13/2022 3:55:32 PM By: [name] EMT Entered By: [name] on 03/13/2022 15:55:32

## 2022-03-13 NOTE — Progress Notes (Signed)
ANASTON, KOEHN ([id number]) Visit Report for 03/13/2022 Problem List Details Patient Name: Date of Service: [name] 03/13/2022 1:00 PM Medical Record Number: [id number] Patient Account Number: [id number] Date of Birth/Sex: Treating RN: 05-01-1961 (61 y.o. 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 03/13/2022 Medical Record Number: [id number] Patient Account Number: [id number] Date of Birth/Sex: Treating RN: 1960-08-31 (61 y.o. 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

## 2022-03-14 ENCOUNTER — Encounter (HOSPITAL_BASED_OUTPATIENT_CLINIC_OR_DEPARTMENT_OTHER): Payer: Medicare Other | Admitting: General Surgery

## 2022-03-15 ENCOUNTER — Encounter (HOSPITAL_BASED_OUTPATIENT_CLINIC_OR_DEPARTMENT_OTHER): Payer: Medicare Other | Admitting: General Surgery

## 2022-03-18 ENCOUNTER — Encounter (HOSPITAL_BASED_OUTPATIENT_CLINIC_OR_DEPARTMENT_OTHER): Payer: Medicare Other | Admitting: General Surgery

## 2022-03-18 DIAGNOSIS — M879 Osteonecrosis, unspecified: Secondary | ICD-10-CM | POA: Diagnosis not present

## 2022-03-18 NOTE — Progress Notes (Addendum)
AYSE, MCCARTIN ([id number]) Visit Report for 03/18/2022 Arrival Information Details Patient Name: Date of Service: [name] 03/18/2022 1:00 PM Medical Record Number: [id number] Patient Account Number: [id number] Date of Birth/Sex: Treating RN: 1960/10/06 (61 y.o. [name], [name] Primary Care Carlas Vandyne: [name] Other Clinician: [name] Referring Akiah Bauch: Treating Niki Payment/Extender: Mathews Argyle in Treatment: 72 Visit Information History Since Last Visit All ordered tests and consults were completed: Yes Patient Arrived: Ambulatory Added or deleted any medications: No Arrival Time: 12:28 Any new allergies or adverse reactions: No Accompanied By: None Had a fall or experienced change in No Transfer Assistance: None activities of daily living that may affect Patient Identification Verified: Yes risk of falls: Secondary Verification Process Completed: Yes Signs or symptoms of abuse/neglect since last visito No Patient Requires Transmission-Based Precautions: No Hospitalized since last visit: No Patient Has Alerts: No Implantable device outside of the clinic excluding No cellular tissue based products placed in the center since last visit: Pain Present Now: No Electronic Signature(s) Signed: 03/18/2022 3:22:37 PM By: [name] EMT Entered By: [name] on 03/18/2022 15:22:36 -------------------------------------------------------------------------------- Encounter Discharge Information Details Patient Name: Date of Service: [name] 03/18/2022 1:00 PM Medical Record Number: [id number] Patient Account Number: [id number] Date of Birth/Sex: Treating RN: 13-Dec-1960 (61 y.o. [name], [name] Primary Care Naoma Boxell: [name] Other Clinician: [name] Referring Bane Hagy: Treating Angle Karel/Extender: Mathews Argyle in Treatment: 19 Encounter Discharge  Information Items Discharge Condition: Stable Ambulatory Status: Ambulatory Discharge Destination: Home Transportation: Private Auto Accompanied By: None Schedule Follow-up Appointment: Yes Clinical Summary of Care: Electronic Signature(s) Signed: 03/18/2022 3:27:33 PM By: [name] EMT Entered By: [name] on 03/18/2022 15:27:33 -------------------------------------------------------------------------------- Vitals Details Patient Name: Date of Service: [name] 03/18/2022 1:00 PM Medical Record Number: [id number] Patient Account Number: [id number] Date of Birth/Sex: Treating RN: 1960/12/18 (61 y.o. [name], [name] Primary Care Gladys Gutman: [name] Other Clinician: [name] Referring Lynnae Ludemann: Treating [name]/Extender: [name] Weeks in Treatment: 19 Vital Signs Time Taken: 12:43 Temperature (F): 98.7 Height (in): 60 Pulse (bpm): 88 Weight (lbs): 88 Respiratory Rate (breaths/min): 14 Body Mass Index (BMI): 17.2 Blood Pressure (mmHg): 108/81 Reference Range: 80 - 120 mg / dl Electronic Signature(s) Signed: 03/18/2022 3:23:01 PM By: [name] EMT Entered By: [name] on 03/18/2022 15:23:01

## 2022-03-18 NOTE — Progress Notes (Signed)
[name], [name] ([id number]) Visit Report for 03/18/2022 Problem List Details Patient Name: Date of Service: [name] 03/18/2022 1:00 PM Medical Record Number: [id number] Patient Account Number: [id number] Date of Birth/Sex: Treating RN: 1960/08/03 (61 y.o. 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 03/18/2022 Medical Record Number: [id number] Patient Account Number: [id number] Date of Birth/Sex: Treating RN: 1960/07/02 (61 y.o. 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

## 2022-03-18 NOTE — Progress Notes (Addendum)
[name], [name] ([id number]) Visit Report for 03/18/2022 HBO Details Patient Name: Date of Service: [name] 03/18/2022 1:00 PM Medical Record Number: [id number] Patient Account Number: [id number] Date of Birth/Sex: Treating RN: Jul 02, 1960 (61 y.o. 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, Jaw HBO Treatment Details Treatment Number: 80 Patient Type: Outpatient Chamber Type: Monoplace Chamber Serial #: [id number] Treatment Protocol: 2.5 ATA with 90 minutes oxygen, with two 5 minute air breaks Treatment Details Compression Rate Down: 2.0 psi / minute De-Compression Rate Up: 2.0 psi / minute A breaks and breathing ir Compress Tx Pressure periods Decompress Decompress Begins Reached (leave unused spaces Begins Ends blank) Chamber Pressure (ATA 1 2.5 2.5 2.5 2.5 2.5 - - 2.5 1 ) Clock Time (24 hr) 12:53 13:04 13:34 13:39 14:09 14:15 - - 14:45 14:56 Treatment Length: 123 (minutes) Treatment Segments: 4 Vital Signs Capillary Blood Glucose Reference Range: 80 - 120 mg / dl HBO Diabetic Blood Glucose Intervention Range: <131 mg/dl or >249 mg/dl Time Vitals Blood Respiratory Capillary Blood Glucose Pulse Action Type: Pulse: Temperature: Taken: Pressure: Rate: Glucose (mg/dl): Meter #: Oximetry (%) Taken: Pre 12:43 108/81 88 14 98.7 Post 14:59 124/83 67 16 98.7 Treatment Response Treatment Toleration: Well Treatment Completion Status: Treatment Completed without Adverse Event Physician HBO Attestation: I certify that I supervised this HBO treatment in accordance with Medicare guidelines. A trained  emergency response team is readily available per Yes hospital policies and procedures. Continue HBOT as ordered. Yes Electronic Signature(s) Signed: 03/18/2022 3:32:18 PM By: Theresa Maudlin MD FACS Previous Signature: 03/18/2022 3:26:36 PM Version By: [name] EMT Entered By: [name] on 03/18/2022 15:32:17 -------------------------------------------------------------------------------- HBO Safety Checklist Details Patient Name: Date of Service: [name] 03/18/2022 1:00 PM Medical Record Number: [id number] Patient Account Number: [id number] Date of Birth/Sex: Treating RN: 27-Oct-1960 (61 y.o. [name], [name] Primary Care [name]: [name] Other Clinician: Valeria Dillon Referring [name]: Treating [name]/Extender: [name] Weeks in Treatment: 19 HBO Safety Checklist Items Safety Checklist Consent Form Signed Patient voided / foley secured and emptied When did you last eato 0900 Last dose of injectable or oral agent NA Ostomy pouch emptied and vented if applicable NA All implantable devices assessed, documented and approved NA Intravenous access site secured and place NA Valuables secured Linens and cotton and cotton/polyester blend (less than 51% polyester) Personal oil-based products / skin lotions / body lotions removed Wigs or hairpieces removed NA Smoking or tobacco materials removed Books / newspapers / magazines / loose paper removed Cologne, aftershave, perfume and deodorant removed Jewelry removed (may wrap wedding band) NA Make-up removed Hair care products removed Battery operated devices (external) removed Heating patches and chemical warmers removed Titanium eyewear removed NA Nail polish cured greater than 10 hours NA Casting material cured greater than 10 hours NA Hearing aids removed NA Loose dentures or partials removed NA Prosthetics have been removed NA Patient demonstrates  correct use of air break device (if applicable) Patient concerns have been addressed Patient grounding bracelet on and cord attached to chamber Specifics for Inpatients (complete in addition to above) Medication sheet sent with patient NA Intravenous medications needed or due during therapy sent with patient NA Drainage  tubes (e.g. nasogastric tube or chest tube secured and vented) NA Endotracheal or Tracheotomy tube secured NA Cuff deflated of air and inflated with saline NA Airway suctioned NA Notes The safety checklist was done before the treatment was started. Electronic Signature(s) Signed: 03/18/2022 3:23:49 PM By: [name] EMT Entered By: [name] on 03/18/2022 15:23:48

## 2022-03-19 ENCOUNTER — Encounter (HOSPITAL_BASED_OUTPATIENT_CLINIC_OR_DEPARTMENT_OTHER): Payer: Medicare Other | Admitting: Internal Medicine

## 2022-03-19 DIAGNOSIS — C411 Malignant neoplasm of mandible: Secondary | ICD-10-CM

## 2022-03-19 DIAGNOSIS — M8788 Other osteonecrosis, other site: Secondary | ICD-10-CM

## 2022-03-19 DIAGNOSIS — M272 Inflammatory conditions of jaws: Secondary | ICD-10-CM | POA: Diagnosis not present

## 2022-03-19 DIAGNOSIS — M879 Osteonecrosis, unspecified: Secondary | ICD-10-CM | POA: Diagnosis not present

## 2022-03-19 DIAGNOSIS — Z923 Personal history of irradiation: Secondary | ICD-10-CM

## 2022-03-19 NOTE — Progress Notes (Addendum)
CAMYLA, CAMPOSANO ([id number]) Visit Report for 03/19/2022 HBO Details Patient Name: Date of Service: [name] 03/19/2022 1:00 PM Medical Record Number: [id number] Patient Account Number: [id number] Date of Birth/Sex: Treating RN: 05/20/1961 (61 y.o. 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, Jaw HBO Treatment Details Treatment Number: 81 Patient Type: Outpatient Chamber Type: Monoplace Chamber Serial #: [id number] Treatment Protocol: 2.5 ATA with 90 minutes oxygen, with two 5 minute air breaks Treatment Details Compression Rate Down: 2.0 psi / minute De-Compression Rate Up: 2.0 psi / minute A breaks and breathing ir Compress Tx Pressure periods Decompress Decompress Begins Reached (leave unused spaces Begins Ends blank) Chamber Pressure (ATA 1 2.5 2.5 2.5 2.5 2.5 - - 2.5 1 ) Clock Time (24 hr) 12:44 12:55 13:25 13:30 14:00 14:05 - - 14:35 14:49 Treatment Length: 125 (minutes) Treatment Segments: 4 Vital Signs Capillary Blood Glucose Reference Range: 80 - 120 mg / dl HBO Diabetic Blood Glucose Intervention Range: <131 mg/dl or >249 mg/dl Time Capillary Blood Glucose Pulse Blood Respiratory Action Type: Vitals Pulse: Temperature: Glucose Oximetry Pressure: Rate: Meter #: Taken: Taken: (mg/dl): (%) Pre 12:39 133/92 81 18 98.9 none per protocol diastolic [id number], asymptomatic/patient Post 14:52 140/104 78 18 97.2 upset. Treatment Response Treatment Toleration: Well Treatment Completion Status: Treatment Completed without Adverse Event Physician HBO Attestation: I certify that I  supervised this HBO treatment in accordance with Medicare guidelines. A trained emergency response team is readily available per Yes hospital policies and procedures. Continue HBOT as ordered. Yes Electronic Signature(s) Signed: 03/19/2022 4:16:01 PM By: [name] DO Previous Signature: 03/19/2022 3:24:29 PM Version By: Donavan Burnet CHT EMT BS , , Entered By: [name] on 03/19/2022 15:38:37 -------------------------------------------------------------------------------- HBO Safety Checklist Details Patient Name: Date of Service: [name] 03/19/2022 1:00 PM Medical Record Number: [id number] Patient Account Number: [id number] Date of Birth/Sex: Treating RN: 1960/11/23 (61 y.o. [name] Primary Care Rayma Hegg: [name] Other Clinician: Donavan Burnet Referring Natale Barba: Treating Deontez Klinke/Extender: [name] Weeks in Treatment: 19 HBO Safety Checklist Items Safety Checklist Consent Form Signed Patient voided / foley secured and emptied When did you last eato 1000 Last dose of injectable or oral agent n/a Ostomy pouch emptied and vented if applicable NA All implantable devices assessed, documented and approved NA Intravenous access site secured and place port Valuables secured Linens and cotton and cotton/polyester blend (less than 51% polyester) Personal oil-based products / skin lotions / body lotions removed Wigs or hairpieces removed NA Smoking or tobacco materials removed NA Books / newspapers / magazines / loose paper removed Cologne, aftershave, perfume and deodorant removed Jewelry removed (may wrap wedding band) Make-up removed Hair care products removed Battery operated devices (external) removed Heating patches and chemical warmers removed Titanium eyewear removed NA Nail polish cured greater than 10 hours Casting material cured greater than 10 hours NA Hearing aids  removed NA Loose dentures or partials removed NA Prosthetics have been removed NA Patient demonstrates correct use of air break device (if applicable) Patient concerns have been addressed Patient grounding bracelet on and cord attached to chamber Specifics for Inpatients (complete in addition to above) Medication sheet sent with patient NA Intravenous medications needed  or due during therapy sent with patient NA Drainage tubes (e.g. nasogastric tube or chest tube secured and vented) NA Endotracheal or Tracheotomy tube secured NA Cuff deflated of air and inflated with saline NA Airway suctioned NA Notes Paper version used prior to treatment. Electronic Signature(s) Signed: 03/19/2022 2:45:03 PM By: Donavan Burnet CHT EMT BS , , Entered By: Donavan Burnet on 03/19/2022 14:45:02

## 2022-03-19 NOTE — Progress Notes (Addendum)
Theresa, Dillon ([id number]) Visit Report for 03/19/2022 Arrival Information Details Patient Name: Date of Service: [name] 03/19/2022 1:00 PM Medical Record Number: [id number] Patient Account Number: [id number] Date of Birth/Sex: Treating RN: [birth year] (61 y.o. [name] Primary Care Sarahmarie Leavey: [name] Other Clinician: Donavan Burnet Referring Destinee Taber: Treating Lilianna Case/Extender: [name] in Treatment: 58 Visit Information History Since Last Visit All ordered tests and consults were completed: Yes Patient Arrived: Ambulatory Added or deleted any medications: No Arrival Time: 12:32 Any new allergies or adverse reactions: No Accompanied By: self Had a fall or experienced change in No Transfer Assistance: None activities of daily living that may affect Patient Identification Verified: Yes risk of falls: Secondary Verification Process Completed: Yes Signs or symptoms of abuse/neglect since last visito No Patient Requires Transmission-Based Precautions: No Hospitalized since last visit: No Patient Has Alerts: No Implantable device outside of the clinic excluding No cellular tissue based products placed in the center since last visit: Pain Present Now: No Electronic Signature(s) Signed: 03/19/2022 2:43:09 PM By: Donavan Burnet CHT EMT BS , , Entered By: Donavan Burnet on 03/19/2022 14:43:09 -------------------------------------------------------------------------------- Encounter Discharge Information Details Patient Name: Date of Service: [name] Gearldine Dillon D. 03/19/2022 1:00 PM Medical Record Number: [id number] Patient Account Number: [id number] Date of Birth/Sex: Treating RN: 10-07-1960 (61 y.o. [name] Primary Care Gloria Lambertson: [name] Other Clinician: Donavan Burnet Referring Christphor Groft: Treating Talitha Dicarlo/Extender: [name] in Treatment:  16 Encounter Discharge Information Items Discharge Condition: Stable Ambulatory Status: Ambulatory Discharge Destination: Home Transportation: Private Auto Accompanied By: self Schedule Follow-up Appointment: No Clinical Summary of Care: Electronic Signature(s) Signed: 03/19/2022 3:25:16 PM By: Donavan Burnet CHT EMT BS , , Entered By: Donavan Burnet on 03/19/2022 15:25:16 -------------------------------------------------------------------------------- Vitals Details Patient Name: Date of Service: [name] 03/19/2022 1:00 PM Medical Record Number: [id number] Patient Account Number: [id number] Date of Birth/Sex: Treating RN: Dec 23, 1960 (61 y.o. [name] Primary Care [name]: [name] Other Clinician: Donavan Burnet Referring Adelyna Brockman: Treating Veleda Mun/Extender: [name] Weeks in Treatment: 19 Vital Signs Time Taken: 12:39 Temperature (F): 98.9 Height (in): 60 Pulse (bpm): 81 Weight (lbs): 88 Respiratory Rate (breaths/min): 18 Body Mass Index (BMI): 17.2 Blood Pressure (mmHg): 133/92 Reference Range: 80 - 120 mg / dl Electronic Signature(s) Signed: 03/19/2022 2:43:32 PM By: Donavan Burnet CHT EMT BS , , Entered By: Donavan Burnet on 03/19/2022 14:43:32

## 2022-03-19 NOTE — Progress Notes (Signed)
[name], [name] ([id number]) Visit Report for 03/19/2022 SuperBill Details Patient Name: Date of Service: [name] 03/19/2022 Medical Record Number: [id number] Patient Account Number: [id number] Date of Birth/Sex: Treating RN: 1960-08-04 (61 y.o. 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, 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

## 2022-03-20 ENCOUNTER — Encounter (HOSPITAL_BASED_OUTPATIENT_CLINIC_OR_DEPARTMENT_OTHER): Payer: Medicare Other | Admitting: General Surgery

## 2022-03-20 DIAGNOSIS — M879 Osteonecrosis, unspecified: Secondary | ICD-10-CM | POA: Diagnosis not present

## 2022-03-20 NOTE — Progress Notes (Signed)
[name], [name] ([id number]) Visit Report for 03/20/2022 SuperBill Details Patient Name: Date of Service: [name] 03/20/2022 Medical Record Number: [id number] Patient Account Number: [id number] Date of Birth/Sex: Treating RN: [birth year] (61 y.o. 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, 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

## 2022-03-20 NOTE — Progress Notes (Signed)
[name], [name] ([id number]) Visit Report for 03/20/2022 Arrival Information Details Patient Name: Date of Service: Theresa LLA [name] 03/20/2022 1:00 PM Medical Record Number: [id number] Patient Account Number: [id number] Date of Birth/Sex: Treating RN: 18-Oct-1960 (61 y.o. [name], [name] Primary Care Aigner Horseman: [name] Other Clinician: Donavan Burnet Referring Yusuke Beza: Treating Garland Smouse/Extender: Mathews Argyle in Treatment: 19 Visit Information History Since Last Visit All ordered tests and consults were completed: Yes Patient Arrived: Ambulatory Added or deleted any medications: No Arrival Time: 12:23 Any new allergies or adverse reactions: No Accompanied By: self Had a fall or experienced change in No Transfer Assistance: None activities of daily living that may affect Patient Identification Verified: Yes risk of falls: Secondary Verification Process Completed: Yes Signs or symptoms of abuse/neglect since last visito No Patient Requires Transmission-Based Precautions: No Hospitalized since last visit: No Patient Has Alerts: No Implantable device outside of the clinic excluding No cellular tissue based products placed in the center since last visit: Pain Present Now: No Electronic Signature(s) Signed: 03/20/2022 3:16:47 PM By: Donavan Burnet CHT EMT BS , , Entered By: Donavan Burnet on 03/20/2022 15:16:46 -------------------------------------------------------------------------------- Encounter Discharge Information Details Patient Name: Date of Service: [name] Theresa Bienenstock D. 03/20/2022 1:00 PM Medical Record Number: [id number] Patient Account Number: [id number] Date of Birth/Sex: Treating RN: [birth year] (61 y.o. [name] Primary Care Draylen Lobue: [name] Other Clinician: Donavan Burnet Referring Madissen Wyse: Treating Kadelyn Dimascio/Extender: Mathews Argyle in Treatment:  19 Encounter Discharge Information Items Discharge Condition: Stable Ambulatory Status: Ambulatory Discharge Destination: Home Transportation: Private Auto Accompanied By: self Schedule Follow-up Appointment: No Clinical Summary of Care: Electronic Signature(s) Signed: 03/20/2022 3:20:41 PM By: Donavan Burnet CHT EMT BS , , Entered By: Donavan Burnet on 03/20/2022 15:20:40 -------------------------------------------------------------------------------- Vitals Details Patient Name: Date of Service: Theresa LLA [name] 03/20/2022 1:00 PM Medical Record Number: [id number] Patient Account Number: [id number] Date of Birth/Sex: Treating RN: 1961/03/25 (61 y.o. [name] Primary Care Jolaine Fryberger: [name] Other Clinician: Donavan Burnet Referring Veleda Mun: Treating Aryanne Gilleland/Extender: Mathews Argyle in Treatment: 19 Vital Signs Time Taken: 12:39 Temperature (F): 98.8 Height (in): 60 Pulse (bpm): 91 Weight (lbs): 88 Respiratory Rate (breaths/min): 18 Body Mass Index (BMI): 17.2 Blood Pressure (mmHg): 108/83 Reference Range: 80 - 120 mg / dl Electronic Signature(s) Signed: 03/20/2022 3:17:21 PM By: Donavan Burnet CHT EMT BS , , Entered By: Donavan Burnet on 03/20/2022 15:17:21

## 2022-03-20 NOTE — Progress Notes (Addendum)
MAISEN, SCHMIT ([id number]) Visit Report for 03/20/2022 HBO Details Patient Name: Date of Service: [name] 03/20/2022 1:00 PM Medical Record Number: [id number] Patient Account Number: [id number] Date of Birth/Sex: Treating RN: [birth year] (61 y.o. 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, Jaw HBO Treatment Details Treatment Number: 82 Patient Type: Outpatient Chamber Type: Monoplace Chamber Serial #: [id number] Treatment Protocol: 2.5 ATA with 90 minutes oxygen, with two 5 minute air breaks Treatment Details Compression Rate Down: 2.0 psi / minute De-Compression Rate Up: 2.0 psi / minute A breaks and breathing ir Compress Tx Pressure periods Decompress Decompress Begins Reached (leave unused spaces Begins Ends blank) Chamber Pressure (ATA 1 2.5 2.5 2.5 2.5 2.5 - - 2.5 1 ) Clock Time (24 hr) 12:47 12:58 13:28 13:33 14:03 14:08 - - 14:38 14:47 Treatment Length: 120 (minutes) Treatment Segments: 4 Vital Signs Capillary Blood Glucose Reference Range: 80 - 120 mg / dl HBO Diabetic Blood Glucose Intervention Range: <131 mg/dl or >249 mg/dl Type: Time Vitals Blood Respiratory Capillary Blood Glucose Pulse Action Pulse: Temperature: Taken: Pressure: Rate: Glucose (mg/dl): Meter #: Oximetry (%) Taken: Pre 12:39 108/83 91 18 98.8 none per protocol Post 14:50 125/74 74 18 98.2 none per protocol Treatment Response Treatment Toleration: Well Treatment Completion Status: Treatment Completed without Adverse Event Physician HBO Attestation: I certify that I supervised this HBO treatment in accordance  with Medicare guidelines. A trained emergency response team is readily available per Yes hospital policies and procedures. Continue HBOT as ordered. Yes Electronic Signature(s) Signed: 03/20/2022 4:10:19 PM By: [name] MD FACS Previous Signature: 03/20/2022 3:19:32 PM Version By: Donavan Burnet CHT EMT BS , , Entered By: [name] on 03/20/2022 16:10:19 -------------------------------------------------------------------------------- HBO Safety Checklist Details Patient Name: Date of Service: [name] Gearldine Dillon D. 03/20/2022 1:00 PM Medical Record Number: [id number] Patient Account Number: [id number] Date of Birth/Sex: Treating RN: [birth year] (61 y.o. [name] Primary Care Yong Wahlquist: [name] Other Clinician: Donavan Burnet Referring Helina Hullum: Treating Avraj Lindroth/Extender: [name] Weeks in Treatment: 19 HBO Safety Checklist Items Safety Checklist Consent Form Signed Patient voided / foley secured and emptied When did you last eato 1000 Last dose of injectable or oral agent n/a Ostomy pouch emptied and vented if applicable NA All implantable devices assessed, documented and approved port Intravenous access site secured and place NA Valuables secured Linens and cotton and cotton/polyester blend (less than 51% polyester) Personal oil-based products / skin lotions / body lotions removed Wigs or hairpieces removed NA Smoking or tobacco materials removed NA Books / newspapers / magazines / loose paper removed Cologne, aftershave, perfume and deodorant removed Jewelry removed (may wrap wedding band) Make-up removed Hair care products removed Battery operated devices (external) removed Heating patches and chemical warmers removed Titanium eyewear removed NA Nail polish cured greater than 10 hours Casting material cured greater than 10 hours NA Hearing aids removed NA Loose dentures or partials  removed NA Prosthetics have been removed NA Patient demonstrates correct use of air break device (if applicable) Patient concerns have been addressed Patient grounding bracelet on and cord attached to chamber Specifics for Inpatients (complete in addition to above) Medication sheet sent with patient NA Intravenous medications needed  or due during therapy sent with patient NA Drainage tubes (e.g. nasogastric tube or chest tube secured and vented) NA Endotracheal or Tracheotomy tube secured NA Cuff deflated of air and inflated with saline NA Airway suctioned NA Notes Paper version used prior to treatment. Electronic Signature(s) Signed: 03/20/2022 3:18:29 PM By: Donavan Burnet CHT EMT BS , , Entered By: Donavan Burnet on 03/20/2022 15:18:28

## 2022-04-03 ENCOUNTER — Encounter (HOSPITAL_BASED_OUTPATIENT_CLINIC_OR_DEPARTMENT_OTHER): Payer: Medicare Other | Admitting: General Surgery

## 2024-01-30 DEATH — deceased
